# Patient Record
Sex: Male | Born: 1940 | Race: White | Hispanic: No | Marital: Married | Smoking: Former smoker
Health system: Southern US, Community
[De-identification: ages and names within clinical notes are randomized; demographics above are authoritative.]

## PROBLEM LIST (undated history)

## (undated) DIAGNOSIS — Z87891 Personal history of nicotine dependence: Secondary | ICD-10-CM

## (undated) DIAGNOSIS — K219 Gastro-esophageal reflux disease without esophagitis: Secondary | ICD-10-CM

## (undated) DIAGNOSIS — C3490 Malignant neoplasm of unspecified part of unspecified bronchus or lung: Secondary | ICD-10-CM

## (undated) DIAGNOSIS — K5792 Diverticulitis of intestine, part unspecified, without perforation or abscess without bleeding: Secondary | ICD-10-CM

## (undated) DIAGNOSIS — I10 Essential (primary) hypertension: Secondary | ICD-10-CM

## (undated) DIAGNOSIS — Z86010 Personal history of colonic polyps: Secondary | ICD-10-CM

## (undated) DIAGNOSIS — N419 Inflammatory disease of prostate, unspecified: Secondary | ICD-10-CM

## (undated) DIAGNOSIS — E876 Hypokalemia: Secondary | ICD-10-CM

## (undated) DIAGNOSIS — D751 Secondary polycythemia: Secondary | ICD-10-CM

## (undated) HISTORY — DX: Personal history of nicotine dependence: Z87.891

## (undated) HISTORY — DX: Secondary polycythemia: D75.1

## (undated) HISTORY — DX: Diverticulitis of intestine, part unspecified, without perforation or abscess without bleeding: K57.92

## (undated) HISTORY — DX: Hypokalemia: E87.6

## (undated) HISTORY — PX: LOBECTOMY: SHX5089

## (undated) HISTORY — DX: Personal history of colonic polyps: Z86.010

## (undated) HISTORY — PX: COLONOSCOPY: SHX174

## (undated) HISTORY — DX: Gastro-esophageal reflux disease without esophagitis: K21.9

## (undated) HISTORY — DX: Essential (primary) hypertension: I10

## (undated) HISTORY — DX: Inflammatory disease of prostate, unspecified: N41.9

## (undated) NOTE — *Deleted (*Deleted)
Physician Discharge Summary  Hector [name] ZOX:[id number] DOB: 1941-02-23 DOA: 02/16/2020  PCP: Pcp, No  Admit date: 02/16/2020 Discharge date: 02/19/2020  Admitted From: *** Disposition:  ***  Recommendations for Outpatient Follow-up:  1. Follow up with PCP in 1-2 weeks 2. Please obtain BMP/CBC in one week 3. Please follow up with your PCP on the following pending results: Unresulted Labs (From admission, onward)          Start     Ordered   02/18/20 0500  JAK2  [id number] Qual. with reflex to Exon 12  Tomorrow morning,   R        02/17/20 1458   02/18/20 0500  Basic metabolic panel  Daily,   R     Question:  Specimen collection method  Answer:  Lab=Lab collect   02/17/20 1930   02/18/20 0500  CBC with Differential/Platelet  Daily,   R     Question:  Specimen collection method  Answer:  Lab=Lab collect   02/17/20 1930   02/18/20 0500  Magnesium  Daily,   R     Question:  Specimen collection method  Answer:  Lab=Lab collect   02/17/20 1930   02/17/20 1458  Erythropoietin  Add-on,   AD       Question:  Specimen collection method  Answer:  Lab=Lab collect   02/17/20 1458           Home Health:***  Equipment/Devices:***   Discharge Condition:***  CODE STATUS:***  Diet recommendation: ***  Subjective:***  Brief/Interim Summary: ***  Discharge Diagnoses:  Principal Problem:   Right sided weakness Active Problems:   Polycythemia   Essential hypertension   Acute ischemic stroke (HCC)   Malignant neoplasm of left lung (HCC)   Cerebral thrombosis with cerebral infarction   Benign prostatic hyperplasia   Prediabetes   Right hemiparesis (HCC)   Dyslipidemia    Discharge Instructions   Allergies as of 02/19/2020   No Known Allergies   Med Rec must be completed prior to using this SMARTLINK***       No Known Allergies  Consultations: ***   Procedures/Studies: CT Angio Head W or Wo Contrast  Result Date: 02/17/2020 CLINICAL DATA:  Stroke-like  symptoms. Left facial numbness and dizziness. EXAM: CT ANGIOGRAPHY HEAD AND NECK TECHNIQUE: Multidetector CT imaging of the head and neck was performed using the standard protocol during bolus administration of intravenous contrast. Multiplanar CT image reconstructions and MIPs were obtained to evaluate the vascular anatomy. Carotid stenosis measurements (when applicable) are obtained utilizing NASCET criteria, using the distal internal carotid diameter as the denominator. CONTRAST:  OMNIPAQUE IOHEXOL 350 MG/ML SOLN COMPARISON:  None. FINDINGS: CT HEAD FINDINGS Brain: There is no mass, hemorrhage or extra-axial collection. There is generalized atrophy without lobar predilection. There is hypoattenuation of the periventricular white matter, most commonly indicating chronic ischemic microangiopathy. Skull: The visualized skull base, calvarium and extracranial soft tissues are normal. Sinuses/Orbits: No fluid levels or advanced mucosal thickening of the visualized paranasal sinuses. No mastoid or middle ear effusion. The orbits are normal. CTA NECK FINDINGS SKELETON: There is no bony spinal canal stenosis. No lytic or blastic lesion. OTHER NECK: Normal pharynx, larynx and major salivary glands. No cervical lymphadenopathy. Unremarkable thyroid gland. UPPER CHEST: No pneumothorax or pleural effusion. No nodules or masses. AORTIC ARCH: There is calcific atherosclerosis of the aortic arch. There is no aneurysm, dissection or hemodynamically significant stenosis of the visualized portion of the aorta. Conventional 3 vessel aortic branching pattern.  The visualized proximal subclavian arteries are widely patent. RIGHT CAROTID SYSTEM: Normal without aneurysm, dissection or stenosis. LEFT CAROTID SYSTEM: Normal without aneurysm, dissection or stenosis. VERTEBRAL ARTERIES: Left dominant configuration. Both origins are clearly patent. There is no dissection, occlusion or flow-limiting stenosis to the skull base (V1-V3  segments). CTA HEAD FINDINGS POSTERIOR CIRCULATION: --Vertebral arteries: Normal V4 segments. --Inferior cerebellar arteries: Normal. --Basilar artery: Normal. --Superior cerebellar arteries: Normal. --Posterior cerebral arteries (PCA): Normal. ANTERIOR CIRCULATION: --Intracranial internal carotid arteries: Atherosclerotic calcification of the internal carotid arteries at the skull base without hemodynamically significant stenosis. --Anterior cerebral arteries (ACA): Normal. Both A1 segments are present. Patent anterior communicating artery (a-comm). --Middle cerebral arteries (MCA): Normal. VENOUS SINUSES: As permitted by contrast timing, patent. ANATOMIC VARIANTS: Fetal origin of the right posterior cerebral artery. Review of the MIP images confirms the above findings. IMPRESSION: 1. No emergent large vessel occlusion or hemodynamically significant stenosis of the head or neck. 2. Chronic ischemic microangiopathy and generalized atrophy. Aortic Atherosclerosis (ICD10-I70.0). Electronically Signed   By: Deatra Robinson M.D.   On: 02/17/2020 03:48   CT Head Wo Contrast  Result Date: 02/16/2020 CLINICAL DATA:  Left facial burning and gait instability, slurred speech noted at 1200 hours EXAM: CT HEAD WITHOUT CONTRAST TECHNIQUE: Contiguous axial images were obtained from the base of the skull through the vertex without intravenous contrast. COMPARISON:  None. 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 No large CT evident area large vascular territory or cortically based infarction. No evidence of hemorrhage, hydrocephalus, extra-axial collection, visible mass lesion or mass effect. Symmetric prominence of the ventricles, cisterns and sulci compatible with parenchymal volume loss. Patchy areas of white matter hypoattenuation are most compatible with chronic  microvascular angiopathy. Vascular: Atherosclerotic calcification of the carotid siphons and intradural vertebral arteries. No hyperdense vessel. Skull: No calvarial fracture or suspicious osseous lesion. No scalp swelling or hematoma. Sinuses/Orbits: Mild thickening in the paranasal sinuses, left greater than right. No air-fluid levels are pneumatized secretions. Mastoid air cells are predominantly clear with pneumatization of the petrous apices. Included orbital structures are unremarkable. Other: None. IMPRESSION: 1. 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 2. No large CT evident area of large vascular territory or cortically based infarction or other acute intracranial abnormality. If there is persisting concern for acute infarction, MRI is more sensitive and specific for early features of ischemia. 3. Parenchymal volume loss and chronic microvascular angiopathy. 4. Intracranial atherosclerosis. Electronically Signed   By: Kreg Shropshire M.D.   On: 02/16/2020 15:33   CT Angio Neck W and/or Wo Contrast  Result Date: 02/17/2020 CLINICAL DATA:  Stroke-like symptoms. Left facial numbness and dizziness. EXAM: CT ANGIOGRAPHY HEAD AND NECK TECHNIQUE: Multidetector CT imaging of the head and neck was performed using the standard protocol during bolus administration of intravenous contrast. Multiplanar CT image reconstructions and MIPs were obtained to evaluate the vascular anatomy. Carotid stenosis measurements (when applicable) are obtained utilizing NASCET criteria, using the distal internal carotid diameter as the denominator. CONTRAST:  OMNIPAQUE IOHEXOL 350 MG/ML SOLN COMPARISON:  None. FINDINGS: CT HEAD FINDINGS Brain: There is no mass, hemorrhage or extra-axial collection. There is generalized atrophy without lobar predilection. There is hypoattenuation of the  periventricular white matter, most commonly indicating chronic ischemic microangiopathy. Skull: The visualized  skull base, calvarium and extracranial soft tissues are normal. Sinuses/Orbits: No fluid levels or advanced mucosal thickening of the visualized paranasal sinuses. No mastoid or middle ear effusion. The orbits are normal. CTA NECK FINDINGS SKELETON: There is no bony spinal canal stenosis. No lytic or blastic lesion. OTHER NECK: Normal pharynx, larynx and major salivary glands. No cervical lymphadenopathy. Unremarkable thyroid gland. UPPER CHEST: No pneumothorax or pleural effusion. No nodules or masses. AORTIC ARCH: There is calcific atherosclerosis of the aortic arch. There is no aneurysm, dissection or hemodynamically significant stenosis of the visualized portion of the aorta. Conventional 3 vessel aortic branching pattern. The visualized proximal subclavian arteries are widely patent. RIGHT CAROTID SYSTEM: Normal without aneurysm, dissection or stenosis. LEFT CAROTID SYSTEM: Normal without aneurysm, dissection or stenosis. VERTEBRAL ARTERIES: Left dominant configuration. Both origins are clearly patent. There is no dissection, occlusion or flow-limiting stenosis to the skull base (V1-V3 segments). CTA HEAD FINDINGS POSTERIOR CIRCULATION: --Vertebral arteries: Normal V4 segments. --Inferior cerebellar arteries: Normal. --Basilar artery: Normal. --Superior cerebellar arteries: Normal. --Posterior cerebral arteries (PCA): Normal. ANTERIOR CIRCULATION: --Intracranial internal carotid arteries: Atherosclerotic calcification of the internal carotid arteries at the skull base without hemodynamically significant stenosis. --Anterior cerebral arteries (ACA): Normal. Both A1 segments are present. Patent anterior communicating artery (a-comm). --Middle cerebral arteries (MCA): Normal. VENOUS SINUSES: As permitted by contrast timing, patent. ANATOMIC VARIANTS: Fetal origin of the right posterior cerebral artery.  Review of the MIP images confirms the above findings. IMPRESSION: 1. No emergent large vessel occlusion or hemodynamically significant stenosis of the head or neck. 2. Chronic ischemic microangiopathy and generalized atrophy. Aortic Atherosclerosis (ICD10-I70.0). Electronically Signed   By: Deatra Robinson M.D.   On: 02/17/2020 03:48   MR BRAIN WO CONTRAST  Result Date: 02/17/2020 CLINICAL DATA:  Right-sided weakness EXAM: MRI HEAD WITHOUT CONTRAST TECHNIQUE: Multiplanar, multiecho pulse sequences of the brain and surrounding structures were obtained without intravenous contrast. COMPARISON:  None. 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 No acute hemorrhage. Multifocal hyperintense T2-weighted signal within the white matter. There is generalized atrophy without lobar predilection. No chronic microhemorrhage. Normal midline structures. Vascular: Normal flow voids. Skull and upper cervical spine: Normal marrow signal. Sinuses/Orbits: Negative. Other: None. 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 No hemorrhage or mass effect. Electronically Signed   By: Deatra Robinson M.D.   On: 02/17/2020 19:08   DG Chest Portable 1 View  Result Date: 02/16/2020 CLINICAL DATA:  Dizziness EXAM: PORTABLE CHEST 1 VIEW COMPARISON:  None. FINDINGS: The heart size and mediastinal contours are within normal limits. Both lungs are clear. The visualized skeletal structures are unremarkable. IMPRESSION: No active disease. Electronically Signed   By: Alcide Clever M.D.   On: 02/16/2020 15:45   DG Swallowing Func-Speech Pathology  Result Date: 02/18/2020 Objective Swallowing Evaluation: Type of Study: MBS-Modified Barium Swallow Study  Patient Details Name: YANIEL LIMBAUGH MRN: [id number] Date of Birth: July 03, 1940 Today's Date: 02/18/2020 Time: SLP Start Time (ACUTE ONLY): 1106 -SLP Stop Time (ACUTE ONLY): 1113 SLP Time Calculation (min) (ACUTE ONLY): 7 min Past Medical History:  Past Medical History: Diagnosis Date . Diverticulitis 04/2012  admission [facility] . Former smoker   Smoked for 20 yrs;  Quit  25 yrs ago. Marland Kitchen GERD (gastroesophageal reflux disease)  . Hypertension  . Hypokalemia  . Lung cancer (HCC)  . Personal history of colonic adenoma 04/2009 . Polycythemia  . Prostatitis   S/P  TURP Past Surgical History: Past Surgical  History: Procedure Laterality Date . APPENDECTOMY  02/25/11 . COLONOSCOPY   . LOBECTOMY   . TRANSURETHRAL RESECTION OF PROSTATE  2004 HPI: 61 yo male adm [facility] with right sided weakness, also has polycythemia, lung cancer hx s/p VATS and lobectomy, colonic adenoma, insomnia, HTN.  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  Speech eval ordered.  Subjective: Pt awake, alert, pleasant, participative.  Daughter present for evaluation Assessment / Plan / Recommendation CHL IP CLINICAL IMPRESSIONS 02/18/2020 Clinical Impression Pt presents with grossly normal swallow function.  There was no penetration or aspiration with any consistencies trialed.  Oral phase was efficient and well controlled.  Swallow initiation was timely.  Recommend regular texture diet with thin liquids. No further ST needs for dysphagia.  SLP will sign off for swallowing. SLP Visit Diagnosis Dysphagia, unspecified (R13.10) Attention and concentration deficit following -- Frontal lobe and executive function deficit following -- Impact on safety and function No limitations   CHL IP TREATMENT RECOMMENDATION 02/18/2020 Treatment Recommendations No treatment recommended at this time   No flowsheet data found. CHL IP DIET RECOMMENDATION 02/18/2020 SLP Diet Recommendations Regular solids;Thin liquid Liquid Administration via Cup;Straw Medication Administration Whole meds with liquid Compensations Slow rate;Small sips/bites Postural Changes Seated upright at 90 degrees   CHL IP OTHER RECOMMENDATIONS 02/18/2020 Recommended Consults -- Oral  Care Recommendations Oral care BID Other Recommendations --   CHL IP FOLLOW UP RECOMMENDATIONS 02/18/2020 Follow up Recommendations None   CHL IP FREQUENCY AND DURATION 02/18/2020 Speech Therapy Frequency (ACUTE ONLY) (No Data) Treatment Duration --      CHL IP ORAL PHASE 02/18/2020 Oral Phase WFL Oral - Pudding Teaspoon -- Oral - Pudding Cup -- Oral - Honey Teaspoon -- Oral - Honey Cup -- Oral - Nectar Teaspoon -- Oral - Nectar Cup -- Oral - Nectar Straw -- Oral - Thin Teaspoon -- Oral - Thin Cup WFL Oral - Thin Straw WFL Oral - Puree WFL Oral - Mech Soft -- Oral - Regular WFL Oral - Multi-Consistency -- Oral - Pill WFL Oral Phase - Comment --  CHL IP PHARYNGEAL PHASE 02/18/2020 Pharyngeal Phase WFL Pharyngeal- Pudding Teaspoon -- Pharyngeal -- Pharyngeal- Pudding Cup -- Pharyngeal -- Pharyngeal- Honey Teaspoon -- Pharyngeal -- Pharyngeal- Honey Cup -- Pharyngeal -- Pharyngeal- Nectar Teaspoon -- Pharyngeal -- Pharyngeal- Nectar Cup -- Pharyngeal -- Pharyngeal- Nectar Straw -- Pharyngeal -- Pharyngeal- Thin Teaspoon -- Pharyngeal -- Pharyngeal- Thin Cup [facility] Pharyngeal Material does not enter airway Pharyngeal- Thin Straw WFL Pharyngeal Material does not enter airway Pharyngeal- Puree WFL Pharyngeal Material does not enter airway Pharyngeal- Mechanical Soft -- Pharyngeal -- Pharyngeal- Regular WFL Pharyngeal Material does not enter airway Pharyngeal- Multi-consistency -- Pharyngeal -- Pharyngeal- Pill WFL Pharyngeal Material does not enter airway Pharyngeal Comment --  CHL IP CERVICAL ESOPHAGEAL PHASE 02/18/2020 Cervical Esophageal Phase WFL Pudding Teaspoon -- Pudding Cup -- Honey Teaspoon -- Honey Cup -- Nectar Teaspoon -- Nectar Cup -- Nectar Straw -- Thin Teaspoon -- Thin Cup -- Thin Straw -- Puree -- Mechanical Soft -- Regular -- Multi-consistency -- Pill -- Cervical Esophageal Comment -- Kerrie Pleasure, MA, CCC-SLP Acute Rehabilitation Services Office: [phone] 02/18/2020, 12:09 PM               ECHOCARDIOGRAM COMPLETE  Result Date: 02/17/2020    ECHOCARDIOGRAM REPORT   Patient Name:   VENCIL BASNETT Date of Exam: 02/17/2020 Medical Rec #:  [id number]  Height:       71.0 in Accession #:    [id number]     Weight:       200.0 lb Date of Birth:  13-Aug-1940      BSA:          2.109 m Patient Age:    79 years       BP:           137/89 mmHg Patient Gender: M              HR:           68 bpm. Exam Location:  Inpatient Procedure: 2D Echo Indications:    right sided weakness  History:        Patient has no prior history of Echocardiogram examinations.                 Risk Factors:Former Smoker. Lung cancer.  Sonographer:    Celene Skeen RDCS (AE) Referring Phys: [phone] DAVID GIRGUIS  Sonographer Comments: limited mobility due to right sided weakness. no true parasternal window IMPRESSIONS  1. Left ventricular ejection fraction, by estimation, is 60 to 65%. The left ventricle has normal function. The left ventricle has no regional wall motion abnormalities. Left ventricular diastolic parameters were normal.  2. Right ventricular systolic function is normal. The right ventricular size is normal.  3. The mitral valve is normal in structure. No evidence of mitral valve regurgitation. No evidence of mitral stenosis.  4. The aortic valve is normal in structure. Aortic valve regurgitation is not visualized. No aortic stenosis is present.  5. The inferior vena cava is normal in size with greater than 50% respiratory variability, suggesting right atrial pressure of 3 mmHg. FINDINGS  Left Ventricle: Left ventricular ejection fraction, by estimation, is 60 to 65%. The left ventricle has normal function. The left ventricle has no regional wall motion abnormalities. The left ventricular internal cavity size was normal in size. There is  no left ventricular hypertrophy. Left ventricular diastolic parameters were normal. Normal left ventricular filling pressure. Right Ventricle: The right ventricular size is normal. No  increase in right ventricular wall thickness. Right ventricular systolic function is normal. Left Atrium: Left atrial size was normal in size. Right Atrium: Right atrial size was normal in size. Pericardium: There is no evidence of pericardial effusion. Mitral Valve: The mitral valve is normal in structure. No evidence of mitral valve regurgitation. No evidence of mitral valve stenosis. Tricuspid Valve: The tricuspid valve is normal in structure. Tricuspid valve regurgitation is not demonstrated. No evidence of tricuspid stenosis. Aortic Valve: The aortic valve is normal in structure. Aortic valve regurgitation is not visualized. No aortic stenosis is present. Pulmonic Valve: The pulmonic valve was not well visualized. Pulmonic valve regurgitation is not visualized. No evidence of pulmonic stenosis. Aorta: The aortic root is normal in size and structure. Venous: The inferior vena cava is normal in size with greater than 50% respiratory variability, suggesting right atrial pressure of 3 mmHg. IAS/Shunts: No atrial level shunt detected by color flow Doppler.  LEFT VENTRICLE PLAX 2D LVIDd:         3.50 cm  Diastology LVIDs:         2.20 cm  LV e' medial:    6.64 cm/s LV PW:         0.80 cm  LV E/e' medial:  8.6 LV IVS:        0.90 cm  LV e' lateral:   9.46 cm/s LVOT  diam:     2.40 cm  LV E/e' lateral: 6.1 LV SV:         76 LV SV Index:   36 LVOT Area:     4.52 cm  RIGHT VENTRICLE RV S prime:     10.60 cm/s TAPSE (M-mode): 1.6 cm LEFT ATRIUM         Index LA diam:    3.90 cm 1.85 cm/m  AORTIC VALVE LVOT Vmax:   82.90 cm/s LVOT Vmean:  63.100 cm/s LVOT VTI:    0.168 m  AORTA Ao Root diam: 3.10 cm MITRAL VALVE MV Area (PHT): 2.97 cm    SHUNTS MV Decel Time: 255 msec    Systemic VTI:  0.17 m MV E velocity: 57.40 cm/s  Systemic Diam: 2.40 cm MV A velocity: 58.30 cm/s MV E/A ratio:  0.98 Mihai Croitoru MD Electronically signed by Thurmon Fair MD Signature Date/Time: 02/17/2020/2:45:59 PM    Final       Discharge  Exam: Vitals:   02/18/20 2346 02/19/20 0332  BP: (!) 157/87 (!) 171/84  Pulse: (!) 59 68  Resp: 16 18  Temp: 98 F (36.7 C) 98 F (36.7 C)  SpO2: 96% 96%   Vitals:   02/18/20 1615 02/18/20 2000 02/18/20 2346 02/19/20 0332  BP: (!) 155/81 (!) 149/84 (!) 157/87 (!) 171/84  Pulse: 79 69 (!) 59 68  Resp: 18 18 16 18   Temp: 98.7 F (37.1 C) 98.2 F (36.8 C) 98 F (36.7 C) 98 F (36.7 C)  TempSrc: Oral Oral    SpO2: 97% 95% 96% 96%  Weight:      Height:        General: Pt is alert, awake, not in acute distress Cardiovascular: RRR, S1/S2 +, no rubs, no gallops Respiratory: CTA bilaterally, no wheezing, no rhonchi Abdominal: Soft, NT, ND, bowel sounds + Extremities: no edema, no cyanosis    The results of significant diagnostics from this hospitalization (including imaging, microbiology, ancillary and laboratory) are listed below for reference.     Microbiology: Recent Results (from the past 240 hour(s))  Respiratory Panel by RT PCR (Flu A&B, Covid) - Nasopharyngeal Swab     Status: None   Collection Time: 02/16/20  3:29 PM   Specimen: Nasopharyngeal Swab  Result Value Ref Range Status   SARS Coronavirus 2 by RT PCR NEGATIVE NEGATIVE Final    Comment: (NOTE) SARS-CoV-2 target nucleic acids are NOT DETECTED.  The SARS-CoV-2 RNA is generally detectable in upper respiratoy specimens during the acute phase of infection. The lowest concentration of SARS-CoV-2 viral copies this assay can detect is 131 copies/mL. A negative result does not preclude SARS-Cov-2 infection and should not be used as the sole basis for treatment or other patient management decisions. A negative result may occur with  improper specimen collection/handling, submission of specimen other than nasopharyngeal swab, presence of viral mutation(s) within the areas targeted by this assay, and inadequate number of viral copies (<131 copies/mL). A negative result must be combined with clinical observations,  patient history, and epidemiological information. The expected result is Negative.  Fact Sheet for Patients:  https://www.moore.com/  Fact Sheet for Healthcare Providers:  https://www.young.biz/  This test is no t yet approved or cleared by the [place] FDA and  has been authorized for detection and/or diagnosis of SARS-CoV-2 by FDA under an Emergency Use Authorization (EUA). This EUA will remain  in effect (meaning this test can be used) for the duration of the COVID-19 declaration under Section  564(b)(1) of the Act, 21 U.S.C. section 360bbb-3(b)(1), unless the authorization is terminated or revoked sooner.     Influenza A by PCR NEGATIVE NEGATIVE Final   Influenza B by PCR NEGATIVE NEGATIVE Final    Comment: (NOTE) The Xpert Xpress SARS-CoV-2/FLU/RSV assay is intended as an aid in  the diagnosis of influenza from Nasopharyngeal swab specimens and  should not be used as a sole basis for treatment. Nasal washings and  aspirates are unacceptable for Xpert Xpress SARS-CoV-2/FLU/RSV  testing.  Fact Sheet for Patients: https://www.moore.com/  Fact Sheet for Healthcare Providers: https://www.young.biz/  This test is not yet approved or cleared by the [place] FDA and  has been authorized for detection and/or diagnosis of SARS-CoV-2 by  FDA under an Emergency Use Authorization (EUA). This EUA will remain  in effect (meaning this test can be used) for the duration of the  Covid-19 declaration under Section 564(b)(1) of the Act, 21  U.S.C. section 360bbb-3(b)(1), unless the authorization is  terminated or revoked. Performed at [facility], [address] [place], [place] [address]   Urine culture     Status: None   Collection Time: 02/16/20  3:29 PM   Specimen: Urine, Clean Catch  Result Value Ref Range Status   Specimen Description   Final    URINE, CLEAN CATCH Performed at [facility], [address] [place], [place] [address]    Special Requests   Final    Normal Performed at [facility], [address] [place], [place] [address]    Culture   Final    NO GROWTH Performed at [facility], [address] [place], [place] [address]    Report Status 02/18/2020 FINAL  Final     Labs: BNP (last 3 results) No results for input(s): BNP in the last 8760 hours. Basic Metabolic Panel: Recent Labs  Lab 02/16/20 1458 02/17/20 1236 02/18/20 0124 02/19/20 0633  NA 137 138 139 137  K 3.7 4.1 3.9 3.7  CL 101 103 104 107  CO2 27 26 26 23   GLUCOSE 152* 139* 123* 122*  BUN 15 12 11 10   CREATININE 1.20 1.19 1.07 1.02  CALCIUM 9.6 9.5 9.2 8.9  MG  --   --  1.9 2.0   Liver Function Tests: Recent Labs  Lab 02/16/20 1458 02/17/20 1236  AST 23 22  ALT 26 29  ALKPHOS 55 48  BILITOT 0.6 0.9  PROT 7.2 6.6  ALBUMIN 4.3 4.0   No results for input(s): LIPASE, AMYLASE in the last 168 hours. No results for input(s): AMMONIA in the last 168 hours. CBC: Recent Labs  Lab 02/16/20 1458 02/17/20 1236 02/18/20 0124 02/19/20 0633  WBC 7.8 7.0 8.3 6.3  NEUTROABS 4.9  --  5.7 3.9  HGB 17.5* 17.0 16.2 16.2  HCT 51.6 50.0 47.8 47.8  MCV 93.5 91.7 91.9 90.9  PLT 217 181 187 179   Cardiac Enzymes: No results for input(s): CKTOTAL, CKMB, CKMBINDEX, TROPONINI in the last 168 hours. BNP: Invalid input(s): POCBNP CBG: No results for input(s): GLUCAP in the last 168 hours. D-Dimer No results for input(s): DDIMER in the last 72 hours. Hgb A1c Recent Labs    02/17/20 1236  HGBA1C 6.2*   Lipid Profile Recent Labs    02/17/20 1236  CHOL 148  HDL 35*  LDLCALC 77  TRIG 329*  CHOLHDL 4.2   Thyroid function studies Recent Labs    02/17/20  1236  TSH 1.444   Anemia work up Recent Labs    02/17/20 2040  VITAMINB12 514   Urinalysis    Component Value Date/Time   COLORURINE YELLOW 02/16/2020 1529   APPEARANCEUR CLEAR  02/16/2020 1529   APPEARANCEUR Clear 02/01/2016 1216   LABSPEC 1.010 02/16/2020 1529   PHURINE 6.0 02/16/2020 1529   GLUCOSEU NEGATIVE 02/16/2020 1529   HGBUR MODERATE (A) 02/16/2020 1529   BILIRUBINUR NEGATIVE 02/16/2020 1529   BILIRUBINUR Negative 02/01/2016 1216   KETONESUR NEGATIVE 02/16/2020 1529   PROTEINUR NEGATIVE 02/16/2020 1529   NITRITE NEGATIVE 02/16/2020 1529   LEUKOCYTESUR NEGATIVE 02/16/2020 1529   Sepsis Labs Invalid input(s): PROCALCITONIN,  WBC,  LACTICIDVEN Microbiology Recent Results (from the past 240 hour(s))  Respiratory Panel by RT PCR (Flu A&B, Covid) - Nasopharyngeal Swab     Status: None   Collection Time: 02/16/20  3:29 PM   Specimen: Nasopharyngeal Swab  Result Value Ref Range Status   SARS Coronavirus 2 by RT PCR NEGATIVE NEGATIVE Final    Comment: (NOTE) SARS-CoV-2 target nucleic acids are NOT DETECTED.  The SARS-CoV-2 RNA is generally detectable in upper respiratoy specimens during the acute phase of infection. The lowest concentration of SARS-CoV-2 viral copies this assay can detect is 131 copies/mL. A negative result does not preclude SARS-Cov-2 infection and should not be used as the sole basis for treatment or other patient management decisions. A negative result may occur with  improper specimen collection/handling, submission of specimen other than nasopharyngeal swab, presence of viral mutation(s) within the areas targeted by this assay, and inadequate number of viral copies (<131 copies/mL). A negative result must be combined with clinical observations, patient history, and epidemiological information. The expected result is Negative.  Fact Sheet for Patients:  https://www.moore.com/  Fact Sheet for Healthcare Providers:  https://www.young.biz/  This test is no t yet approved or cleared by the [place] FDA and  has been authorized for detection and/or diagnosis of SARS-CoV-2 by FDA under  an Emergency Use Authorization (EUA). This EUA will remain  in effect (meaning this test can be used) for the duration of the COVID-19 declaration under Section 564(b)(1) of the Act, 21 U.S.C. section 360bbb-3(b)(1), unless the authorization is terminated or revoked sooner.     Influenza A by PCR NEGATIVE NEGATIVE Final   Influenza B by PCR NEGATIVE NEGATIVE Final    Comment: (NOTE) The Xpert Xpress SARS-CoV-2/FLU/RSV assay is intended as an aid in  the diagnosis of influenza from Nasopharyngeal swab specimens and  should not be used as a sole basis for treatment. Nasal washings and  aspirates are unacceptable for Xpert Xpress SARS-CoV-2/FLU/RSV  testing.  Fact Sheet for Patients: https://www.moore.com/  Fact Sheet for Healthcare Providers: https://www.young.biz/  This test is not yet approved or cleared by the [place] FDA and  has been authorized for detection and/or diagnosis of SARS-CoV-2 by  FDA under an Emergency Use Authorization (EUA). This EUA will remain  in effect (meaning this test can be used) for the duration of the  Covid-19 declaration under Section 564(b)(1) of the Act, 21  U.S.C. section 360bbb-3(b)(1), unless the authorization is  terminated or revoked. Performed at [facility], [address] [place], [place] [address]   Urine culture     Status: None   Collection Time: 02/16/20  3:29 PM   Specimen: Urine, Clean Catch  Result Value Ref Range Status   Specimen Description   Final    URINE, CLEAN CATCH Performed  at [facility], [address] [place], [place] [address]    Special Requests   Final    Normal Performed at [facility], [address] [place], [place] [address]    Culture   Final    NO GROWTH Performed at [facility], [address], [place], [place] [address]    Report Status 02/18/2020 FINAL  Final     Time coordinating discharge: Over 30 minutes   SIGNED:   Hughie Closs, MD  [facility] 02/19/2020, 9:30 AM  If 7PM-7AM, please contact night-coverage [email or web address]

---

## 1999-03-04 ENCOUNTER — Ambulatory Visit (HOSPITAL_COMMUNITY): Admission: RE | Admit: 1999-03-04 | Discharge: 1999-03-04 | Payer: Self-pay | Admitting: Gastroenterology

## 1999-04-04 ENCOUNTER — Ambulatory Visit (HOSPITAL_COMMUNITY): Admission: RE | Admit: 1999-04-04 | Discharge: 1999-04-04 | Payer: Self-pay | Admitting: Gastroenterology

## 2002-01-08 ENCOUNTER — Encounter: Payer: Self-pay | Admitting: Urology

## 2002-01-10 ENCOUNTER — Inpatient Hospital Stay (HOSPITAL_COMMUNITY): Admission: RE | Admit: 2002-01-10 | Discharge: 2002-01-12 | Payer: Self-pay | Admitting: Urology

## 2002-01-10 ENCOUNTER — Encounter (INDEPENDENT_AMBULATORY_CARE_PROVIDER_SITE_OTHER): Payer: Self-pay | Admitting: Specialist

## 2002-05-08 HISTORY — PX: TRANSURETHRAL RESECTION OF PROSTATE: SHX73

## 2007-05-20 ENCOUNTER — Encounter
Admission: RE | Admit: 2007-05-20 | Discharge: 2007-06-13 | Payer: Self-pay | Admitting: Physical Medicine and Rehabilitation

## 2009-04-07 DIAGNOSIS — Z86010 Personal history of colonic polyps: Secondary | ICD-10-CM

## 2009-04-07 HISTORY — DX: Personal history of colonic polyps: Z86.010

## 2009-04-09 ENCOUNTER — Encounter (INDEPENDENT_AMBULATORY_CARE_PROVIDER_SITE_OTHER): Payer: Self-pay | Admitting: *Deleted

## 2009-04-12 ENCOUNTER — Ambulatory Visit: Payer: Self-pay | Admitting: Internal Medicine

## 2009-04-21 ENCOUNTER — Ambulatory Visit: Payer: Self-pay | Admitting: Internal Medicine

## 2009-04-27 ENCOUNTER — Encounter: Payer: Self-pay | Admitting: Internal Medicine

## 2009-11-19 ENCOUNTER — Ambulatory Visit: Payer: Self-pay | Admitting: Hematology and Oncology

## 2009-11-24 LAB — CBC WITH DIFFERENTIAL/PLATELET
Basophils Absolute: 0 10*3/uL (ref 0.0–0.1)
HCT: 51.6 % — ABNORMAL HIGH (ref 38.4–49.9)
HGB: 17.7 g/dL — ABNORMAL HIGH (ref 13.0–17.1)
LYMPH%: 20 % (ref 14.0–49.0)
MCH: 32.1 pg (ref 27.2–33.4)
MONO#: 0.6 10*3/uL (ref 0.1–0.9)
NEUT%: 67.8 % (ref 39.0–75.0)
Platelets: 208 10*3/uL (ref 140–400)
WBC: 7.1 10*3/uL (ref 4.0–10.3)
lymph#: 1.4 10*3/uL (ref 0.9–3.3)

## 2009-11-24 LAB — MORPHOLOGY: PLT EST: ADEQUATE

## 2009-11-26 LAB — IFE INTERPRETATION

## 2009-11-26 LAB — PROTEIN ELECTROPHORESIS, SERUM, WITH REFLEX
Albumin ELP: 60.8 % (ref 55.8–66.1)
Alpha-1-Globulin: 7 % — ABNORMAL HIGH (ref 2.9–4.9)
Alpha-2-Globulin: 8.2 % (ref 7.1–11.8)
Beta 2: 3.1 % — ABNORMAL LOW (ref 3.2–6.5)
Beta Globulin: 5.9 % (ref 4.7–7.2)
Gamma Globulin: 15 % (ref 11.1–18.8)
Total Protein, Serum Electrophoresis: 6.8 g/dL (ref 6.0–8.3)

## 2009-11-26 LAB — IMMUNOFIXATION ELECTROPHORESIS
IgA: 158 mg/dL (ref 68–378)
IgG (Immunoglobin G), Serum: 1200 mg/dL (ref 694–1618)
IgM, Serum: 94 mg/dL (ref 60–263)
Total Protein, Serum Electrophoresis: 6.8 g/dL (ref 6.0–8.3)

## 2009-11-26 LAB — COMPREHENSIVE METABOLIC PANEL
BUN: 17 mg/dL (ref 6–23)
CO2: 27 mEq/L (ref 19–32)
Calcium: 9.7 mg/dL (ref 8.4–10.5)
Chloride: 102 mEq/L (ref 96–112)
Creatinine, Ser: 1.12 mg/dL (ref 0.40–1.50)

## 2009-11-26 LAB — LACTATE DEHYDROGENASE: LDH: 167 U/L (ref 94–250)

## 2009-11-26 LAB — IGG, IGA, IGM
IgA: 161 mg/dL (ref 68–378)
IgM, Serum: 96 mg/dL (ref 60–263)

## 2009-11-26 LAB — FERRITIN: Ferritin: 207 ng/mL (ref 22–322)

## 2009-11-26 LAB — IRON AND TIBC
Iron: 124 ug/dL (ref 42–165)
UIBC: 223 ug/dL

## 2009-11-29 ENCOUNTER — Ambulatory Visit (HOSPITAL_COMMUNITY)
Admission: RE | Admit: 2009-11-29 | Discharge: 2009-11-29 | Payer: Self-pay | Source: Home / Self Care | Admitting: Hematology and Oncology

## 2009-11-29 LAB — JAK-2 V617F

## 2010-05-27 ENCOUNTER — Ambulatory Visit: Payer: Self-pay | Admitting: Hematology and Oncology

## 2010-05-29 ENCOUNTER — Encounter: Payer: Self-pay | Admitting: Hematology and Oncology

## 2010-05-31 LAB — CBC WITH DIFFERENTIAL/PLATELET
Basophils Absolute: 0 10*3/uL (ref 0.0–0.1)
Eosinophils Absolute: 0.1 10*3/uL (ref 0.0–0.5)
HGB: 18.2 g/dL — ABNORMAL HIGH (ref 13.0–17.1)
MCV: 91.9 fL (ref 79.3–98.0)
MONO%: 8.1 % (ref 0.0–14.0)
NEUT#: 5.4 10*3/uL (ref 1.5–6.5)
RBC: 5.83 10*6/uL — ABNORMAL HIGH (ref 4.20–5.82)
RDW: 13.1 % (ref 11.0–14.6)
WBC: 7.6 10*3/uL (ref 4.0–10.3)
lymph#: 1.4 10*3/uL (ref 0.9–3.3)

## 2010-06-01 LAB — BASIC METABOLIC PANEL
BUN: 17 mg/dL (ref 6–23)
Chloride: 98 mEq/L (ref 96–112)
Glucose, Bld: 169 mg/dL — ABNORMAL HIGH (ref 70–99)
Potassium: 3.5 mEq/L (ref 3.5–5.3)
Sodium: 140 mEq/L (ref 135–145)

## 2010-06-01 LAB — ERYTHROPOIETIN: Erythropoietin: 14.8 m[IU]/mL (ref 2.6–34.0)

## 2010-08-02 ENCOUNTER — Other Ambulatory Visit: Payer: Self-pay | Admitting: Hematology and Oncology

## 2010-08-02 ENCOUNTER — Encounter (HOSPITAL_BASED_OUTPATIENT_CLINIC_OR_DEPARTMENT_OTHER): Payer: Medicare Other | Admitting: Hematology and Oncology

## 2010-08-02 DIAGNOSIS — D45 Polycythemia vera: Secondary | ICD-10-CM

## 2010-08-02 DIAGNOSIS — I10 Essential (primary) hypertension: Secondary | ICD-10-CM

## 2010-08-02 LAB — CBC WITH DIFFERENTIAL/PLATELET
BASO%: 0.8 % (ref 0.0–2.0)
EOS%: 2.8 % (ref 0.0–7.0)
HCT: 49.2 % (ref 38.4–49.9)
MCH: 31.2 pg (ref 27.2–33.4)
MCHC: 34.6 g/dL (ref 32.0–36.0)
NEUT%: 61.9 % (ref 39.0–75.0)
lymph#: 1.9 10*3/uL (ref 0.9–3.3)

## 2010-09-23 NOTE — Op Note (Signed)
Hector [name], Hector [name]                          ACCOUNT NO.:  [id number]   MEDICAL RECORD NO.:  [id number]                   PATIENT TYPE:  INP   LOCATION:  0342                                 FACILITY:  [facility]   PHYSICIAN:  Jamison Neighbor, M.D.               DATE OF BIRTH:  [birth year]   DATE OF PROCEDURE:  01/10/2002  DATE OF DISCHARGE:  01/12/2002                                 OPERATIVE REPORT   SERVICE:  Urology.   PREOPERATIVE DIAGNOSES:  1. Benign prostatic hypertrophy with bladder outlet obstruction.  2. Bladder pain with possible interstitial cystitis.   POSTOPERATIVE DIAGNOSES:  Benign prostatic hypertrophy, bladder outlet  obstruction, and interstitial cystitis.   PROCEDURE:  Cystoscopy, TURP and hydrodistention of the bladder.   SURGEON:  Jamison Neighbor, M.D.   ANESTHESIA:  Spinal.   COMPLICATIONS:  None.   DRAINS:  A 24 French three way Foley catheter.   BRIEF HISTORY:  This 70 year old male has signs and symptoms of interstitial  cystitis, specifically he has urgency, frequency, and burning but  additionally he does bladder outlet symptoms. The patient is to undergo  evaluation for possible interstitial cystitis. If there is evidence of  bladder outlet obstruction, he would like to have that taken care of at the  same time. The patient understands the risks and benefits of the procedure  and gave full and informed consent.   DESCRIPTION OF PROCEDURE:  After successful induction of general anesthesia,  the patient was placed in the dorsal lithotomy position, prepped with  Betadine, draped in the usual sterile fashion. Cystoscopy was performed, and  was visualized in its entirety. Beyond the verumontanum, the patient had  trilobar hypertrophy with a large component of median lobe. He had marked  trabeculation. The bladder was distended at a pressure of 100 cm of water  for five minutes. There was no real glomerulation detected and it was felt  that his  symptoms are most consistent with bladder outlet obstruction and  most consistent with interstitial cystitis. The patient underwent resection  of the prostate, the median lobe was taken down beginning at the bladder  neck and extending out to the verumontanum. This was followed by resection  of the right lateral lobes starting at the 11 o'clock position and extending  down to the floor of the prostate. The left lateral lobe was resected in  identical fashion. All tissue irrigated from the bladder, adequate  hemostasis was obtained. At the end of the procedure, the bladder neck was  flat but had  not been undermined. The verumontanum and sphincter mechanism were intact,  the ureters had not been injured. The bladder was then drained with a three  way Foley catheter. The patient tolerated the procedure well and was taken  to the recovery room in good condition.  Jamison Neighbor, M.D.    RJE/MEDQ  D:  01/22/2002  T:  01/22/2002  Job:  [id number]

## 2010-11-08 ENCOUNTER — Other Ambulatory Visit: Payer: Self-pay | Admitting: Hematology and Oncology

## 2010-11-08 ENCOUNTER — Encounter (HOSPITAL_BASED_OUTPATIENT_CLINIC_OR_DEPARTMENT_OTHER): Payer: Medicare Other | Admitting: Hematology and Oncology

## 2010-11-08 DIAGNOSIS — I10 Essential (primary) hypertension: Secondary | ICD-10-CM

## 2010-11-08 DIAGNOSIS — D45 Polycythemia vera: Secondary | ICD-10-CM

## 2010-11-08 LAB — BASIC METABOLIC PANEL
BUN: 20 mg/dL (ref 6–23)
Creatinine, Ser: 1.26 mg/dL (ref 0.50–1.35)

## 2010-11-08 LAB — CBC WITH DIFFERENTIAL/PLATELET
Basophils Absolute: 0 10*3/uL (ref 0.0–0.1)
EOS%: 3.2 % (ref 0.0–7.0)
HCT: 50.4 % — ABNORMAL HIGH (ref 38.4–49.9)
HGB: 17.3 g/dL — ABNORMAL HIGH (ref 13.0–17.1)
MCH: 31.6 pg (ref 27.2–33.4)
MCV: 91.8 fL (ref 79.3–98.0)
MONO%: 6.6 % (ref 0.0–14.0)
NEUT%: 69.6 % (ref 39.0–75.0)

## 2011-02-06 ENCOUNTER — Other Ambulatory Visit (INDEPENDENT_AMBULATORY_CARE_PROVIDER_SITE_OTHER): Payer: Self-pay | Admitting: General Surgery

## 2011-02-06 ENCOUNTER — Inpatient Hospital Stay (HOSPITAL_COMMUNITY)
Admission: EM | Admit: 2011-02-06 | Discharge: 2011-02-09 | DRG: 340 | Disposition: A | Discharge status: Home / Self Care | Payer: Medicare Other | Attending: General Surgery | Admitting: General Surgery

## 2011-02-06 ENCOUNTER — Emergency Department (HOSPITAL_COMMUNITY): Payer: Medicare Other

## 2011-02-06 DIAGNOSIS — K429 Umbilical hernia without obstruction or gangrene: Secondary | ICD-10-CM | POA: Diagnosis present

## 2011-02-06 DIAGNOSIS — K352 Acute appendicitis with generalized peritonitis, without abscess: Secondary | ICD-10-CM

## 2011-02-06 DIAGNOSIS — E876 Hypokalemia: Secondary | ICD-10-CM | POA: Diagnosis not present

## 2011-02-06 DIAGNOSIS — Z79899 Other long term (current) drug therapy: Secondary | ICD-10-CM

## 2011-02-06 DIAGNOSIS — I4891 Unspecified atrial fibrillation: Secondary | ICD-10-CM | POA: Diagnosis present

## 2011-02-06 DIAGNOSIS — K219 Gastro-esophageal reflux disease without esophagitis: Secondary | ICD-10-CM | POA: Diagnosis present

## 2011-02-06 DIAGNOSIS — N400 Enlarged prostate without lower urinary tract symptoms: Secondary | ICD-10-CM | POA: Diagnosis present

## 2011-02-06 DIAGNOSIS — D45 Polycythemia vera: Secondary | ICD-10-CM | POA: Diagnosis present

## 2011-02-06 DIAGNOSIS — I499 Cardiac arrhythmia, unspecified: Secondary | ICD-10-CM | POA: Diagnosis present

## 2011-02-06 DIAGNOSIS — I10 Essential (primary) hypertension: Secondary | ICD-10-CM | POA: Diagnosis present

## 2011-02-06 DIAGNOSIS — K35209 Acute appendicitis with generalized peritonitis, without abscess, unspecified as to perforation: Principal | ICD-10-CM | POA: Diagnosis present

## 2011-02-06 LAB — POCT I-STAT, CHEM 8
Glucose, Bld: 123 mg/dL — ABNORMAL HIGH (ref 70–99)
HCT: 51 % (ref 39.0–52.0)
Hemoglobin: 17.3 g/dL — ABNORMAL HIGH (ref 13.0–17.0)
Potassium: 3.4 mEq/L — ABNORMAL LOW (ref 3.5–5.1)
Sodium: 135 mEq/L (ref 135–145)

## 2011-02-06 MED ORDER — IOHEXOL 300 MG/ML  SOLN
100.0000 mL | Freq: Once | INTRAMUSCULAR | Status: AC | PRN
  Administered 2011-02-06: 100 mL via INTRAVENOUS

## 2011-02-08 LAB — BASIC METABOLIC PANEL
BUN: 8 mg/dL (ref 6–23)
Calcium: 8.9 mg/dL (ref 8.4–10.5)
Creatinine, Ser: 1.13 mg/dL (ref 0.50–1.35)
GFR calc non Af Amer: 64 mL/min — ABNORMAL LOW (ref >90)
Glucose, Bld: 132 mg/dL — ABNORMAL HIGH (ref 70–99)
Sodium: 137 mEq/L (ref 135–145)

## 2011-02-08 LAB — CBC
HCT: 40.4 % (ref 39.0–52.0)
MCH: 30.3 pg (ref 26.0–34.0)
MCHC: 34.2 g/dL (ref 30.0–36.0)
MCV: 88.8 fL (ref 78.0–100.0)
Platelets: 219 10*3/uL (ref 150–400)
RDW: 12.9 % (ref 11.5–15.5)
WBC: 14 10*3/uL — ABNORMAL HIGH (ref 4.0–10.5)

## 2011-02-09 LAB — BASIC METABOLIC PANEL
Calcium: 9.2 mg/dL (ref 8.4–10.5)
GFR calc Af Amer: 73 mL/min — ABNORMAL LOW (ref >90)
GFR calc non Af Amer: 63 mL/min — ABNORMAL LOW (ref >90)
Potassium: 3.6 mEq/L (ref 3.5–5.1)
Sodium: 138 mEq/L (ref 135–145)

## 2011-02-09 LAB — CBC
MCH: 29.7 pg (ref 26.0–34.0)
MCHC: 33.1 g/dL (ref 30.0–36.0)
RDW: 12.8 % (ref 11.5–15.5)

## 2011-02-17 NOTE — Op Note (Signed)
NAMEAEMON, KOELLER                ACCOUNT NO.:  [id number]  MEDICAL RECORD NO.:  [id number]  LOCATION:  1523                         FACILITY:  [facility]  PHYSICIAN:  Lodema Pilot, MD       DATE OF BIRTH:  [birth year]  DATE OF PROCEDURE:  02/07/2011 DATE OF DISCHARGE:                              OPERATIVE REPORT   PROCEDURE:  Diagnostic laparoscopy with laparoscopic appendectomy and umbilical hernia repair.  PREOPERATIVE DIAGNOSIS:  Acute appendicitis.  POSTOPERATIVE DIAGNOSIS:  Ruptured appendicitis.  ANESTHESIA:  General endotracheal anesthesia with 25 cc of 1% lidocaine with epinephrine 0.25% Marcaine in a 50:50 mixture.  FLUID:  1700 cc crystalloid.  ESTIMATED BLOOD LOSS:  50 cc.  DRAINS:  None.  SPECIMENS:  Appendix sent to pathology for permanent sectioning.  COMPLICATIONS:  None apparent.  INDICATIONS FOR PROCEDURE:  Mr. Hartlage is a 70 year old male with 1-day history of generalized abdominal pain which is localized to the right lower quadrant.  He has a white count of 17,000 and a CT scan concerning for early acute appendicitis.  DETAILS:  Mr. Lenoir was seen and evaluated in the preoperative area, and risks and benefits of the procedure were again discussed in lay terms and informed consent was obtained.  Prophylactic antibiotics were given and the risks of procedures were discussed with the patient and informed consent was obtained.  He was taken to the operating room, placed on table in a supine position and general endotracheal anesthesia was obtained and his left arm was tucked at the side.  Foley catheter was placed and his abdomen was prepped and draped in a standard surgical fashion.  Then, a semicircular infraumbilical incision was made in the skin and dissection carried down to the subcutaneous tissue using blunt dissection.  Abdominal wall fascia was identified.  He had a small umbilical hernia.  The hernia sac was opened and the peritoneum  was entered using blunt dissection and 0 Vicryl sutures placed through the abdominal wall fascia and 12 mm Hasson trocar was placed through the hernia defect into the abdomen.  Pneumoperitoneum was obtained and there was no evidence of injury upon entry.  Then, a 5 mm left lower quadrant trocar was placed under direct visualization and the suprapubic trocar was placed under direct visualization.  The patient was positioned and the small bowel was retracted away from the right lower quadrant in the area of appendix.  There was some fibrinous exudate off to the cecum concerning for appendicitis.  The base of appendix was visualized and the base of the appendix was normal.  There were some inflammatory changes in the appendix, but I could not visualize the entire appendix at this time.  The terminal ileum was adhered to the appendix and the mesoappendix.  The appendix was grasped and retracted anteriorly and immediately upon grabbing appendix, there was some purulent material which was expressed from the necrotic portion in the midportion of the appendix.  A window was created at the mesoappendix and Endo-GIA stapler was passed through the window at the base of the mesoappendix, and the appendix was transected at the base.  The staple line appeared to be well formed and the staple  line was hemostatic.  Then, continued using a blunt dissection to continue to mobilize the appendix and a small amount of harmonic scalpel was used to divide the mesoappendix as well.  I was able to then completely to detach the appendix from the retroperitoneum and from its retrocecal position and the specimen was removed from the umbilical trocar in an EndoCatch bag.  The specimen was inspected on the table to ensure that we had removed the entire specimen and the appendix appeared to be removed in its entirety.  There was a necrotic portion at the midportion of the appendix which appeared perforated and from  which the purulent material was extruded during the dissection.  Then, the right lower quadrant was irrigated and inspected for hemostasis which was noted to be adequate and the purulent material was irrigated until the irrigation returned clear.  The remainder of the abdomen was inspected and suctioned from any free fluid.  There was no fecal contamination and it appeared to be just some purulent material from the midportion of the appendix.  Then, the left lower quadrant trocar was removed under direct visualization and abdominal wall was noted to be hemostatic and the umbilical trocar was removed and the umbilicus was elevated from the underlying fascia and the fascial edges were identified and were then approximated with interrupted 0 Vicryl sutures. The fascia was well approximated and the defect appeared well closed. Sutures were secured and then the abdomen was re-insufflated through the suprapubic trocar.  The umbilical fascial closure was inspected and appeared to be air tight and there was no evidence of bowel injury.  The abdominal wall was noted to be hemostatic and the right lower quadrant was again inspected and staple lines appeared be adequate.  Again, the right lower quadrant appeared to be hemostatic.  The suprapubic trocar was removed and the wounds were injected with a total of 25 cc of 1% lidocaine with epinephrine and 0.25%of Marcaine in a 50:50 mixture and the base of the umbilicus was tacked to the underlying fascia using two 3-0 Vicryl sutures and the skin edge were approximated with 4-0 Monocryl, subcuticular suture to all skin incisions.  The skin was washed and dried, and benzoin and Steri-Strips were applied.  Vacuum pressure dressing was of applied at the umbilicus and sterile dressings were applied at the other trocar sites.  Foley catheter was removed at the end of the case.  All sponge, needle, and instrument counts were correct at the end of the case.   The patient tolerated the procedure well.  The patient was stable and ready for transfer to the recovery room in stable condition.          ______________________________ Lodema Pilot, MD     BL/MEDQ  D:  02/07/2011  T:  02/07/2011  Job:  [id number]  Electronically Signed by Lodema Pilot DO on 02/17/2011 12:14:03 AM

## 2011-02-17 NOTE — H&P (Signed)
NAMEONTARIO, PETTENGILL                ACCOUNT NO.:  [id number]  MEDICAL RECORD NO.:  [id number]  LOCATION:  WLED                         FACILITY:  [facility]  PHYSICIAN:  Lodema Pilot, MD       DATE OF BIRTH:  [birth year]  DATE OF ADMISSION:  02/06/2011 DATE OF DISCHARGE:                             HISTORY & PHYSICAL   REQUESTING PROVIDER:  Payton Spark. Effie Shy, M.D., at the [facility] ER.  CHIEF COMPLAINT:  Abdominal pain.  HISTORY OF PRESENT ILLNESS:  Mr. Hofferber is a 70 year old male, who has been feeling well in the usual state of health until early this morning. He woke feeling normal and then around 8:30 this morning began having generalized lower abdominal discomfort, which he states progressed throughout the day and localized to the right lower quadrant.  He had some mild nausea and overall not feeling well, but no vomiting and no fevers or chills.  He states that his bowels are normal and denies any urinary symptoms.  He last ate this afternoon about 1 o'clock, because he thought eating with improve his symptoms, although he had no relief with eating.  He called his primary physician and was seen in their clinic this afternoon.  Laboratory studies were obtained at his primary physician's office, which demonstrated white blood cell count of 16.6 and he was sent for evaluation with CT scan and CT scan was obtained at [facility] and is concerning for acute appendicitis.  He states that his last colonoscopy was approximately 2 years ago, which demonstrated a polyp and he states that he is pretty active and had a normal stress test as well a few years ago.  Denies any chest pain.  ALLERGIES:  None.  MEDICATIONS:  Atenolol, indapamide, diazepam, Prevacid, and Cialis.  PAST MEDICAL HISTORY: 1. Hypertension and irregular heart beat. 2. BPH. 3. GERD.  PAST SURGICAL HISTORY:  TURP and oral surgery, but denied any abdominal surgery.  FAMILY HISTORY:  Not contributory.  SOCIAL  HISTORY:  He denies any tobacco use and drinks approximately the equivalent 2 to 3 drinks per night.  REVIEW OF SYSTEMS:  Otherwise, noncontributory except for the HPI.  PHYSICAL EXAMINATION:  GENERAL:  He is in no acute distress and nontoxic appearing, resting comfortably on the bed. VITAL SIGNS:  His temperature is 98.5, heart rate 78, blood pressure is 148/83, respiratory rate is 26.  He sats at 97% on room air. HEENT:  His head is normocephalic and atraumatic.  Sclerae are white. Mucous membranes are pink and moist.  His trachea is midline and no stridor. LUNGS:  Clear to auscultation bilaterally without wheezes or rales. HEART:  Rate is normal with a regular rhythm. ABDOMEN:  Soft and some mild right lower quadrant tenderness without any peritonitis.  He is nondistended and no masses appreciated.  He does have a small reducible umbilical hernia. EXTREMITIES:  Show normal strength and range of motion and normal pulses. SKIN:  Warm and dry with no obvious lesions.  LABORATORY STUDIES:  Performed at outside labs demonstrate, white blood cell count of 16.6, hemoglobin of 16.5, hematocrit of 47.8, platelets of 256.  CT scan of the abdomen demonstrates a thickened  enhancing appendix with some mild lymphadenopathy in the area concerning for acute appendicitis.  ASSESSMENT:  Abdominal pain, which is likely due to early acute appendicitis.  PLAN:  I discussed with the patient the CT findings, which are consistent with his abdominal exam and concerning for early acute appendicitis.  I recommended diagnostic laparoscopy with appendectomy and the risks of the procedure including infection, bleeding, pain, scarring, need for open surgery bowel injury, staple line leak, and potential need for ostomy or negative laparoscopy with the need for other procedures if other causes are found.  He expressed understanding and desires to proceed with diagnostic laparoscopy and appendectomy.   We will give him a gram of Invanz as well and taken to the operating room as soon as available for diagnostic laparoscopy and appendectomy.          ______________________________ Lodema Pilot, MD     BL/MEDQ  D:  02/06/2011  T:  02/07/2011  Job:  [id number]  Electronically Signed by Lodema Pilot DO on 02/17/2011 12:13:59 AM

## 2011-02-22 ENCOUNTER — Encounter (INDEPENDENT_AMBULATORY_CARE_PROVIDER_SITE_OTHER): Payer: Self-pay | Admitting: General Surgery

## 2011-02-22 ENCOUNTER — Ambulatory Visit (INDEPENDENT_AMBULATORY_CARE_PROVIDER_SITE_OTHER): Payer: Medicare Other | Admitting: General Surgery

## 2011-02-22 VITALS — BP 116/86 | HR 60 | Temp 97.0°F | Resp 16 | Ht 71.0 in | Wt 197.5 lb

## 2011-02-22 DIAGNOSIS — Z5189 Encounter for other specified aftercare: Secondary | ICD-10-CM

## 2011-02-22 DIAGNOSIS — Z4889 Encounter for other specified surgical aftercare: Secondary | ICD-10-CM

## 2011-02-22 NOTE — Progress Notes (Signed)
Subjective:     Patient ID: ZERIC BARANOWSKI, male   DOB: 1940-07-16, 70 y.o.   MRN: [id number]  HPI This patient is 2 weeks status post laparoscopic appendectomy and open umbilical hernia repair for perforated appendicitis. He has done very well. He was discharged on antibiotics for a week but is now off. He denies any pain or fevers. He has no significant tenderness at his incisions over his abdomen. His pathology was benign.  Review of Systems     Objective:   Physical Exam No acute distress and nontoxic-appearing  His abdomen is soft and nontender on exam his incisions are well-healed without signs of infection. There is no evidence of recurrent hernia at his umbilicus.    Assessment:     Status post arthroscopic appendectomy and open umbilical hernia repair. He is doing very well and has no complaints. Pathology is benign.    Plan:     He can follow up p.r.n. basis. He can increase his activities as tolerated.

## 2011-02-24 NOTE — Discharge Summary (Signed)
Hector [name], Hector [name]                ACCOUNT NO.:  [id number]  MEDICAL RECORD NO.:  [id number]  LOCATION:  1523                         FACILITY:  [facility]  PHYSICIAN:  Hector [name], M.D. DATE OF BIRTH:  07-10-1940  DATE OF ADMISSION:  02/06/2011 DATE OF DISCHARGE:  02/09/2011                              DISCHARGE SUMMARY   ADMISSION DIAGNOSES: 1. Acute appendicitis. 2. Hypertension with an irregular heartbeat. 3. Benign prostatic hypertrophy. 4. Gastroesophageal reflux disease.  DISCHARGE DIAGNOSES: 1. Ruptured appendix. 2. Umbilical hernia. 3. Hypertension. 4. Hypokalemia. 5. Gastroesophageal reflux disease. 6. Benign prostatic hypertrophy.  PROCEDURES:  Laparoscopic appendectomy and umbilical hernia repair on February 07, 2011, Dr. Biagio Quint.  BRIEF HISTORY:  The patient is a 70 year old gentleman who was feeling in his usual state of health, in the morning around 8:30, began having generalized abdominal discomfort that progressed through the day.  He had some mild nausea, but no vomiting, no fever and chills.  He was seen by his primary care that afternoon.  Laboratory studies showed a white count of 16.6.  A CT scan was subsequently obtained, which was suggestive of acute appendicitis.  He was seen in the emergency room at [facility] by Dr. Biagio Quint and admitted for appendicitis.  For further history and physical, please see the dictated note.  HOSPITAL COURSE:  The patient was admitted and taken to the OR in the early a.m. of February 07, 2011, at which time he underwent a laparoscopic appendectomy and umbilical hernia repair.  He tolerated the procedure well and returned to the floor in the early a.m.  That morning after surgery, his temperature was up to 101.4.  He was tender and distended. He was slowly mobilized, we continued antibiotics and ordered labs for the following a.m.  On February 08, 2011, his white count was 14,000, hemoglobin/hematocrit were stable,  his potassium was down to 2.7.  His abdomen was still distended.  He had a few bowel sounds and some flatus. He was not really tender.  We mobilized and continued antibiotics.  We replaced his potassium.  By the second postoperative morning, February 09, 2011, he felt much better.  He was eating a regular diet.  His white count was down to 10,600, hemoglobin 13.7, hematocrit 41, platelets 211,000, potassium was up to 3.6, BUN was 9, creatinine was 1.14, glucose was 131.  His incisions looked good.  The abdomen was nontender. He was passing flatus, but no bowel movement of any substance so far. At that point, it was Dr. Billey Chang opinion that he could be discharged home.  We want to keep him on a total of 10 days of antibiotics.  His antibiotics were not started till the late evening, so we are going to give him 8 more days of Augmentin 875/125 one b.i.d.  He can have Tylenol 650 q.4 p.r.n. or Percocet one to two q.4 p.r.n. for pain.  He will resume his preadmission atenolol 50 mg daily, Cialis 20 mg half tablet p.r.n., diazepam 1 tablet t.i.d. p.r.n., ibuprofen 2 tablets q.8 p.r.n., indapamide 2.5 mg 2 capsules daily, Pepto-Bismol p.r.n., Prevacid 30 mg daily.  He is to call our office for followup  appointment in 2 weeks with Dr. Biagio Quint.  He was instructed to call if he has any problems including fever, difficulty voiding, bleeding, redness or pain from his incision or abdominal pain. We also instructed him to contact Dr. Caryn Bee, his primary care at [facility] and let him check his potassium in couple weeks to make sure he is doing well when he goes back on his indapamide.     Eber Hong, P.A.   ______________________________ Hector [name], M.D.    WDJ/MEDQ  D:  02/09/2011  T:  02/09/2011  Job:  [id number]  cc:   Hector Kelp, Hector [name] Fax: [phone]  Electronically Signed by Sherrie George P.A. on 02/22/2011 01:08:00 PM Electronically Signed by Chevis Pretty III M.D. on  02/24/2011 09:07:52 AM

## 2011-02-25 HISTORY — PX: APPENDECTOMY: SHX54

## 2011-04-07 ENCOUNTER — Telehealth: Payer: Self-pay | Admitting: Hematology and Oncology

## 2011-04-07 NOTE — Telephone Encounter (Signed)
Pt lmonvm to cx 12/5 appt and r/s for late dec/early jan. Pt was on RJ's schedule and due to be moved to [place] per LO. Returned pt's call and lmonvm for new appt for 1/18 @ 11:30 am. Jan schedule mailed today.

## 2011-04-12 ENCOUNTER — Ambulatory Visit: Payer: Medicare Other | Admitting: Physician Assistant

## 2011-04-12 ENCOUNTER — Other Ambulatory Visit: Payer: Medicare Other | Admitting: Lab

## 2011-05-22 ENCOUNTER — Encounter: Payer: Self-pay | Admitting: *Deleted

## 2011-05-26 ENCOUNTER — Other Ambulatory Visit: Payer: Medicare Other | Admitting: Lab

## 2011-05-26 ENCOUNTER — Telehealth: Payer: Self-pay | Admitting: Hematology and Oncology

## 2011-05-26 ENCOUNTER — Ambulatory Visit (HOSPITAL_BASED_OUTPATIENT_CLINIC_OR_DEPARTMENT_OTHER): Payer: Medicare Other | Admitting: Hematology and Oncology

## 2011-05-26 VITALS — BP 125/79 | HR 71 | Temp 98.4°F | Ht 71.0 in | Wt 199.7 lb

## 2011-05-26 DIAGNOSIS — D751 Secondary polycythemia: Secondary | ICD-10-CM

## 2011-05-26 DIAGNOSIS — D45 Polycythemia vera: Secondary | ICD-10-CM

## 2011-05-26 DIAGNOSIS — I10 Essential (primary) hypertension: Secondary | ICD-10-CM

## 2011-05-26 LAB — CBC WITH DIFFERENTIAL/PLATELET
Basophils Absolute: 0 10*3/uL (ref 0.0–0.1)
EOS%: 2.5 % (ref 0.0–7.0)
Eosinophils Absolute: 0.2 10*3/uL (ref 0.0–0.5)
HGB: 15.5 g/dL (ref 13.0–17.1)
MCH: 28.1 pg (ref 27.2–33.4)
MCV: 84.3 fL (ref 79.3–98.0)
MONO%: 9.4 % (ref 0.0–14.0)
NEUT#: 4.4 10*3/uL (ref 1.5–6.5)
RBC: 5.51 10*6/uL (ref 4.20–5.82)
RDW: 14.4 % (ref 11.0–14.6)
lymph#: 1.7 10*3/uL (ref 0.9–3.3)

## 2011-05-26 LAB — BASIC METABOLIC PANEL
BUN: 19 mg/dL (ref 6–23)
Chloride: 98 mEq/L (ref 96–112)
Potassium: 3.4 mEq/L — ABNORMAL LOW (ref 3.5–5.3)
Sodium: 137 mEq/L (ref 135–145)

## 2011-05-26 NOTE — Telephone Encounter (Signed)
Gv pt appt for july2013 °

## 2011-05-26 NOTE — Progress Notes (Signed)
CC:   Francis P. Modesto Charon, M.D.  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  INTERIM HISTORY:  The patient was last seen 6 months ago.  He receives phlebotomies every 2 months through the [facility].  He was last phlebotomized last month.  He has no current concerns.  CBC on 05/16/2011 had a white count of 7, hemoglobin 15.5, hematocrit 46.5, platelets 235.  MEDICATIONS:  Medications reviewed and updated.  PHYSICAL EXAMINATION:  General:  The patient is a well-appearing, well- nourished man in no distress.  Vitals:  Pulse 71, blood pressure 125/79, temperature 98.4, respirations 20, weight 199 pounds.  HEENT:  Head is atraumatic.  Sclerae anicteric.  Mouth moist.  Chest/CVS:  Unremarkable. Abdomen:  Soft, nontender.  Bowel sounds present.  Extremities:  No calf tenderness.  LABORATORY DATA:  CBC as above.  CMET pending.  IMPRESSION AND PLAN:  Mr. 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  He also has renal cysts.  He gets phlebotomized through the [facility] every 2 months.  His current CBCs are within normal values.  I would like for him to continue with the current schedule.  He follows up in 6 months' time with labs.    ______________________________ Laurice Record, M.D. LIO/MEDQ  D:  05/26/2011  T:  05/26/2011  Job:  [id number]

## 2011-05-26 NOTE — Progress Notes (Signed)
This office note has been dictated.

## 2011-11-23 ENCOUNTER — Other Ambulatory Visit (HOSPITAL_BASED_OUTPATIENT_CLINIC_OR_DEPARTMENT_OTHER): Payer: Medicare Other | Admitting: Lab

## 2011-11-23 ENCOUNTER — Encounter: Payer: Self-pay | Admitting: Hematology and Oncology

## 2011-11-23 ENCOUNTER — Ambulatory Visit (HOSPITAL_BASED_OUTPATIENT_CLINIC_OR_DEPARTMENT_OTHER): Payer: Medicare Other | Admitting: Hematology and Oncology

## 2011-11-23 ENCOUNTER — Telehealth: Payer: Self-pay | Admitting: Hematology and Oncology

## 2011-11-23 VITALS — BP 132/80 | HR 72 | Temp 97.4°F | Ht 71.0 in | Wt 197.6 lb

## 2011-11-23 DIAGNOSIS — D751 Secondary polycythemia: Secondary | ICD-10-CM

## 2011-11-23 DIAGNOSIS — I10 Essential (primary) hypertension: Secondary | ICD-10-CM

## 2011-11-23 DIAGNOSIS — D45 Polycythemia vera: Secondary | ICD-10-CM

## 2011-11-23 DIAGNOSIS — Q619 Cystic kidney disease, unspecified: Secondary | ICD-10-CM

## 2011-11-23 LAB — BASIC METABOLIC PANEL
BUN: 20 mg/dL (ref 6–23)
CO2: 33 mEq/L — ABNORMAL HIGH (ref 19–32)
Calcium: 9.2 mg/dL (ref 8.4–10.5)
Creatinine, Ser: 1.28 mg/dL (ref 0.50–1.35)
Glucose, Bld: 232 mg/dL — ABNORMAL HIGH (ref 70–99)

## 2011-11-23 LAB — CBC WITH DIFFERENTIAL/PLATELET
Basophils Absolute: 0.1 10*3/uL (ref 0.0–0.1)
Eosinophils Absolute: 0.3 10*3/uL (ref 0.0–0.5)
HCT: 43.6 % (ref 38.4–49.9)
LYMPH%: 22.1 % (ref 14.0–49.0)
MCV: 81.7 fL (ref 79.3–98.0)
MONO#: 0.5 10*3/uL (ref 0.1–0.9)
MONO%: 7.9 % (ref 0.0–14.0)
NEUT#: 3.9 10*3/uL (ref 1.5–6.5)
NEUT%: 64.4 % (ref 39.0–75.0)
Platelets: 217 10*3/uL (ref 140–400)
WBC: 6 10*3/uL (ref 4.0–10.3)

## 2011-11-23 NOTE — Progress Notes (Signed)
CC:   Francis P. Modesto Charon, M.D.  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  INTERVAL HISTORY:  The patient was seen 6 months ago.  He has had no issues or concerns since his last followup visit.  He feels well.  He has been receiving phlebotomies every 2 months.  CBC on 11/23/2011 notes a white cell count of 6, hemoglobin 14.1, hematocrit 43.1, platelets 217.  MEDICATIONS:  Reviewed and updated.  PHYSICAL EXAM:  Patient is alert and oriented x3.  Vitals:  Pulse 72, blood pressure 132/80, temperature 97.4, respirations 20, weight 197 pounds.  Sclerae anicteric.  Chest:  Clear.  CVS:  Unremarkable. Abdomen:  Soft, nontender.  Bowel sounds present.  Extremities:  No edema.  LAB DATA:  CBC as above.  BMET pending.  IMPRESSION/PLAN:  Mr. Godbolt is a 71 year old man with mild polycythemia with renal cysts.  He will continue to get phlebotomized through the [facility], but I have increased the frequency to every 3 months.  I have him following up in 9 months' time.    ______________________________ Laurice Record, M.D. LIO/MEDQ  D:  11/23/2011  T:  11/23/2011  Job:  [id number]

## 2011-11-23 NOTE — Patient Instructions (Signed)
Hector [name]  [id number]  [facility] Discharge Instructions  RECOMMENDATIONS MADE BY THE CONSULTANT AND ANY TEST RESULTS WILL BE SENT TO YOUR REFERRING DOCTOR.   EXAM FINDINGS BY MD TODAY AND SIGNS AND SYMPTOMS TO REPORT TO CLINIC OR PRIMARY MD:   Your current list of medications are: Current Outpatient Prescriptions  Medication Sig Dispense Refill  . aspirin 81 MG tablet Take 81 mg by mouth daily.        Marland Kitchen atenolol (TENORMIN) 50 MG tablet Take 50 mg by mouth daily.       Marland Kitchen CIALIS 20 MG tablet as needed.       . diazepam (VALIUM) 5 MG tablet Take 5 mg by mouth daily as needed.      . indapamide (LOZOL) 2.5 MG tablet Take 5 mg by mouth daily.       . lansoprazole (PREVACID) 30 MG capsule Take 30 mg by mouth as needed.        . vitamin B-12 (CYANOCOBALAMIN) 1000 MCG tablet Take 1,000 mcg by mouth daily.           INSTRUCTIONS GIVEN AND DISCUSSED:   SPECIAL INSTRUCTIONS/FOLLOW-UP:  See above.  I acknowledge that I have been informed and understand all the instructions given to me and received a copy. I do not have any more questions at this time, but understand that I may call the [facility] at [phone] during business hours should I have any further questions or need assistance in obtaining follow-up care.

## 2011-11-23 NOTE — Progress Notes (Signed)
This office note has been dictated.

## 2011-11-23 NOTE — Telephone Encounter (Signed)
Gave pt appt date date for March 2014 lab and MD

## 2011-11-24 ENCOUNTER — Telehealth: Payer: Self-pay | Admitting: *Deleted

## 2011-11-24 NOTE — Telephone Encounter (Signed)
Faxed BMET results to pt's primary  Dr.  Leodis Sias to advise pt with low K+ level.

## 2011-11-24 NOTE — Telephone Encounter (Signed)
Dr.  Leodis Sias     Phone     [phone]     ;     Fax      [phone].

## 2011-11-24 NOTE — Telephone Encounter (Signed)
No entry 

## 2012-03-22 ENCOUNTER — Encounter: Payer: Self-pay | Admitting: Internal Medicine

## 2012-04-07 DIAGNOSIS — K5792 Diverticulitis of intestine, part unspecified, without perforation or abscess without bleeding: Secondary | ICD-10-CM

## 2012-04-07 HISTORY — DX: Diverticulitis of intestine, part unspecified, without perforation or abscess without bleeding: K57.92

## 2012-06-08 ENCOUNTER — Telehealth: Payer: Self-pay | Admitting: Hematology and Oncology

## 2012-06-26 ENCOUNTER — Encounter: Payer: Self-pay | Admitting: Internal Medicine

## 2012-07-23 ENCOUNTER — Ambulatory Visit: Payer: Medicare Other | Admitting: Hematology and Oncology

## 2012-07-23 ENCOUNTER — Other Ambulatory Visit: Payer: Medicare Other | Admitting: Lab

## 2012-07-25 ENCOUNTER — Ambulatory Visit (AMBULATORY_SURGERY_CENTER): Payer: Medicare Other | Admitting: *Deleted

## 2012-07-25 VITALS — Ht 71.0 in | Wt 198.4 lb

## 2012-07-25 DIAGNOSIS — Z1211 Encounter for screening for malignant neoplasm of colon: Secondary | ICD-10-CM

## 2012-07-25 MED ORDER — NA SULFATE-K SULFATE-MG SULF 17.5-3.13-1.6 GM/177ML PO SOLN: ORAL | Status: DC

## 2012-08-08 ENCOUNTER — Encounter: Payer: Self-pay | Admitting: Internal Medicine

## 2012-08-08 ENCOUNTER — Ambulatory Visit (AMBULATORY_SURGERY_CENTER): Payer: Medicare Other | Admitting: Internal Medicine

## 2012-08-08 VITALS — BP 127/82 | HR 61 | Temp 96.5°F | Resp 16 | Ht 71.0 in | Wt 198.0 lb

## 2012-08-08 DIAGNOSIS — D126 Benign neoplasm of colon, unspecified: Secondary | ICD-10-CM

## 2012-08-08 DIAGNOSIS — K5730 Diverticulosis of large intestine without perforation or abscess without bleeding: Secondary | ICD-10-CM

## 2012-08-08 DIAGNOSIS — Z1211 Encounter for screening for malignant neoplasm of colon: Secondary | ICD-10-CM

## 2012-08-08 DIAGNOSIS — Z860100 Personal history of colon polyps, unspecified: Secondary | ICD-10-CM

## 2012-08-08 MED ORDER — SODIUM CHLORIDE 0.9 % IV SOLN: 500.0000 mL | INTRAVENOUS | Status: DC

## 2012-08-08 NOTE — Patient Instructions (Addendum)
One tiny polyp was removed and you have diverticulosis. Next colonoscopy most likely in 5 years.  I will let you know pathology results and when to have another routine colonoscopy by mail.  Thank you for choosing me and [facility].  Iva Boop, MD, [facility]  Resume current medications.   Handouts given on polyps,diverticulosis. Call us with any questions or concerns. Thank you!!  YOU HAD AN ENDOSCOPIC PROCEDURE TODAY AT THE [facility]: Refer to the procedure report that was given to you for any specific questions about what was found during the examination.  If the procedure report does not answer your questions, please call your gastroenterologist to clarify.  If you requested that your care partner not be given the details of your procedure findings, then the procedure report has been included in a sealed envelope for you to review at your convenience later.  YOU SHOULD EXPECT: Some feelings of bloating in the abdomen. Passage of more gas than usual.  Walking can help get rid of the air that was put into your GI tract during the procedure and reduce the bloating. If you had a lower endoscopy (such as a colonoscopy or flexible sigmoidoscopy) you may notice spotting of blood in your stool or on the toilet paper. If you underwent a bowel prep for your procedure, then you may not have a normal bowel movement for a few days.  DIET: Your first meal following the procedure should be a light meal and then it is ok to progress to your normal diet.  A half-sandwich or bowl of soup is an example of a good first meal.  Heavy or fried foods are harder to digest and may make you feel nauseous or bloated.  Likewise meals heavy in dairy and vegetables can cause extra gas to form and this can also increase the bloating.  Drink plenty of fluids but you should avoid alcoholic beverages for 24 hours.  ACTIVITY: Your care partner should take you home directly after the procedure.  You  should plan to take it easy, moving slowly for the rest of the day.  You can resume normal activity the day after the procedure however you should NOT DRIVE or use heavy machinery for 24 hours (because of the sedation medicines used during the test).    SYMPTOMS TO REPORT IMMEDIATELY: A gastroenterologist can be reached at any hour.  During normal business hours, 8:30 AM to 5:00 PM Monday through Friday, call [phone].  After hours and on weekends, please call the GI answering service at [phone] who will take a message and have the physician on call contact you.   Following lower endoscopy (colonoscopy or flexible sigmoidoscopy):  Excessive amounts of blood in the stool  Significant tenderness or worsening of abdominal pains  Swelling of the abdomen that is new, acute  Fever of 100F or higher  Following upper endoscopy (EGD)  Vomiting of blood or coffee ground material  New chest pain or pain under the shoulder blades  Painful or persistently difficult swallowing  New shortness of breath  Fever of 100F or higher  Black, tarry-looking stools  FOLLOW UP: If any biopsies were taken you will be contacted by phone or by letter within the next 1-3 weeks.  Call your gastroenterologist if you have not heard about the biopsies in 3 weeks.  Our staff will call the home number listed on your records the next business day following your procedure to check on you and address any  questions or concerns that you may have at that time regarding the information given to you following your procedure. This is a courtesy call and so if there is no answer at the home number and we have not heard from you through the emergency physician on call, we will assume that you have returned to your regular daily activities without incident.  SIGNATURES/CONFIDENTIALITY: You and/or your care partner have signed paperwork which will be entered into your electronic medical record.  These signatures attest to  the fact that that the information above on your After Visit Summary has been reviewed and is understood.  Full responsibility of the confidentiality of this discharge information lies with you and/or your care-partner.

## 2012-08-08 NOTE — Progress Notes (Signed)
Patient did not experience any of the following events: a burn prior to discharge; a fall within the facility; wrong site/side/patient/procedure/implant event; or a hospital transfer or hospital admission upon discharge from the facility. (G8907) Patient did not have preoperative order for IV antibiotic SSI prophylaxis. (G8918)  

## 2012-08-08 NOTE — Progress Notes (Signed)
Called to room to assist during endoscopic procedure.  Patient ID and intended procedure confirmed with present staff. Received instructions for my participation in the procedure from the performing physician.  

## 2012-08-08 NOTE — Op Note (Signed)
[facility] [address] [place], [address]   COLONOSCOPY PROCEDURE REPORT  PATIENT: Hector [name], Hector [name]  MR#: [id number] BIRTHDATE: 1941-04-12 , 71  yrs. old GENDER: Male ENDOSCOPIST: Iva Boop, MD, [facility] PROCEDURE DATE:  08/08/2012 PROCEDURE:   Colonoscopy with biopsy ASA CLASS:   Class II INDICATIONS:Screening and surveillance,personal history of colonic polyps. MEDICATIONS: propofol (Diprivan) 200mg  IV, MAC sedation, administered by CRNA, and These medications were titrated to patient response per physician's verbal order  DESCRIPTION OF PROCEDURE:   After the risks benefits and alternatives of the procedure were thoroughly explained, informed consent was obtained.  A digital rectal exam revealed no abnormalities of the rectum, A digital rectal exam revealed no prostatic nodules, and A digital rectal exam revealed the prostate was not enlarged.   The LB [id number]-[id number]  endoscope was introduced through the anus and advanced to the cecum, which was identified by both the appendix and ileocecal valve. No adverse events experienced.   The quality of the prep was Suprep excellent The instrument was then slowly withdrawn as the colon was fully examined.      COLON FINDINGS: A polypoid shaped sessile polyp measuring 2 mm in size was found in the ascending colon.  A polypectomy was performed with cold forceps.  The resection was complete and the polyp tissue was completely retrieved.   Moderate diverticulosis was noted in the sigmoid colon.   The colon mucosa was otherwise normal.   A right colon retroflexion was performed.  Retroflexed views revealed no abnormalities. The time to cecum=2 minutes 07 seconds. Withdrawal time=13 minutes 16 seconds.  The scope was withdrawn and the procedure completed. COMPLICATIONS: There were no complications.  ENDOSCOPIC IMPRESSION: 1.   Sessile polyp measuring 2 mm in size was found in the ascending colon;  polypectomy was performed with cold forceps 2.   Moderate diverticulosis was noted in the sigmoid colon 3.   The colon mucosa was otherwise normal - excellent prep  RECOMMENDATIONS: Timing of repeat colonoscopy will be determined by pathology findings in patient with 15 mm TV adenoma removal 2010   eSigned:  Iva Boop, MD, [facility] 08/08/2012 9:41 AM  cc: Helene Kelp, PA and The Patient

## 2012-08-08 NOTE — Progress Notes (Signed)
A/o x 3 pleased with MAC report to [employer]

## 2012-08-09 ENCOUNTER — Telehealth: Payer: Self-pay | Admitting: *Deleted

## 2012-08-09 NOTE — Telephone Encounter (Signed)
  Follow up Call-  Call back number 08/08/2012  Post procedure Call Back phone  # [phone]  Permission to leave phone message Yes     Patient questions:  Do you have a fever, pain , or abdominal swelling? no Pain Score  0 *  Have you tolerated food without any problems? yes  Have you been able to return to your normal activities? yes  Do you have any questions about your discharge instructions: Diet   no Medications  no Follow up visit  no  Do you have questions or concerns about your Care? no  Actions: * If pain score is 4 or above: No action needed, pain <4.

## 2012-08-13 ENCOUNTER — Encounter: Payer: Self-pay | Admitting: Internal Medicine

## 2012-08-13 NOTE — Progress Notes (Signed)
Quick Note:  2mm adenoma Repeat colonoscopy about 08/2017 ______

## 2012-08-15 ENCOUNTER — Other Ambulatory Visit: Payer: Self-pay | Admitting: *Deleted

## 2012-08-15 MED ORDER — ATENOLOL 50 MG PO TABS: 50.0000 mg | ORAL_TABLET | Freq: Every day | ORAL | Status: DC

## 2012-08-15 MED ORDER — POTASSIUM CHLORIDE ER 10 MEQ PO TBCR: 10.0000 meq | EXTENDED_RELEASE_TABLET | Freq: Every day | ORAL | Status: DC

## 2012-08-15 NOTE — Telephone Encounter (Signed)
LAST K 4.2  ON 3/14

## 2012-11-05 ENCOUNTER — Other Ambulatory Visit: Payer: Self-pay | Admitting: Nurse Practitioner

## 2012-11-05 MED ORDER — POTASSIUM CHLORIDE ER 10 MEQ PO TBCR: 10.0000 meq | EXTENDED_RELEASE_TABLET | Freq: Every day | ORAL | Status: DC

## 2012-11-05 MED ORDER — ATENOLOL 50 MG PO TABS: 50.0000 mg | ORAL_TABLET | Freq: Every day | ORAL | Status: DC

## 2012-11-11 ENCOUNTER — Telehealth: Payer: Self-pay | Admitting: Physician Assistant

## 2012-11-11 NOTE — Telephone Encounter (Signed)
Appt given for tomorrow

## 2012-11-12 ENCOUNTER — Encounter: Payer: Self-pay | Admitting: General Practice

## 2012-11-12 ENCOUNTER — Other Ambulatory Visit: Payer: Self-pay | Admitting: Nurse Practitioner

## 2012-11-12 ENCOUNTER — Ambulatory Visit (INDEPENDENT_AMBULATORY_CARE_PROVIDER_SITE_OTHER): Payer: Medicare Other | Admitting: General Practice

## 2012-11-12 VITALS — BP 135/78 | HR 67 | Temp 99.4°F | Ht 71.0 in | Wt 200.0 lb

## 2012-11-12 DIAGNOSIS — H6690 Otitis media, unspecified, unspecified ear: Secondary | ICD-10-CM

## 2012-11-12 DIAGNOSIS — H6692 Otitis media, unspecified, left ear: Secondary | ICD-10-CM

## 2012-11-12 MED ORDER — CIPROFLOXACIN-DEXAMETHASONE 0.3-0.1 % OT SUSP: 4.0000 [drp] | Freq: Two times a day (BID) | OTIC | Status: DC

## 2012-11-12 MED ORDER — AMOXICILLIN 500 MG PO CAPS: 500.0000 mg | ORAL_CAPSULE | Freq: Three times a day (TID) | ORAL | Status: DC

## 2012-11-12 NOTE — Patient Instructions (Signed)

## 2012-11-12 NOTE — Progress Notes (Signed)
  Subjective:    Patient ID: Hector [name], male    DOB: 1940-10-26, 72 y.o.   MRN: [id number]  Otalgia  There is pain in the left ear. This is a recurrent problem. The current episode started 1 to 4 weeks ago. The problem occurs constantly. The problem has been gradually worsening. There has been no fever. The pain is at a severity of 0/10. The patient is experiencing no pain. Pertinent negatives include no coughing, drainage, ear discharge, headaches, hearing loss, rhinorrhea or sore throat. He has tried nothing for the symptoms.      Review of Systems  Constitutional: Negative for fever and chills.  HENT: Positive for ear pain. Negative for hearing loss, congestion, sore throat, rhinorrhea, sneezing, sinus pressure and ear discharge.   Respiratory: Negative for cough and chest tightness.   Neurological: Negative for dizziness, weakness and headaches.       Objective:   Physical Exam  Constitutional: He is oriented to person, place, and time. He appears well-developed and well-nourished.  HENT:  Head: Normocephalic and atraumatic.  Right Ear: External ear normal.  Left Ear: Tympanic membrane is erythematous.  Mouth/Throat: Oropharynx is clear and moist.  Cardiovascular: Normal rate, regular rhythm and normal heart sounds.   Pulmonary/Chest: Effort normal and breath sounds normal. No respiratory distress. He exhibits no tenderness.  Neurological: He is alert and oriented to person, place, and time.  Skin: Skin is warm and dry.  Psychiatric: He has a normal mood and affect.          Assessment & Plan:  1. Otitis media, left - amoxicillin (AMOXIL) 500 MG capsule; Take 1 capsule (500 mg total) by mouth 3 (three) times daily.  Dispense: 30 capsule; Refill: 0 - ciprofloxacin-dexamethasone (CIPRODEX) otic suspension; Place 4 drops into the left ear 2 (two) times daily.  Dispense: 7.5 mL; Refill: 0 -refrain from placing objects in ear -RTO if symptoms worsen or unresolved -Patient  verbalized understanding -Coralie Keens, FNP-C

## 2013-01-26 ENCOUNTER — Other Ambulatory Visit: Payer: Self-pay | Admitting: Nurse Practitioner

## 2013-02-13 ENCOUNTER — Encounter: Payer: Self-pay | Admitting: Family Medicine

## 2013-02-13 ENCOUNTER — Encounter (INDEPENDENT_AMBULATORY_CARE_PROVIDER_SITE_OTHER): Payer: Self-pay

## 2013-02-13 ENCOUNTER — Ambulatory Visit (INDEPENDENT_AMBULATORY_CARE_PROVIDER_SITE_OTHER): Payer: Medicare Other | Admitting: Family Medicine

## 2013-02-13 VITALS — BP 146/87 | HR 59 | Temp 97.9°F | Ht 71.0 in | Wt 198.0 lb

## 2013-02-13 DIAGNOSIS — E785 Hyperlipidemia, unspecified: Secondary | ICD-10-CM

## 2013-02-13 DIAGNOSIS — Z23 Encounter for immunization: Secondary | ICD-10-CM

## 2013-02-13 DIAGNOSIS — I10 Essential (primary) hypertension: Secondary | ICD-10-CM

## 2013-02-13 DIAGNOSIS — N400 Enlarged prostate without lower urinary tract symptoms: Secondary | ICD-10-CM

## 2013-02-13 DIAGNOSIS — F411 Generalized anxiety disorder: Secondary | ICD-10-CM

## 2013-02-13 DIAGNOSIS — K219 Gastro-esophageal reflux disease without esophagitis: Secondary | ICD-10-CM

## 2013-02-13 LAB — POCT CBC
Granulocyte percent: 77.7 %G (ref 37–80)
HCT, POC: 55.7 % — AB (ref 43.5–53.7)
Hemoglobin: 18.9 g/dL — AB (ref 14.1–18.1)
Lymph, poc: 1.6 (ref 0.6–3.4)
MCH, POC: 30.8 pg (ref 27–31.2)
MCHC: 33.9 g/dL (ref 31.8–35.4)
MCV: 90.8 fL (ref 80–97)
MPV: 8.6 fL (ref 0–99.8)
POC Granulocyte: 5.7 (ref 2–6.9)
POC LYMPH PERCENT: 21.2 %L (ref 10–50)
Platelet Count, POC: 193 10*3/uL (ref 142–424)
RBC: 6.1 M/uL (ref 4.69–6.13)
RDW, POC: 13 %
WBC: 7.4 10*3/uL (ref 4.6–10.2)

## 2013-02-13 MED ORDER — ATENOLOL 50 MG PO TABS: 50.0000 mg | ORAL_TABLET | Freq: Every day | ORAL | Status: DC

## 2013-02-13 MED ORDER — LANSOPRAZOLE 30 MG PO CPDR: 30.0000 mg | DELAYED_RELEASE_CAPSULE | Freq: Every day | ORAL | Status: DC

## 2013-02-13 MED ORDER — DIAZEPAM 5 MG PO TABS: 5.0000 mg | ORAL_TABLET | Freq: Every day | ORAL | Status: DC | PRN

## 2013-02-13 NOTE — Progress Notes (Signed)
  Subjective:    Patient ID: Hector [name], male    DOB: 1940-10-09, 72 y.o.   MRN: [id number]  HPI This 72 y.o. male presents for evaluation of hypertension, hyperlipidemia, diabetes,and bph. He sees a urologist.  He sees a [facility] on occasion.  He needs refills.  He is due for labs.   Review of Systems No chest pain, SOB, HA, dizziness, vision change, N/V, diarrhea, constipation, dysuria, urinary urgency or frequency, myalgias, arthralgias or rash.     Objective:   Physical Exam Vital signs noted  Well developed well nourished male.  HEENT - Head atraumatic Normocephalic                Eyes - PERRLA, Conjuctiva - clear Sclera- Clear EOMI                Ears - EAC's Wnl TM's Wnl Gross Hearing WNL                Nose - Nares patent                 Throat - oropharanx wnl Respiratory - Lungs CTA bilateral Cardiac - RRR S1 and S2 without murmur GI - Abdomen soft Nontender and bowel sounds active x 4 Extremities - No edema. Neuro - Grossly intact.       Assessment & Plan:  Other and unspecified hyperlipidemia  Anxiety state, unspecified - Plan: diazepam (VALIUM) 5 MG tablet  Essential hypertension, benign - Plan: atenolol (TENORMIN) 50 MG tablet, POCT CBC, CMP14+EGFR, Thyroid Panel With TSH  BPH (benign prostatic hyperplasia) - Plan: PSA, total and free  GERD (gastroesophageal reflux disease) - Plan: lansoprazole (PREVACID) 30 MG capsule  Need for prophylactic vaccination and inoculation against influenza  Deatra Canter FNP

## 2013-02-13 NOTE — Patient Instructions (Signed)

## 2013-02-14 ENCOUNTER — Telehealth: Payer: Self-pay | Admitting: Family Medicine

## 2013-02-14 LAB — CMP14+EGFR
ALT: 18 IU/L (ref 0–44)
AST: 18 IU/L (ref 0–40)
Albumin/Globulin Ratio: 1.9 (ref 1.1–2.5)
Albumin: 4.4 g/dL (ref 3.5–4.8)
Alkaline Phosphatase: 74 IU/L (ref 39–117)
BUN/Creatinine Ratio: 17 (ref 10–22)
BUN: 21 mg/dL (ref 8–27)
CO2: 24 mmol/L (ref 18–29)
Calcium: 9.9 mg/dL (ref 8.6–10.2)
Chloride: 97 mmol/L (ref 97–108)
Creatinine, Ser: 1.21 mg/dL (ref 0.76–1.27)
GFR calc Af Amer: 69 mL/min/{1.73_m2} (ref >59)
GFR calc non Af Amer: 59 mL/min/{1.73_m2} — ABNORMAL LOW (ref >59)
Globulin, Total: 2.3 g/dL (ref 1.5–4.5)
Glucose: 123 mg/dL — ABNORMAL HIGH (ref 65–99)
Potassium: 4 mmol/L (ref 3.5–5.2)
Sodium: 140 mmol/L (ref 134–144)
Total Bilirubin: 0.8 mg/dL (ref 0.0–1.2)
Total Protein: 6.7 g/dL (ref 6.0–8.5)

## 2013-02-14 LAB — PSA, TOTAL AND FREE
PSA, Free Pct: 28.5 %
PSA, Free: 0.37 ng/mL
PSA: 1.3 ng/mL (ref 0.0–4.0)

## 2013-02-14 LAB — THYROID PANEL WITH TSH
Free Thyroxine Index: 1.6 (ref 1.2–4.9)
T3 Uptake Ratio: 26 % (ref 24–39)
T4, Total: 6 ug/dL (ref 4.5–12.0)
TSH: 1.28 u[IU]/mL (ref 0.450–4.500)

## 2013-02-17 NOTE — Telephone Encounter (Signed)
Discussed results with patient

## 2013-02-25 ENCOUNTER — Other Ambulatory Visit: Payer: Self-pay | Admitting: Family Medicine

## 2013-05-30 ENCOUNTER — Ambulatory Visit: Payer: Medicare Other | Admitting: Urology

## 2013-06-20 ENCOUNTER — Encounter: Payer: Self-pay | Admitting: Family Medicine

## 2013-06-20 ENCOUNTER — Ambulatory Visit (INDEPENDENT_AMBULATORY_CARE_PROVIDER_SITE_OTHER): Payer: Medicare Other | Admitting: Family Medicine

## 2013-06-20 VITALS — BP 144/88 | HR 77 | Temp 99.5°F | Ht 71.0 in | Wt 201.6 lb

## 2013-06-20 DIAGNOSIS — J329 Chronic sinusitis, unspecified: Secondary | ICD-10-CM

## 2013-06-20 MED ORDER — AMOXICILLIN 875 MG PO TABS: 875.0000 mg | ORAL_TABLET | Freq: Two times a day (BID) | ORAL | Status: DC

## 2013-06-20 NOTE — Progress Notes (Signed)
   Subjective:    Patient ID: Hector [name], male    DOB: Apr 05, 1941, 73 y.o.   MRN: [id number]  HPI  This 73 y.o. male presents for evaluation of facial pain and halotosis and mucopurulent sinus drainage.  Review of Systems    No chest pain, SOB, HA, dizziness, vision change, N/V, diarrhea, constipation, dysuria, urinary urgency or frequency, myalgias, arthralgias or rash.  Objective:   Physical Exam Vital signs noted  Well developed well nourished male.  HEENT - Head atraumatic Normocephalic                Eyes - PERRLA, Conjuctiva - clear Sclera- Clear EOMI                Ears - EAC's Wnl TM's Wnl Gross Hearing WNL                Nose - Nares patent                 Throat - oropharanx wnl Respiratory - Lungs CTA bilateral Cardiac - RRR S1 and S2 without murmur GI - Abdomen soft Nontender and bowel sounds active x 4 Extremities - No edema. Neuro - Grossly intact.      Assessment & Plan:  Sinusitis - Plan: amoxicillin (AMOXIL) 875 MG tablet po bid 2 weeks Push po fluids, rest, tylenol and motrin otc prn as directed for fever, arthralgias, and myalgias.  Follow up prn if sx's continue or persist.  Lysbeth Penner FNP

## 2013-06-27 ENCOUNTER — Ambulatory Visit (INDEPENDENT_AMBULATORY_CARE_PROVIDER_SITE_OTHER): Payer: Medicare Other

## 2013-06-27 ENCOUNTER — Encounter: Payer: Self-pay | Admitting: General Practice

## 2013-06-27 ENCOUNTER — Ambulatory Visit (INDEPENDENT_AMBULATORY_CARE_PROVIDER_SITE_OTHER): Payer: Medicare Other | Admitting: General Practice

## 2013-06-27 VITALS — BP 130/80 | HR 71 | Temp 97.6°F | Ht 71.0 in | Wt 192.5 lb

## 2013-06-27 DIAGNOSIS — R5383 Other fatigue: Secondary | ICD-10-CM

## 2013-06-27 DIAGNOSIS — R5381 Other malaise: Secondary | ICD-10-CM

## 2013-06-27 DIAGNOSIS — K5900 Constipation, unspecified: Secondary | ICD-10-CM

## 2013-06-27 DIAGNOSIS — R1031 Right lower quadrant pain: Secondary | ICD-10-CM

## 2013-06-27 DIAGNOSIS — R634 Abnormal weight loss: Secondary | ICD-10-CM

## 2013-06-27 LAB — POCT CBC
Granulocyte percent: 72.2 %G (ref 37–80)
HEMATOCRIT: 53.6 % (ref 43.5–53.7)
HEMOGLOBIN: 17.6 g/dL (ref 14.1–18.1)
Lymph, poc: 1.4 (ref 0.6–3.4)
MCH: 29.5 pg (ref 27–31.2)
MCHC: 32.9 g/dL (ref 31.8–35.4)
MCV: 89.7 fL (ref 80–97)
MPV: 8.8 fL (ref 0–99.8)
POC Granulocyte: 4.3 (ref 2–6.9)
POC LYMPH PERCENT: 24 %L (ref 10–50)
Platelet Count, POC: 197 10*3/uL (ref 142–424)
RBC: 6 M/uL (ref 4.69–6.13)
RDW, POC: 13.2 %
WBC: 6 10*3/uL (ref 4.6–10.2)

## 2013-06-27 NOTE — Progress Notes (Signed)
   Subjective:    Patient ID: Hector [name], male    DOB: March 29, 1941, 73 y.o.   MRN: [id number]  HPI Patient presents today with complaints of fatigue, malaise, weight loss, loss of appetite, and fever. Reports onset of symptoms was 4 days ago. Taking amoxicillin for sinusitis since Friday. Fever of 101 on Saturday, but non since then. Diarrhea on Tuesday, 3 doses of kaeopectate.     Review of Systems  Constitutional: Negative for fever and chills.  Respiratory: Negative for chest tightness and shortness of breath.   Cardiovascular: Negative for chest pain and palpitations.  Gastrointestinal: Negative for nausea, vomiting, abdominal pain, diarrhea, constipation and blood in stool.  Genitourinary: Negative for dysuria, hematuria and difficulty urinating.  Neurological: Negative for dizziness, weakness and headaches.       Objective:   Physical Exam  Constitutional: He is oriented to person, place, and time. He appears well-developed and well-nourished.  Cardiovascular: Normal rate, regular rhythm and normal heart sounds.   Pulmonary/Chest: Effort normal and breath sounds normal. No respiratory distress. He exhibits no tenderness.  Abdominal: Soft. Bowel sounds are normal. He exhibits no distension. There is no tenderness.  Neurological: He is alert and oriented to person, place, and time.  Skin: Skin is warm and dry.  Psychiatric: He has a normal mood and affect.      [facility] reading (PRIMARY) by Erby Pian, FNP-C, moderate stool noted in colon.    Assessment & Plan:  1. Right lower quadrant pain  - DG Abd 1 View; Future  2. Fatigue   3. Malaise and fatigue  - POCT CBC - CMP14+EGFR - Vitamin B12 - Thyroid Panel With TSH  4. Loss of weight  - POCT CBC - CMP14+EGFR - Vitamin B12 - Thyroid Panel With TSH  5. Unspecified constipation -Miralax 17grams daily, for 1-4 days, until bowel movement  Increase fluid intake (water) Increase fiber in diet (fruits,  vegetables, whole grains) Take stool softner daily RTO if symptoms worsen or unresolved Patient verbalized understanding Erby Pian, FNP-C

## 2013-06-27 NOTE — Patient Instructions (Signed)
Constipation, Adult Constipation is when a person has fewer than 3 bowel movements a week; has difficulty having a bowel movement; or has stools that are dry, hard, or larger than normal. As people grow older, constipation is more common. If you try to fix constipation with medicines that make you have a bowel movement (laxatives), the problem may get worse. Long-term laxative use may cause the muscles of the colon to become weak. A low-fiber diet, not taking in enough fluids, and taking certain medicines may make constipation worse. CAUSES   Certain medicines, such as antidepressants, pain medicine, iron supplements, antacids, and water pills.   Certain diseases, such as diabetes, irritable bowel syndrome (IBS), thyroid disease, or depression.   Not drinking enough water.   Not eating enough fiber-rich foods.   Stress or travel.  Lack of physical activity or exercise.  Not going to the restroom when there is the urge to have a bowel movement.  Ignoring the urge to have a bowel movement.  Using laxatives too much. SYMPTOMS   Having fewer than 3 bowel movements a week.   Straining to have a bowel movement.   Having hard, dry, or larger than normal stools.   Feeling full or bloated.   Pain in the lower abdomen.  Not feeling relief after having a bowel movement. DIAGNOSIS  Your caregiver will take a medical history and perform a physical exam. Further testing may be done for severe constipation. Some tests may include:   A barium enema X-ray to examine your rectum, colon, and sometimes, your small intestine.  A sigmoidoscopy to examine your lower colon.  A colonoscopy to examine your entire colon. TREATMENT  Treatment will depend on the severity of your constipation and what is causing it. Some dietary treatments include drinking more fluids and eating more fiber-rich foods. Lifestyle treatments may include regular exercise. If these diet and lifestyle recommendations  do not help, your caregiver may recommend taking over-the-counter laxative medicines to help you have bowel movements. Prescription medicines may be prescribed if over-the-counter medicines do not work.  HOME CARE INSTRUCTIONS   Increase dietary fiber in your diet, such as fruits, vegetables, whole grains, and beans. Limit high-fat and processed sugars in your diet, such as [place] fries, hamburgers, cookies, candies, and soda.   A fiber supplement may be added to your diet if you cannot get enough fiber from foods.   Drink enough fluids to keep your urine clear or pale yellow.   Exercise regularly or as directed by your caregiver.   Go to the restroom when you have the urge to go. Do not hold it.  Only take medicines as directed by your caregiver. Do not take other medicines for constipation without talking to your caregiver first. [place] IF:   You have bright red blood in your stool.   Your constipation lasts for more than 4 days or gets worse.   You have abdominal or rectal pain.   You have thin, pencil-like stools.  You have unexplained weight loss. MAKE SURE YOU:   Understand these instructions.  Will watch your condition.  Will get help right away if you are not doing well or get worse. Document Released: 01/21/2004 Document Revised: 07/17/2011 Document Reviewed: 02/03/2013 [facility] Patient Information 2014 [place], [place].  Miralax 17grams daily, for 1-4 days, until bowel movement  Increase fluid intake (water) Increase fiber in diet (fruits, vegetables, whole grains) Take stool softner daily

## 2013-06-28 LAB — CMP14+EGFR
ALT: 23 IU/L (ref 0–44)
AST: 31 IU/L (ref 0–40)
Albumin/Globulin Ratio: 1.6 (ref 1.1–2.5)
Albumin: 4.1 g/dL (ref 3.5–4.8)
Alkaline Phosphatase: 55 IU/L (ref 39–117)
BILIRUBIN TOTAL: 0.7 mg/dL (ref 0.0–1.2)
BUN/Creatinine Ratio: 17 (ref 10–22)
BUN: 19 mg/dL (ref 8–27)
CO2: 30 mmol/L — ABNORMAL HIGH (ref 18–29)
CREATININE: 1.12 mg/dL (ref 0.76–1.27)
Calcium: 8.9 mg/dL (ref 8.6–10.2)
Chloride: 86 mmol/L — ABNORMAL LOW (ref 97–108)
GFR calc non Af Amer: 65 mL/min/{1.73_m2} (ref >59)
GFR, EST AFRICAN AMERICAN: 75 mL/min/{1.73_m2} (ref >59)
GLOBULIN, TOTAL: 2.6 g/dL (ref 1.5–4.5)
Glucose: 89 mg/dL (ref 65–99)
POTASSIUM: 3.4 mmol/L — AB (ref 3.5–5.2)
Sodium: 132 mmol/L — ABNORMAL LOW (ref 134–144)
Total Protein: 6.7 g/dL (ref 6.0–8.5)

## 2013-06-28 LAB — THYROID PANEL WITH TSH
FREE THYROXINE INDEX: 2.6 (ref 1.2–4.9)
T3 UPTAKE RATIO: 26 % (ref 24–39)
T4, Total: 10.1 ug/dL (ref 4.5–12.0)
TSH: 1.33 u[IU]/mL (ref 0.450–4.500)

## 2013-06-28 LAB — VITAMIN B12: Vitamin B-12: 1685 pg/mL — ABNORMAL HIGH (ref 211–946)

## 2013-07-01 ENCOUNTER — Other Ambulatory Visit: Payer: Self-pay | Admitting: General Practice

## 2013-07-01 DIAGNOSIS — E876 Hypokalemia: Secondary | ICD-10-CM

## 2013-07-08 ENCOUNTER — Other Ambulatory Visit (INDEPENDENT_AMBULATORY_CARE_PROVIDER_SITE_OTHER): Payer: Medicare Other

## 2013-07-08 DIAGNOSIS — E876 Hypokalemia: Secondary | ICD-10-CM

## 2013-07-09 LAB — CMP14+EGFR
ALT: 25 IU/L (ref 0–44)
AST: 21 IU/L (ref 0–40)
Albumin/Globulin Ratio: 2 (ref 1.1–2.5)
Albumin: 4.1 g/dL (ref 3.5–4.8)
Alkaline Phosphatase: 70 IU/L (ref 39–117)
BUN/Creatinine Ratio: 14 (ref 10–22)
BUN: 15 mg/dL (ref 8–27)
CALCIUM: 9.8 mg/dL (ref 8.6–10.2)
CHLORIDE: 99 mmol/L (ref 97–108)
CO2: 27 mmol/L (ref 18–29)
Creatinine, Ser: 1.04 mg/dL (ref 0.76–1.27)
GFR calc Af Amer: 83 mL/min/{1.73_m2} (ref >59)
GFR calc non Af Amer: 71 mL/min/{1.73_m2} (ref >59)
Globulin, Total: 2.1 g/dL (ref 1.5–4.5)
Glucose: 153 mg/dL — ABNORMAL HIGH (ref 65–99)
POTASSIUM: 4.1 mmol/L (ref 3.5–5.2)
Sodium: 140 mmol/L (ref 134–144)
Total Bilirubin: 0.5 mg/dL (ref 0.0–1.2)
Total Protein: 6.2 g/dL (ref 6.0–8.5)

## 2013-07-23 ENCOUNTER — Other Ambulatory Visit: Payer: Self-pay | Admitting: Family Medicine

## 2013-08-26 ENCOUNTER — Other Ambulatory Visit: Payer: Self-pay | Admitting: Family Medicine

## 2013-08-28 ENCOUNTER — Other Ambulatory Visit: Payer: Self-pay | Admitting: General Practice

## 2013-08-28 NOTE — Telephone Encounter (Signed)
Please phone in

## 2013-08-28 NOTE — Telephone Encounter (Signed)
Last seen 06/27/13 Mae  If approved route to nurse to call into [facility]

## 2013-08-29 ENCOUNTER — Telehealth: Payer: Self-pay | Admitting: *Deleted

## 2013-08-29 NOTE — Telephone Encounter (Signed)
Aware,rx ready.

## 2013-10-10 ENCOUNTER — Other Ambulatory Visit: Payer: Self-pay | Admitting: Nurse Practitioner

## 2013-12-03 ENCOUNTER — Other Ambulatory Visit: Payer: Self-pay | Admitting: *Deleted

## 2013-12-03 MED ORDER — INDAPAMIDE 2.5 MG PO TABS: ORAL_TABLET | ORAL | Status: DC

## 2013-12-03 MED ORDER — POTASSIUM CHLORIDE ER 10 MEQ PO TBCR: EXTENDED_RELEASE_TABLET | ORAL | Status: DC

## 2013-12-03 NOTE — Telephone Encounter (Signed)
Last ov 2/15. Last K+ level on 07/08/13 was 4.1.

## 2014-01-22 ENCOUNTER — Telehealth: Payer: Self-pay | Admitting: Family Medicine

## 2014-01-23 NOTE — Telephone Encounter (Signed)
Will refill at appt on 02/03/14, pt aware

## 2014-01-28 ENCOUNTER — Other Ambulatory Visit: Payer: Self-pay | Admitting: Family Medicine

## 2014-02-03 ENCOUNTER — Ambulatory Visit: Payer: Medicare Other

## 2014-02-03 ENCOUNTER — Ambulatory Visit: Payer: Medicare Other | Admitting: Family Medicine

## 2014-02-04 ENCOUNTER — Ambulatory Visit: Payer: Medicare Other | Admitting: Interventional Cardiology

## 2014-02-05 ENCOUNTER — Ambulatory Visit (INDEPENDENT_AMBULATORY_CARE_PROVIDER_SITE_OTHER): Payer: Medicare Other | Admitting: Family Medicine

## 2014-02-05 VITALS — BP 156/98 | HR 73 | Temp 97.8°F | Ht 71.0 in | Wt 205.0 lb

## 2014-02-05 DIAGNOSIS — F411 Generalized anxiety disorder: Secondary | ICD-10-CM

## 2014-02-05 DIAGNOSIS — Z23 Encounter for immunization: Secondary | ICD-10-CM

## 2014-02-05 MED ORDER — DIAZEPAM 5 MG PO TABS: 5.0000 mg | ORAL_TABLET | Freq: Four times a day (QID) | ORAL | Status: DC | PRN

## 2014-02-06 DIAGNOSIS — F411 Generalized anxiety disorder: Secondary | ICD-10-CM | POA: Insufficient documentation

## 2014-02-06 NOTE — Progress Notes (Signed)
   Subjective:    Patient ID: Hector [name], male    DOB: [birth year], 73 y.o.   MRN: [id number]  HPI  C/o needing refill on valium rx.  He has been on valium for years and needs refill.   Review of Systems    No chest pain, SOB, HA, dizziness, vision change, N/V, diarrhea, constipation, dysuria, urinary urgency or frequency, myalgias, arthralgias or rash.  Objective:   Physical Exam  Vital signs noted  Well developed well nourished male.  HEENT - Head atraumatic Normocephalic                Eyes - PERRLA, Conjuctiva - clear Sclera- Clear EOMI                Ears - EAC's Wnl TM's Wnl Gross Hearing WNL                Nose - Nares patent                 Throat - oropharanx wnl Respiratory - Lungs CTA bilateral Cardiac - RRR S1 and S2 without murmur GI - Abdomen soft Nontender and bowel sounds active x 4 Extremities - No edema. Neuro - Grossly intact.      Assessment & Plan:  Anxiety - Valium 5mg  one po bid prn #60w/3rf  Lysbeth Penner FNP

## 2014-03-01 ENCOUNTER — Other Ambulatory Visit: Payer: Self-pay | Admitting: Family Medicine

## 2014-04-29 ENCOUNTER — Other Ambulatory Visit: Payer: Self-pay | Admitting: Family Medicine

## 2014-06-02 ENCOUNTER — Other Ambulatory Visit: Payer: Self-pay | Admitting: Family Medicine

## 2014-07-25 ENCOUNTER — Other Ambulatory Visit: Payer: Self-pay | Admitting: Family Medicine

## 2014-08-18 ENCOUNTER — Encounter: Payer: Self-pay | Admitting: Family Medicine

## 2014-08-18 ENCOUNTER — Ambulatory Visit (INDEPENDENT_AMBULATORY_CARE_PROVIDER_SITE_OTHER): Payer: Medicare Other | Admitting: Family Medicine

## 2014-08-18 VITALS — BP 143/83 | HR 60 | Temp 97.5°F | Ht 71.0 in | Wt 203.0 lb

## 2014-08-18 DIAGNOSIS — I10 Essential (primary) hypertension: Secondary | ICD-10-CM | POA: Diagnosis not present

## 2014-08-18 DIAGNOSIS — D751 Secondary polycythemia: Secondary | ICD-10-CM

## 2014-08-18 LAB — POCT CBC
GRANULOCYTE PERCENT: 63.2 % (ref 37–80)
HCT, POC: 49.5 % (ref 43.5–53.7)
Hemoglobin: 16.8 g/dL (ref 14.1–18.1)
Lymph, poc: 2.4 (ref 0.6–3.4)
MCH: 30.5 pg (ref 27–31.2)
MCHC: 33.9 g/dL (ref 31.8–35.4)
MCV: 89.9 fL (ref 80–97)
MPV: 8.2 fL (ref 0–99.8)
POC Granulocyte: 5.2 (ref 2–6.9)
POC LYMPH PERCENT: 28.8 %L (ref 10–50)
Platelet Count, POC: 222 10*3/uL (ref 142–424)
RBC: 5.51 M/uL (ref 4.69–6.13)
RDW, POC: 14.8 %
WBC: 8.2 10*3/uL (ref 4.6–10.2)

## 2014-08-18 MED ORDER — DIAZEPAM 5 MG PO TABS: 5.0000 mg | ORAL_TABLET | Freq: Four times a day (QID) | ORAL | Status: DC | PRN

## 2014-08-18 MED ORDER — PANTOPRAZOLE SODIUM 40 MG PO TBEC: 40.0000 mg | DELAYED_RELEASE_TABLET | Freq: Every day | ORAL | Status: DC

## 2014-08-18 NOTE — Progress Notes (Signed)
Subjective:  Patient ID: Hector [name], male    DOB: 1940/12/08  Age: 74 y.o. MRN: [id number]  CC: Hypertension; Peripheral Neuropathy; and polycythemia   HPI SARP VERNIER presents for tingling in fingers and toes. Uses valium for sleep around twice a week.   follow-up of hypertension. Patient has no history of headache chest pain or shortness of breath or recent cough. Patient also denies symptoms of TIA such as numbness weakness lateralizing. Patient checks  blood pressure at home and has not had any elevated readings recently. Patient denies side effects from his medication. States taking it regularly. The polycythemia has led to hemoglobin in the 19 range. He donates blood as a method of phlebotomy based on recommendation by his hematologist. 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   He has past surgical history that includes Appendectomy (02/25/11); Transurethral resection of prostate (2004); and Colonoscopy.   His family history includes Cancer in his paternal uncle; Heart disease (age of onset: 43) in his father; Stroke (age of onset: 28) in his mother. There is no history of Colon cancer.He reports that he quit smoking about 26 years ago. He has never used smokeless tobacco. He reports that he drinks about 3.5 oz of alcohol per week. He reports that he does not use illicit drugs.  Current Outpatient Prescriptions on File Prior to Visit  Medication Sig Dispense Refill  . aspirin 81 MG tablet Take 81 mg by mouth daily.      Marland Kitchen atenolol (TENORMIN) 50 MG tablet TAKE 1 TABLET (50 MG TOTAL) BY MOUTH DAILY. 90 tablet 0  . KLOR-CON 10 10 MEQ tablet TAKE 1 TABLET EVERY DAY 90 tablet 1  . vitamin B-12 (CYANOCOBALAMIN) 1000 MCG tablet Take 1,000 mcg by mouth  daily.       No current facility-administered medications on file prior to visit.    ROS Review of Systems  Constitutional: Negative for fever, chills, diaphoresis and unexpected weight change.  HENT: Negative for congestion, hearing loss, rhinorrhea, sore throat and trouble swallowing.   Respiratory: Negative for cough, chest tightness, shortness of breath and wheezing.   Gastrointestinal: Negative for nausea, vomiting, abdominal pain, diarrhea, constipation and abdominal distention.  Endocrine: Negative for cold intolerance and heat intolerance.  Genitourinary: Negative for dysuria, hematuria and flank pain.  Musculoskeletal: Negative for joint swelling and arthralgias.  Skin: Negative for rash.  Neurological: Negative for dizziness and headaches.  Psychiatric/Behavioral: Negative for dysphoric mood, decreased concentration and agitation. The patient is not nervous/anxious.     Objective:  BP 143/83 mmHg  Pulse 60  Temp(Src) 97.5 F (36.4 C) (Oral)  Ht 5\' 11"  (1.803 m)  Wt 203 lb (92.08 kg)  BMI 28.33 kg/m2  BP Readings from Last 3 Encounters:  08/18/14 143/83  02/05/14 156/98  06/27/13 130/80    Wt Readings from Last 3 Encounters:  08/18/14 203 lb (92.08 kg)  02/05/14 205 lb (92.987 kg)  06/27/13 192 lb 8 oz (87.317 kg)     Physical Exam  Constitutional: He is oriented to person, place, and time. He appears well-developed and well-nourished. No distress.  HENT:  Head: Normocephalic and atraumatic.  Right Ear: External ear normal.  Left Ear: External ear normal.  Nose: Nose normal.  Mouth/Throat: Oropharynx is clear and moist.  Eyes: Conjunctivae and EOM are  normal. Pupils are equal, round, and reactive to light.  Neck: Normal range of motion. Neck supple. No thyromegaly present.  Cardiovascular: Normal rate, regular rhythm and normal heart sounds.   No murmur heard. Pulmonary/Chest: Effort normal and breath sounds normal. No respiratory distress. He has no  wheezes. He has no rales.  Abdominal: Soft. Bowel sounds are normal. He exhibits no distension. There is no tenderness.  Lymphadenopathy:    He has no cervical adenopathy.  Neurological: He is alert and oriented to person, place, and time. He has normal reflexes.  Skin: Skin is warm and dry.  Psychiatric: He has a normal mood and affect. His behavior is normal. Judgment and thought content normal.    No results found for: HGBA1C  Lab Results  Component Value Date   WBC 8.2 08/18/2014   HGB 16.8 08/18/2014   HCT 49.5 08/18/2014   PLT 217 11/23/2011   GLUCOSE 153* 07/08/2013   ALT 25 07/08/2013   AST 21 07/08/2013   NA 140 07/08/2013   K 4.1 07/08/2013   CL 99 07/08/2013   CREATININE 1.04 07/08/2013   BUN 15 07/08/2013   CO2 27 07/08/2013   TSH 1.330 06/27/2013   PSA 1.3 02/13/2013    Ct Abdomen Pelvis W Contrast  02/06/2011   *RADIOLOGY REPORT*  Clinical Data: Abdominal pain, elevated white blood cell count  CT ABDOMEN AND PELVIS WITH CONTRAST  Technique:  Multidetector CT imaging of the abdomen and pelvis was performed following the standard protocol during bolus administration of intravenous contrast.  Contrast: 138mL OMNIPAQUE IOHEXOL 300 MG/ML IV SOLN  Comparison: None.  Findings:  Normal hepatic contour.  Punctate calcification within the right lobe of the liver, likely the sequela of prior granulomatous infection.  Additional sub centimeter hypoattenuating lesion within the liver (image 17) is too small to accurately characterize though favored to represent a hepatic cyst.  Normal gallbladder. Hepatic vasculature is patent.  No ascites.  There is symmetric enhancement and excretion of the bilateral kidneys.  Incidental note is made of multiple left-sided parapelvic cysts.  No urinary obstruction.  No perinephric stranding. The prostate is enlarged with internal calcifications.  Incidental note is made of a tiny right-sided bladder diverticuli (image 72, series 2).  No free fluid  within the pelvis.  The bilateral adrenal glands are normal.  Normal pancreas and spleen.  The appendix is enlarged and is associated with adjacent mesenteric stranding and shoddy right lower quadrant mesenteric lymph nodes. No perforation or drainable fluid collection.  Colonic diverticulosis without evidence of diverticulitis. Ingested contrast extends to the distal small bowel.  No evidence of enteric obstruction. No pneumoperitoneum, pneumatosis or portal venous gas.  Scattered atherosclerotic calcifications of a normal caliber abdominal aorta.  The major branch vessels of the abdominal aorta, including the IMA, are patent.  No retroperitoneal, mesenteric, pelvic or inguinal lymphadenopathy.  Limited visualization of the lower thorax demonstrates linear heterogeneous opacities within the right lower lobe and inferior segment of the lingula favored to represent atelectasis.  No focal airspace opacities.  No pleural effusion.  Normal heart size.  No pericardial effusion.  No acute aggressive osseous abnormalities.  Degenerative change within the lower thoracic spine. L eft-sided mesenteric fat containing direct inguinal hernia.  IMPRESSION: 1.  Findings compatible with uncomplicated acute appendicitis.  No perforation or drainable fluid collection. 2.  Colonic diverticulosis without evidence of diverticulitis.  No enteric obstruction. 3.  Multiple left-sided parapelvic cysts.  No urinary obstruction.  Original Report Authenticated By: Jenny Reichmann  A. Ivy Lynn, M.D.   Assessment & Plan:   Rider was seen today for hypertension, peripheral neuropathy and polycythemia.  Diagnoses and all orders for this visit:  Essential hypertension Orders: -     CMP14+EGFR; Standing -     Lipid panel; Standing -     NMR, lipoprofile; Standing -     CMP14+EGFR -     Lipid panel  Polycythemia Orders: -     POCT CBC; Standing -     Thyroid Panel With TSH -     Vit D  25 hydroxy (rtn osteoporosis monitoring); Standing -      POCT CBC -     Vit D  25 hydroxy (rtn osteoporosis monitoring)  Other orders -     Discontinue: pantoprazole (PROTONIX) 40 MG tablet; Take 1 tablet (40 mg total) by mouth daily. For stomach -     diazepam (VALIUM) 5 MG tablet; Take 1 tablet (5 mg total) by mouth every 6 (six) hours as needed for anxiety. -     pantoprazole (PROTONIX) 40 MG tablet; Take 1 tablet (40 mg total) by mouth daily. For stomach   I have discontinued Mr. Cedrone indapamide and lansoprazole. I am also having him maintain his vitamin B-12, aspirin, KLOR-CON 10, atenolol, diazepam, and pantoprazole.  Meds ordered this encounter  Medications  . DISCONTD: pantoprazole (PROTONIX) 40 MG tablet    Sig: Take 1 tablet (40 mg total) by mouth daily. For stomach    Dispense:  30 tablet    Refill:  3  . diazepam (VALIUM) 5 MG tablet    Sig: Take 1 tablet (5 mg total) by mouth every 6 (six) hours as needed for anxiety.    Dispense:  60 tablet    Refill:  3  . pantoprazole (PROTONIX) 40 MG tablet    Sig: Take 1 tablet (40 mg total) by mouth daily. For stomach    Dispense:  90 tablet    Refill:  3     Follow-up: Return in about 6 months (around 02/17/2015).  Claretta Fraise, M.D.

## 2014-08-19 LAB — CMP14+EGFR
A/G RATIO: 1.8 (ref 1.1–2.5)
ALBUMIN: 4.1 g/dL (ref 3.5–4.8)
ALT: 21 IU/L (ref 0–44)
AST: 20 IU/L (ref 0–40)
Alkaline Phosphatase: 72 IU/L (ref 39–117)
BILIRUBIN TOTAL: 0.6 mg/dL (ref 0.0–1.2)
BUN/Creatinine Ratio: 11 (ref 10–22)
BUN: 13 mg/dL (ref 8–27)
CHLORIDE: 102 mmol/L (ref 97–108)
CO2: 22 mmol/L (ref 18–29)
Calcium: 9.5 mg/dL (ref 8.6–10.2)
Creatinine, Ser: 1.2 mg/dL (ref 0.76–1.27)
GFR, EST AFRICAN AMERICAN: 69 mL/min/{1.73_m2} (ref >59)
GFR, EST NON AFRICAN AMERICAN: 60 mL/min/{1.73_m2} (ref >59)
Globulin, Total: 2.3 g/dL (ref 1.5–4.5)
Glucose: 85 mg/dL (ref 65–99)
Potassium: 4.2 mmol/L (ref 3.5–5.2)
Sodium: 138 mmol/L (ref 134–144)
TOTAL PROTEIN: 6.4 g/dL (ref 6.0–8.5)

## 2014-08-19 LAB — LIPID PANEL
CHOL/HDL RATIO: 3.5 ratio (ref 0.0–5.0)
Cholesterol, Total: 138 mg/dL (ref 100–199)
HDL: 40 mg/dL (ref >39)
LDL Calculated: 68 mg/dL (ref 0–99)
Triglycerides: 151 mg/dL — ABNORMAL HIGH (ref 0–149)
VLDL CHOLESTEROL CAL: 30 mg/dL (ref 5–40)

## 2014-08-19 LAB — THYROID PANEL WITH TSH
FREE THYROXINE INDEX: 1.8 (ref 1.2–4.9)
T3 UPTAKE RATIO: 27 % (ref 24–39)
T4 TOTAL: 6.6 ug/dL (ref 4.5–12.0)
TSH: 2.21 u[IU]/mL (ref 0.450–4.500)

## 2014-08-19 LAB — VITAMIN D 25 HYDROXY (VIT D DEFICIENCY, FRACTURES): Vit D, 25-Hydroxy: 31.3 ng/mL (ref 30.0–100.0)

## 2014-08-24 NOTE — Progress Notes (Signed)
Lmtcb/ww 4/18

## 2014-08-29 ENCOUNTER — Other Ambulatory Visit: Payer: Self-pay | Admitting: Family Medicine

## 2014-10-21 ENCOUNTER — Other Ambulatory Visit: Payer: Self-pay | Admitting: Family Medicine

## 2014-11-26 ENCOUNTER — Encounter: Payer: Self-pay | Admitting: *Deleted

## 2015-01-12 ENCOUNTER — Telehealth: Payer: Self-pay | Admitting: Family Medicine

## 2015-01-12 DIAGNOSIS — Z0000 Encounter for general adult medical examination without abnormal findings: Secondary | ICD-10-CM

## 2015-01-12 DIAGNOSIS — Z125 Encounter for screening for malignant neoplasm of prostate: Secondary | ICD-10-CM

## 2015-01-12 DIAGNOSIS — E785 Hyperlipidemia, unspecified: Secondary | ICD-10-CM

## 2015-01-12 DIAGNOSIS — E559 Vitamin D deficiency, unspecified: Secondary | ICD-10-CM

## 2015-01-12 DIAGNOSIS — D751 Secondary polycythemia: Secondary | ICD-10-CM

## 2015-01-12 NOTE — Telephone Encounter (Signed)
Tests ordered 

## 2015-01-12 NOTE — Telephone Encounter (Signed)
Patient has appointment for physical on 01/26/15, wants to come in for labwork before hand.  Will you please order future labs for him and let us know when this is complete so we can let him know it is okay to come in early for labwork.

## 2015-01-13 NOTE — Telephone Encounter (Signed)
Patient aware.

## 2015-01-21 ENCOUNTER — Other Ambulatory Visit: Payer: Self-pay | Admitting: Family Medicine

## 2015-01-21 ENCOUNTER — Other Ambulatory Visit (INDEPENDENT_AMBULATORY_CARE_PROVIDER_SITE_OTHER): Payer: Medicare Other

## 2015-01-21 DIAGNOSIS — D751 Secondary polycythemia: Secondary | ICD-10-CM | POA: Diagnosis not present

## 2015-01-21 DIAGNOSIS — I10 Essential (primary) hypertension: Secondary | ICD-10-CM

## 2015-01-21 NOTE — Progress Notes (Signed)
Lab only 

## 2015-01-22 ENCOUNTER — Other Ambulatory Visit: Payer: Self-pay | Admitting: Family Medicine

## 2015-01-22 LAB — CBC WITH DIFFERENTIAL/PLATELET
Basophils Absolute: 0 10*3/uL (ref 0.0–0.2)
Basos: 1 %
EOS (ABSOLUTE): 0.3 10*3/uL (ref 0.0–0.4)
Eos: 4 %
Hematocrit: 52.6 % — ABNORMAL HIGH (ref 37.5–51.0)
Hemoglobin: 17.8 g/dL — ABNORMAL HIGH (ref 12.6–17.7)
Immature Grans (Abs): 0 10*3/uL (ref 0.0–0.1)
Immature Granulocytes: 0 %
Lymphocytes Absolute: 1.6 10*3/uL (ref 0.7–3.1)
Lymphs: 23 %
MCH: 31.2 pg (ref 26.6–33.0)
MCHC: 33.8 g/dL (ref 31.5–35.7)
MCV: 92 fL (ref 79–97)
Monocytes Absolute: 0.6 10*3/uL (ref 0.1–0.9)
Monocytes: 8 %
Neutrophils Absolute: 4.5 10*3/uL (ref 1.4–7.0)
Neutrophils: 64 %
Platelets: 176 10*3/uL (ref 150–379)
RBC: 5.7 x10E6/uL (ref 4.14–5.80)
RDW: 14.1 % (ref 12.3–15.4)
WBC: 7 10*3/uL (ref 3.4–10.8)

## 2015-01-22 LAB — CMP14+EGFR
A/G RATIO: 2 (ref 1.1–2.5)
ALBUMIN: 4 g/dL (ref 3.5–4.8)
ALT: 16 IU/L (ref 0–44)
AST: 20 IU/L (ref 0–40)
Alkaline Phosphatase: 67 IU/L (ref 39–117)
BUN/Creatinine Ratio: 13 (ref 10–22)
BUN: 15 mg/dL (ref 8–27)
Bilirubin Total: 0.6 mg/dL (ref 0.0–1.2)
CALCIUM: 9.4 mg/dL (ref 8.6–10.2)
CO2: 25 mmol/L (ref 18–29)
CREATININE: 1.12 mg/dL (ref 0.76–1.27)
Chloride: 101 mmol/L (ref 97–108)
GFR, EST AFRICAN AMERICAN: 74 mL/min/{1.73_m2} (ref >59)
GFR, EST NON AFRICAN AMERICAN: 64 mL/min/{1.73_m2} (ref >59)
GLOBULIN, TOTAL: 2 g/dL (ref 1.5–4.5)
Glucose: 112 mg/dL — ABNORMAL HIGH (ref 65–99)
Potassium: 4.5 mmol/L (ref 3.5–5.2)
SODIUM: 141 mmol/L (ref 134–144)
TOTAL PROTEIN: 6 g/dL (ref 6.0–8.5)

## 2015-01-22 LAB — LIPID PANEL
CHOL/HDL RATIO: 3.3 ratio (ref 0.0–5.0)
Cholesterol, Total: 130 mg/dL (ref 100–199)
HDL: 40 mg/dL (ref >39)
LDL Calculated: 64 mg/dL (ref 0–99)
TRIGLYCERIDES: 129 mg/dL (ref 0–149)
VLDL Cholesterol Cal: 26 mg/dL (ref 5–40)

## 2015-01-22 LAB — VITAMIN D 25 HYDROXY (VIT D DEFICIENCY, FRACTURES): VIT D 25 HYDROXY: 33.9 ng/mL (ref 30.0–100.0)

## 2015-01-26 ENCOUNTER — Encounter: Payer: Self-pay | Admitting: Family Medicine

## 2015-01-26 ENCOUNTER — Ambulatory Visit (INDEPENDENT_AMBULATORY_CARE_PROVIDER_SITE_OTHER): Payer: Medicare Other | Admitting: Family Medicine

## 2015-01-26 VITALS — BP 131/79 | HR 67 | Temp 97.4°F | Ht 70.0 in | Wt 196.8 lb

## 2015-01-26 DIAGNOSIS — G4700 Insomnia, unspecified: Secondary | ICD-10-CM

## 2015-01-26 DIAGNOSIS — H60392 Other infective otitis externa, left ear: Secondary | ICD-10-CM | POA: Diagnosis not present

## 2015-01-26 DIAGNOSIS — Z23 Encounter for immunization: Secondary | ICD-10-CM | POA: Diagnosis not present

## 2015-01-26 DIAGNOSIS — Z1212 Encounter for screening for malignant neoplasm of rectum: Secondary | ICD-10-CM

## 2015-01-26 DIAGNOSIS — R202 Paresthesia of skin: Secondary | ICD-10-CM | POA: Diagnosis not present

## 2015-01-26 DIAGNOSIS — Z0000 Encounter for general adult medical examination without abnormal findings: Secondary | ICD-10-CM

## 2015-01-26 DIAGNOSIS — I10 Essential (primary) hypertension: Secondary | ICD-10-CM

## 2015-01-26 MED ORDER — CIPROFLOXACIN-HYDROCORTISONE 0.2-1 % OT SUSP: 3.0000 [drp] | Freq: Two times a day (BID) | OTIC | Status: DC

## 2015-01-26 MED ORDER — PANTOPRAZOLE SODIUM 40 MG PO TBEC: 40.0000 mg | DELAYED_RELEASE_TABLET | Freq: Every day | ORAL | Status: DC

## 2015-01-26 MED ORDER — POTASSIUM CHLORIDE ER 10 MEQ PO TBCR: 10.0000 meq | EXTENDED_RELEASE_TABLET | Freq: Every day | ORAL | Status: DC

## 2015-01-26 MED ORDER — TRAZODONE HCL 150 MG PO TABS: 150.0000 mg | ORAL_TABLET | Freq: Every day | ORAL | Status: DC

## 2015-01-26 MED ORDER — ATENOLOL 50 MG PO TABS: 50.0000 mg | ORAL_TABLET | Freq: Every day | ORAL | Status: DC

## 2015-01-26 NOTE — Progress Notes (Signed)
Subjective:  Patient ID: Hector [name], male    DOB: 07/09/1940  Age: 74 y.o. MRN: [id number]  CC: Annual Exam   HPI Hector [name] presents for neuropathy still present in feet for 6 yrs. Hands for 1 yr. states they tingle. Denies weakness. No known triggers such as heat or cold. It is intermittent it is mild to moderate.  Left ear has recurrent drainage that plugs the canal about once a year.  Using diazepam twice weekly or less for sleep. Denies anxiety.    follow-up of hypertension. Patient has no history of headache chest pain or shortness of breath or recent cough. Patient also denies symptoms of TIA such as numbness weakness lateralizing. Patient checks  blood pressure at home and has not had any elevated readings recently. Patient denies side effects from his medication. States taking it regularly.  Patient in for follow-up of GERD. Currently asymptomatic taking  PPI daily. There is no chest pain or heartburn. No hematemesis and no melena. No dysphagia or choking. Onset is remote. Progression is stable. Complicating factors, none.   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   He has past surgical history that includes Appendectomy (02/25/11); Transurethral resection of prostate (2004); and Colonoscopy.   His family history includes Cancer in his paternal uncle; Heart disease (age of onset: 3) in his father; Stroke (age of onset: 72) in his mother. There is no history of Colon cancer.He reports that he quit smoking about 26 years ago. He has never used smokeless tobacco. He reports that he drinks about 3.5 oz of alcohol per week. He reports that he does not use illicit drugs.  Outpatient Prescriptions Prior to Visit  Medication Sig Dispense Refill  . aspirin 81 MG tablet Take 81 mg by mouth  daily.      . vitamin B-12 (CYANOCOBALAMIN) 1000 MCG tablet Take 1,000 mcg by mouth daily.      Marland Kitchen atenolol (TENORMIN) 50 MG tablet TAKE 1 TABLET (50 MG TOTAL) BY MOUTH DAILY. 90 tablet 0  . atenolol (TENORMIN) 50 MG tablet TAKE 1 TABLET (50 MG TOTAL) BY MOUTH DAILY. 90 tablet 0  . diazepam (VALIUM) 5 MG tablet Take 1 tablet (5 mg total) by mouth every 6 (six) hours as needed for anxiety. 60 tablet 3  . KLOR-CON 10 10 MEQ tablet TAKE 1 TABLET EVERY DAY 90 tablet 1  . pantoprazole (PROTONIX) 40 MG tablet Take 1 tablet (40 mg total) by mouth daily. For stomach 90 tablet 3   No facility-administered medications prior to visit.    ROS Review of Systems  Constitutional: Negative for fever, chills, diaphoresis, activity change, appetite change, fatigue and unexpected weight change.  HENT: Positive for ear discharge (chronic, recurrent. Responded to ear drops in the past. No pain or hearing loss. ). Negative for congestion, ear pain, hearing loss, postnasal drip, rhinorrhea, sore throat, tinnitus and trouble swallowing.   Eyes: Negative for photophobia, pain, discharge and redness.  Respiratory: Negative for apnea, cough, choking, chest tightness, shortness of breath, wheezing and stridor.   Cardiovascular: Negative for chest pain, palpitations and leg swelling.  Gastrointestinal: Negative for nausea, vomiting, abdominal pain, diarrhea, constipation, blood in stool and abdominal distention.  Endocrine: Negative for cold intolerance, heat intolerance, polydipsia, polyphagia and polyuria.  Genitourinary: Negative for dysuria, urgency, frequency, hematuria, flank pain, enuresis, difficulty urinating and genital sores.  Musculoskeletal:  Negative for joint swelling and arthralgias.  Skin: Negative for color change, rash and wound.  Allergic/Immunologic: Negative for immunocompromised state.  Neurological: Negative for dizziness, tremors, seizures, syncope, facial asymmetry, speech difficulty, weakness,  light-headedness, numbness and headaches.  Hematological: Does not bruise/bleed easily.  Psychiatric/Behavioral: Negative for suicidal ideas, hallucinations, behavioral problems, confusion, sleep disturbance, dysphoric mood, decreased concentration and agitation. The patient is not nervous/anxious and is not hyperactive.     Objective:  BP 131/79 mmHg  Pulse 67  Temp(Src) 97.4 F (36.3 C) (Oral)  Ht $R'5\' 10"'pS$  (1.778 m)  Wt 196 lb 12.8 oz (89.268 kg)  BMI 28.24 kg/m2  BP Readings from Last 3 Encounters:  01/26/15 131/79  08/18/14 143/83  02/05/14 156/98    Wt Readings from Last 3 Encounters:  01/26/15 196 lb 12.8 oz (89.268 kg)  08/18/14 203 lb (92.08 kg)  02/05/14 205 lb (92.987 kg)     Physical Exam  Constitutional: He is oriented to person, place, and time. He appears well-developed and well-nourished.  HENT:  Head: Normocephalic and atraumatic.  Mouth/Throat: Oropharynx is clear and moist.  Eyes: EOM are normal. Pupils are equal, round, and reactive to light.  Neck: Normal range of motion. No tracheal deviation present. No thyromegaly present.  Cardiovascular: Normal rate, regular rhythm and normal heart sounds.  Exam reveals no gallop and no friction rub.   No murmur heard. Pulmonary/Chest: Breath sounds normal. He has no wheezes. He has no rales.  Abdominal: Soft. He exhibits no mass. There is no tenderness.  Genitourinary: Rectum normal, prostate normal and penis normal. No penile tenderness.  INternal hemorrhoids, prostate nml. IFOB obtained  Musculoskeletal: Normal range of motion. He exhibits no edema.  Neurological: He is alert and oriented to person, place, and time. He has normal reflexes. No cranial nerve deficit. He exhibits normal muscle tone.  Skin: Skin is warm and dry.  Psychiatric: He has a normal mood and affect.    No results found for: HGBA1C  Lab Results  Component Value Date   WBC 7.0 01/21/2015   HGB 16.8 08/18/2014   HCT 52.6* 01/21/2015    PLT 217 11/23/2011   GLUCOSE 112* 01/21/2015   CHOL 130 01/21/2015   TRIG 129 01/21/2015   HDL 40 01/21/2015   LDLCALC 64 01/21/2015   ALT 16 01/21/2015   AST 20 01/21/2015   NA 141 01/21/2015   K 4.5 01/21/2015   CL 101 01/21/2015   CREATININE 1.12 01/21/2015   BUN 15 01/21/2015   CO2 25 01/21/2015   TSH 2.210 08/18/2014   PSA 1.3 02/13/2013    Ct Abdomen Pelvis W Contrast  02/06/2011   *RADIOLOGY REPORT*  Clinical Data: Abdominal pain, elevated white blood cell count  CT ABDOMEN AND PELVIS WITH CONTRAST  Technique:  Multidetector CT imaging of the abdomen and pelvis was performed following the standard protocol during bolus administration of intravenous contrast.  Contrast: 194mL OMNIPAQUE IOHEXOL 300 MG/ML IV SOLN  Comparison: None.  Findings:  Normal hepatic contour.  Punctate calcification within the right lobe of the liver, likely the sequela of prior granulomatous infection.  Additional sub centimeter hypoattenuating lesion within the liver (image 17) is too small to accurately characterize though favored to represent a hepatic cyst.  Normal gallbladder. Hepatic vasculature is patent.  No ascites.  There is symmetric enhancement and excretion of the bilateral kidneys.  Incidental note is made of multiple left-sided parapelvic cysts.  No urinary obstruction.  No perinephric stranding. The prostate is enlarged with  internal calcifications.  Incidental note is made of a tiny right-sided bladder diverticuli (image 72, series 2).  No free fluid within the pelvis.  The bilateral adrenal glands are normal.  Normal pancreas and spleen.  The appendix is enlarged and is associated with adjacent mesenteric stranding and shoddy right lower quadrant mesenteric lymph nodes. No perforation or drainable fluid collection.  Colonic diverticulosis without evidence of diverticulitis. Ingested contrast extends to the distal small bowel.  No evidence of enteric obstruction. No pneumoperitoneum, pneumatosis or  portal venous gas.  Scattered atherosclerotic calcifications of a normal caliber abdominal aorta.  The major branch vessels of the abdominal aorta, including the IMA, are patent.  No retroperitoneal, mesenteric, pelvic or inguinal lymphadenopathy.  Limited visualization of the lower thorax demonstrates linear heterogeneous opacities within the right lower lobe and inferior segment of the lingula favored to represent atelectasis.  No focal airspace opacities.  No pleural effusion.  Normal heart size.  No pericardial effusion.  No acute aggressive osseous abnormalities.  Degenerative change within the lower thoracic spine. L eft-sided mesenteric fat containing direct inguinal hernia.  IMPRESSION: 1.  Findings compatible with uncomplicated acute appendicitis.  No perforation or drainable fluid collection. 2.  Colonic diverticulosis without evidence of diverticulitis.  No enteric obstruction. 3.  Multiple left-sided parapelvic cysts.  No urinary obstruction.  Original Report Authenticated By: Rachel Moulds, M.D.   Assessment & Plan:   Carlus was seen today for annual exam.  Diagnoses and all orders for this visit:  Wellness examination -     Pneumococcal conjugate vaccine 13-valent -     CBC with Differential/Platelet -     CMP14+EGFR -     Lipid panel -     PSA, total and free -     POCT urinalysis dipstick -     Vitamin B12 -     Vit D  25 hydroxy (rtn osteoporosis monitoring)  Paresthesia of both lower extremities -     CBC with Differential/Platelet -     CMP14+EGFR -     Lipid panel -     POCT urinalysis dipstick -     Vitamin B12 -     Vit D  25 hydroxy (rtn osteoporosis monitoring)  Paresthesia of both hands -     CBC with Differential/Platelet -     CMP14+EGFR -     Lipid panel -     POCT urinalysis dipstick -     Vitamin B12 -     Vit D  25 hydroxy (rtn osteoporosis monitoring)  Otitis, externa, infective, left -     ciprofloxacin-hydrocortisone (CIPRO HC) otic suspension;  Place 3 drops into the left ear 2 (two) times daily. -     CBC with Differential/Platelet  Essential hypertension -     atenolol (TENORMIN) 50 MG tablet; Take 1 tablet (50 mg total) by mouth daily.  Insomnia -     traZODone (DESYREL) 150 MG tablet; Take 1 tablet (150 mg total) by mouth at bedtime. Can vary dose from 1/2 to 2 at bedtime as needed for sleep.  Other orders -     pantoprazole (PROTONIX) 40 MG tablet; Take 1 tablet (40 mg total) by mouth daily. For stomach -     potassium chloride (KLOR-CON 10) 10 MEQ tablet; Take 1 tablet (10 mEq total) by mouth daily.   I have discontinued Mr. Madeira diazepam. I have also changed his KLOR-CON 10 to potassium chloride. Additionally, I am having him start on  traZODone and ciprofloxacin-hydrocortisone. Lastly, I am having him maintain his vitamin B-12, aspirin, atenolol, and pantoprazole.  Meds ordered this encounter  Medications  . atenolol (TENORMIN) 50 MG tablet    Sig: Take 1 tablet (50 mg total) by mouth daily.    Dispense:  90 tablet    Refill:  1  . traZODone (DESYREL) 150 MG tablet    Sig: Take 1 tablet (150 mg total) by mouth at bedtime. Can vary dose from 1/2 to 2 at bedtime as needed for sleep.    Dispense:  180 tablet    Refill:  3  . pantoprazole (PROTONIX) 40 MG tablet    Sig: Take 1 tablet (40 mg total) by mouth daily. For stomach    Dispense:  90 tablet    Refill:  3  . potassium chloride (KLOR-CON 10) 10 MEQ tablet    Sig: Take 1 tablet (10 mEq total) by mouth daily.    Dispense:  90 tablet    Refill:  3  . ciprofloxacin-hydrocortisone (CIPRO HC) otic suspension    Sig: Place 3 drops into the left ear 2 (two) times daily.    Dispense:  10 mL    Refill:  10     Follow-up: Return in about 6 months (around 07/26/2015) for hypertension.  Claretta Fraise, M.D.

## 2015-01-26 NOTE — Addendum Note (Signed)
Addended by: Selmer Dominion on: 01/26/2015 04:01 PM   Modules accepted: Orders

## 2015-01-29 LAB — FECAL OCCULT BLOOD, IMMUNOCHEMICAL: Fecal Occult Bld: NEGATIVE

## 2015-02-12 ENCOUNTER — Ambulatory Visit (INDEPENDENT_AMBULATORY_CARE_PROVIDER_SITE_OTHER): Payer: Medicare Other

## 2015-02-12 DIAGNOSIS — Z23 Encounter for immunization: Secondary | ICD-10-CM

## 2015-02-17 ENCOUNTER — Ambulatory Visit: Payer: Medicare Other | Admitting: Family Medicine

## 2015-02-18 ENCOUNTER — Ambulatory Visit: Payer: Medicare Other | Admitting: Family Medicine

## 2015-03-27 ENCOUNTER — Other Ambulatory Visit: Payer: Self-pay | Admitting: Family Medicine

## 2015-04-09 ENCOUNTER — Encounter: Payer: Self-pay | Admitting: Internal Medicine

## 2015-07-27 ENCOUNTER — Encounter: Payer: Self-pay | Admitting: Family Medicine

## 2015-07-27 ENCOUNTER — Ambulatory Visit (INDEPENDENT_AMBULATORY_CARE_PROVIDER_SITE_OTHER): Payer: Medicare Other | Admitting: Family Medicine

## 2015-07-27 VITALS — BP 138/83 | HR 63 | Temp 97.8°F | Ht 70.0 in | Wt 200.4 lb

## 2015-07-27 DIAGNOSIS — E538 Deficiency of other specified B group vitamins: Secondary | ICD-10-CM

## 2015-07-27 DIAGNOSIS — R202 Paresthesia of skin: Secondary | ICD-10-CM

## 2015-07-27 DIAGNOSIS — K219 Gastro-esophageal reflux disease without esophagitis: Secondary | ICD-10-CM | POA: Insufficient documentation

## 2015-07-27 DIAGNOSIS — E559 Vitamin D deficiency, unspecified: Secondary | ICD-10-CM | POA: Diagnosis not present

## 2015-07-27 DIAGNOSIS — G4700 Insomnia, unspecified: Secondary | ICD-10-CM | POA: Insufficient documentation

## 2015-07-27 DIAGNOSIS — E785 Hyperlipidemia, unspecified: Secondary | ICD-10-CM

## 2015-07-27 DIAGNOSIS — R739 Hyperglycemia, unspecified: Secondary | ICD-10-CM | POA: Diagnosis not present

## 2015-07-27 DIAGNOSIS — I10 Essential (primary) hypertension: Secondary | ICD-10-CM | POA: Diagnosis not present

## 2015-07-27 LAB — BAYER DCA HB A1C WAIVED: HB A1C (BAYER DCA - WAIVED): 5.6 % (ref <7.0)

## 2015-07-27 NOTE — Progress Notes (Signed)
Subjective:  Patient ID: Hector [name], male    DOB: 09/29/1940  Age: 75 y.o. MRN: [id number]  CC: Hypertension   HPI Hector [name] presents for  follow-up of hypertension. Patient has no history of headache chest pain or shortness of breath or recent cough. Patient also denies symptoms of TIA such as numbness weakness lateralizing. Patient checks  blood pressure at home and has not had any elevated readings recently. Patient denies side effects from his medication. States taking it regularly.  Patient also  in for follow-up of elevated cholesterol. Doing well without complaints on current medication. Denies side effects of statin including myalgia and arthralgia and nausea. Also in today for liver function testing. Currently no chest pain, shortness of breath or other cardiovascular related symptoms noted.  Patient in for follow-up of GERD. Currently asymptomatic taking  PPI daily. There is no chest pain or heartburn. No hematemesis and no melena. No dysphagia or choking. Onset is remote. Progression is stable. Complicating factors, none.   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   He has past surgical history that includes Appendectomy (02/25/11); Transurethral resection of prostate (2004); and Colonoscopy.   His family history includes Cancer in his paternal uncle; Heart disease (age of onset: 38) in his father; Stroke (age of onset: 41) in his mother. There is no history of Colon cancer.He reports that he quit smoking about 27 years ago. He has never used smokeless tobacco. He reports that he drinks about 3.5 oz of alcohol per week. He reports that he does not use illicit drugs.  Current Outpatient Prescriptions on File Prior to Visit  Medication Sig Dispense Refill  . aspirin 81 MG tablet  Take 81 mg by mouth daily.      Marland Kitchen atenolol (TENORMIN) 50 MG tablet Take 1 tablet (50 mg total) by mouth daily. 90 tablet 1  . pantoprazole (PROTONIX) 40 MG tablet Take 1 tablet (40 mg total) by mouth daily. For stomach 90 tablet 3  . potassium chloride (KLOR-CON 10) 10 MEQ tablet Take 1 tablet (10 mEq total) by mouth daily. 90 tablet 3  . vitamin B-12 (CYANOCOBALAMIN) 1000 MCG tablet Take 1,000 mcg by mouth daily.       No current facility-administered medications on file prior to visit.    ROS Review of Systems  Constitutional: Negative for fever, chills, diaphoresis and unexpected weight change.  HENT: Negative for congestion, hearing loss, rhinorrhea and sore throat.   Eyes: Negative for visual disturbance.  Respiratory: Negative for cough and shortness of breath.   Cardiovascular: Negative for chest pain.  Gastrointestinal: Negative for abdominal pain, diarrhea and constipation.  Genitourinary: Negative for dysuria and flank pain.  Musculoskeletal: Negative for joint swelling and arthralgias.  Skin: Negative for rash.  Neurological: Negative for dizziness and headaches.  Psychiatric/Behavioral: Negative for sleep disturbance and dysphoric mood.    Objective:  BP 138/83 mmHg  Pulse 63  Temp(Src) 97.8 F (36.6 C) (Oral)  Ht '5\' 10"'$  (1.778 m)  Wt 200 lb 6.4 oz (90.901 kg)  BMI 28.75 kg/m2  SpO2 97%  BP Readings from Last 3 Encounters:  07/27/15 138/83  01/26/15 131/79  08/18/14 143/83    Wt Readings from Last 3 Encounters:  07/27/15 200 lb 6.4 oz (90.901 kg)  01/26/15 196 lb 12.8 oz (89.268 kg)  08/18/14 203 lb (92.08 kg)  Physical Exam  Constitutional: He is oriented to person, place, and time. He appears well-developed and well-nourished. No distress.  HENT:  Head: Normocephalic and atraumatic.  Right Ear: External ear normal.  Left Ear: External ear normal.  Nose: Nose normal.  Mouth/Throat: Oropharynx is clear and moist.  Eyes: Conjunctivae and EOM are  normal. Pupils are equal, round, and reactive to light.  Neck: Normal range of motion. Neck supple. No thyromegaly present.  Cardiovascular: Normal rate, regular rhythm and normal heart sounds.   No murmur heard. Pulmonary/Chest: Effort normal and breath sounds normal. No respiratory distress. He has no wheezes. He has no rales.  Abdominal: Soft. Bowel sounds are normal. He exhibits no distension. There is no tenderness.  Lymphadenopathy:    He has no cervical adenopathy.  Neurological: He is alert and oriented to person, place, and time. He has normal reflexes.  Skin: Skin is warm and dry.  Psychiatric: He has a normal mood and affect. His behavior is normal. Judgment and thought content normal.    No results found for: HGBA1C  Lab Results  Component Value Date   WBC 7.0 01/21/2015   HGB 16.8 08/18/2014   HCT 52.6* 01/21/2015   PLT 176 01/21/2015   GLUCOSE 112* 01/21/2015   CHOL 130 01/21/2015   TRIG 129 01/21/2015   HDL 40 01/21/2015   LDLCALC 64 01/21/2015   ALT 16 01/21/2015   AST 20 01/21/2015   NA 141 01/21/2015   K 4.5 01/21/2015   CL 101 01/21/2015   CREATININE 1.12 01/21/2015   BUN 15 01/21/2015   CO2 25 01/21/2015   TSH 2.210 08/18/2014   PSA 1.3 02/13/2013    Ct Abdomen Pelvis W Contrast  02/06/2011  *RADIOLOGY REPORT* Clinical Data: Abdominal pain, elevated white blood cell count CT ABDOMEN AND PELVIS WITH CONTRAST Technique:  Multidetector CT imaging of the abdomen and pelvis was performed following the standard protocol during bolus administration of intravenous contrast. Contrast: 145m OMNIPAQUE IOHEXOL 300 MG/ML IV SOLN Comparison: None. Findings: Normal hepatic contour.  Punctate calcification within the right lobe of the liver, likely the sequela of prior granulomatous infection.  Additional sub centimeter hypoattenuating lesion within the liver (image 17) is too small to accurately characterize though favored to represent a hepatic cyst.  Normal gallbladder.  Hepatic vasculature is patent.  No ascites. There is symmetric enhancement and excretion of the bilateral kidneys.  Incidental note is made of multiple left-sided parapelvic cysts.  No urinary obstruction.  No perinephric stranding. The prostate is enlarged with internal calcifications.  Incidental note is made of a tiny right-sided bladder diverticuli (image 72, series 2).  No free fluid within the pelvis.  The bilateral adrenal glands are normal.  Normal pancreas and spleen. The appendix is enlarged and is associated with adjacent mesenteric stranding and shoddy right lower quadrant mesenteric lymph nodes. No perforation or drainable fluid collection. Colonic diverticulosis without evidence of diverticulitis. Ingested contrast extends to the distal small bowel.  No evidence of enteric obstruction. No pneumoperitoneum, pneumatosis or portal venous gas.  Scattered atherosclerotic calcifications of a normal caliber abdominal aorta.  The major branch vessels of the abdominal aorta, including the IMA, are patent.  No retroperitoneal, mesenteric, pelvic or inguinal lymphadenopathy. Limited visualization of the lower thorax demonstrates linear heterogeneous opacities within the right lower lobe and inferior segment of the lingula favored to represent atelectasis.  No focal airspace opacities.  No pleural effusion.  Normal heart size.  No pericardial effusion. No acute aggressive osseous  abnormalities.  Degenerative change within the lower thoracic spine. L eft-sided mesenteric fat containing direct inguinal hernia. IMPRESSION: 1.  Findings compatible with uncomplicated acute appendicitis.  No perforation or drainable fluid collection. 2.  Colonic diverticulosis without evidence of diverticulitis.  No enteric obstruction. 3.  Multiple left-sided parapelvic cysts.  No urinary obstruction. Original Report Authenticated By: Rachel Moulds, M.D.   Assessment & Plan:   Lynn was seen today for hypertension.  Diagnoses  and all orders for this visit:  Essential hypertension -     CBC with Differential/Platelet -     CMP14+EGFR  Hyperlipemia -     CBC with Differential/Platelet -     CMP14+EGFR -     Lipid panel  Insomnia -     CBC with Differential/Platelet -     CMP14+EGFR  Gastroesophageal reflux disease without esophagitis -     CBC with Differential/Platelet -     CMP14+EGFR  Vitamin D deficiency -     CBC with Differential/Platelet -     CMP14+EGFR -     Vitamin D 1,25 dihydroxy  Vitamin B 12 deficiency -     CBC with Differential/Platelet -     CMP14+EGFR  Hyperglycemia -     Bayer DCA Hb A1c Waived  Paresthesia of both lower extremities  Paresthesia of both hands   I have discontinued Mr. Hoe traZODone and ciprofloxacin-hydrocortisone. I am also having him maintain his vitamin B-12, aspirin, atenolol, pantoprazole, and potassium chloride.  No orders of the defined types were placed in this encounter.     Follow-up: Return in about 6 months (around 01/27/2016) for CPE.  Claretta Fraise, M.D.

## 2015-07-31 LAB — CMP14+EGFR
ALBUMIN: 4.4 g/dL (ref 3.5–4.8)
ALK PHOS: 71 IU/L (ref 39–117)
ALT: 21 IU/L (ref 0–44)
AST: 24 IU/L (ref 0–40)
Albumin/Globulin Ratio: 1.9 (ref 1.2–2.2)
BILIRUBIN TOTAL: 0.8 mg/dL (ref 0.0–1.2)
BUN / CREAT RATIO: 15 (ref 10–22)
BUN: 16 mg/dL (ref 8–27)
CHLORIDE: 96 mmol/L (ref 96–106)
CO2: 24 mmol/L (ref 18–29)
CREATININE: 1.07 mg/dL (ref 0.76–1.27)
Calcium: 10.3 mg/dL — ABNORMAL HIGH (ref 8.6–10.2)
GFR calc Af Amer: 79 mL/min/{1.73_m2} (ref >59)
GFR calc non Af Amer: 68 mL/min/{1.73_m2} (ref >59)
GLOBULIN, TOTAL: 2.3 g/dL (ref 1.5–4.5)
GLUCOSE: 104 mg/dL — AB (ref 65–99)
Potassium: 5.2 mmol/L (ref 3.5–5.2)
SODIUM: 137 mmol/L (ref 134–144)
Total Protein: 6.7 g/dL (ref 6.0–8.5)

## 2015-07-31 LAB — CBC WITH DIFFERENTIAL/PLATELET
BASOS ABS: 0 10*3/uL (ref 0.0–0.2)
BASOS: 1 %
EOS (ABSOLUTE): 0.3 10*3/uL (ref 0.0–0.4)
Eos: 5 %
HEMOGLOBIN: 18.9 g/dL — AB (ref 12.6–17.7)
Hematocrit: 53.5 % — ABNORMAL HIGH (ref 37.5–51.0)
IMMATURE GRANS (ABS): 0 10*3/uL (ref 0.0–0.1)
IMMATURE GRANULOCYTES: 1 %
LYMPHS: 23 %
Lymphocytes Absolute: 1.5 10*3/uL (ref 0.7–3.1)
MCH: 32.4 pg (ref 26.6–33.0)
MCHC: 35.3 g/dL (ref 31.5–35.7)
MCV: 92 fL (ref 79–97)
MONOCYTES: 11 %
Monocytes Absolute: 0.7 10*3/uL (ref 0.1–0.9)
NEUTROS ABS: 4 10*3/uL (ref 1.4–7.0)
Neutrophils: 59 %
Platelets: 186 10*3/uL (ref 150–379)
RBC: 5.84 x10E6/uL — ABNORMAL HIGH (ref 4.14–5.80)
RDW: 14 % (ref 12.3–15.4)
WBC: 6.6 10*3/uL (ref 3.4–10.8)

## 2015-07-31 LAB — LIPID PANEL
CHOLESTEROL TOTAL: 156 mg/dL (ref 100–199)
Chol/HDL Ratio: 3.7 ratio units (ref 0.0–5.0)
HDL: 42 mg/dL (ref >39)
LDL CALC: 84 mg/dL (ref 0–99)
TRIGLYCERIDES: 149 mg/dL (ref 0–149)
VLDL Cholesterol Cal: 30 mg/dL (ref 5–40)

## 2015-07-31 LAB — VITAMIN D 1,25 DIHYDROXY
Vitamin D 1, 25 (OH)2 Total: 69 pg/mL
Vitamin D3 1, 25 (OH)2: 69 pg/mL

## 2015-08-12 ENCOUNTER — Other Ambulatory Visit: Payer: Self-pay | Admitting: Family Medicine

## 2016-01-11 ENCOUNTER — Other Ambulatory Visit: Payer: Self-pay | Admitting: Family Medicine

## 2016-02-01 ENCOUNTER — Encounter: Payer: Self-pay | Admitting: Family Medicine

## 2016-02-01 ENCOUNTER — Ambulatory Visit (INDEPENDENT_AMBULATORY_CARE_PROVIDER_SITE_OTHER): Payer: Medicare Other | Admitting: Family Medicine

## 2016-02-01 VITALS — BP 149/75 | HR 64 | Temp 97.5°F | Ht 70.0 in | Wt 201.5 lb

## 2016-02-01 DIAGNOSIS — I10 Essential (primary) hypertension: Secondary | ICD-10-CM

## 2016-02-01 DIAGNOSIS — R202 Paresthesia of skin: Secondary | ICD-10-CM

## 2016-02-01 DIAGNOSIS — K219 Gastro-esophageal reflux disease without esophagitis: Secondary | ICD-10-CM

## 2016-02-01 DIAGNOSIS — IMO0001 Reserved for inherently not codable concepts without codable children: Secondary | ICD-10-CM | POA: Insufficient documentation

## 2016-02-01 DIAGNOSIS — Z23 Encounter for immunization: Secondary | ICD-10-CM

## 2016-02-01 DIAGNOSIS — Z0000 Encounter for general adult medical examination without abnormal findings: Secondary | ICD-10-CM

## 2016-02-01 DIAGNOSIS — E538 Deficiency of other specified B group vitamins: Secondary | ICD-10-CM

## 2016-02-01 DIAGNOSIS — E785 Hyperlipidemia, unspecified: Secondary | ICD-10-CM

## 2016-02-01 LAB — URINALYSIS
BILIRUBIN UA: NEGATIVE
Ketones, UA: NEGATIVE
Leukocytes, UA: NEGATIVE
Nitrite, UA: NEGATIVE
PH UA: 5.5 (ref 5.0–7.5)
PROTEIN UA: NEGATIVE
Specific Gravity, UA: 1.02 (ref 1.005–1.030)
UUROB: 0.2 mg/dL (ref 0.2–1.0)

## 2016-02-01 MED ORDER — TETANUS-DIPHTH-ACELL PERTUSSIS 5-2.5-18.5 LF-MCG/0.5 IM SUSP: 0.5000 mL | Freq: Once | INTRAMUSCULAR | 0 refills | Status: DC

## 2016-02-01 NOTE — Progress Notes (Signed)
Subjective:  Patient ID: Hector [name], male    DOB: May 05, 1941  Age: 75 y.o. MRN: [id number]  CC: Annual Exam (pt here today for CPE, no concerns voiced)   HPI Hector [name] presents for Annual exam. He is planning a move to [facility].. He wants to have all his loose ends tied up before he goes. He recently saw his urologist and had a complete workup for hematuria. This was found to be related to his BPH and history of TURP. No abnormalities were noted on extensive workup including CT scan and cystoscopy. Additionally he has a history of colon polyps. He is due for a repeat test in the spring of 2019. He had Prevnar 1 year ago. He had Pneumovax 10 years ago when he turns 69. He is due for flu shot and tetanus today.  Patient continues to have paresthesias in the extremities. Stocking glove distribution of numbness without pain. He denies tingling or burning. It is intermittent but consistent that when he walks he feels like his feet aren't touching the ground. He continues to take oral vitamin B12. He is not sure if it's helping or not.   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   He has a past surgical history that includes Appendectomy (02/25/11); Transurethral resection of prostate (2004); and Colonoscopy.   His family history includes Cancer in his paternal uncle; Heart disease (age of onset: 35) in his father; Stroke (age of onset: 11) in his mother.He reports that he quit smoking about 27 years ago. He has never used smokeless tobacco. He reports that he drinks about 3.5 oz of alcohol per week . He reports that he does not use drugs.    ROS Review of Systems  Constitutional: Negative for activity change, appetite change, chills, diaphoresis, fatigue, fever and unexpected weight  change.  HENT: Negative for congestion, ear pain, hearing loss, postnasal drip, rhinorrhea, sore throat, tinnitus and trouble swallowing.   Eyes: Negative for photophobia, pain, discharge and redness.  Respiratory: Negative for apnea, cough, choking, chest tightness, shortness of breath, wheezing and stridor.   Cardiovascular: Negative for chest pain, palpitations and leg swelling.  Gastrointestinal: Negative for abdominal distention, abdominal pain, blood in stool, constipation, diarrhea, nausea and vomiting.  Endocrine: Negative for cold intolerance, heat intolerance, polydipsia, polyphagia and polyuria.  Genitourinary: Positive for hematuria. Negative for difficulty urinating, dysuria, enuresis, flank pain, frequency, genital sores and urgency.  Musculoskeletal: Negative for arthralgias and joint swelling.  Skin: Negative for color change, rash and wound.  Allergic/Immunologic: Negative for immunocompromised state.  Neurological: Positive for numbness. Negative for dizziness, tremors, seizures, syncope, facial asymmetry, speech difficulty, weakness, light-headedness and headaches.  Hematological: Does not bruise/bleed easily.  Psychiatric/Behavioral: Negative for agitation, behavioral problems, confusion, decreased concentration, dysphoric mood, hallucinations, sleep disturbance and suicidal ideas. The patient is not nervous/anxious and is not hyperactive.     Objective:  BP (!) 149/75   Pulse 64   Temp 97.5 F (36.4 C) (Oral)   Ht '5\' 10"'$  (1.778 m)   Wt 201 lb 8 oz (91.4 kg)   BMI 28.91 kg/m   BP Readings from Last 3 Encounters:  02/01/16 (!) 149/75  07/27/15 138/83  01/26/15 131/79    Wt Readings from Last 3 Encounters:  02/01/16 201 lb 8 oz (91.4 kg)  07/27/15 200 lb 6.4 oz (90.9 kg)  01/26/15 196  lb 12.8 oz (89.3 kg)     Physical Exam  Constitutional: He is oriented to person, place, and time. He appears well-developed and well-nourished.  HENT:  Head: Normocephalic  and atraumatic.  Mouth/Throat: Oropharynx is clear and moist.  Eyes: EOM are normal. Pupils are equal, round, and reactive to light.  Neck: Normal range of motion. No tracheal deviation present. No thyromegaly present.  Cardiovascular: Normal rate, regular rhythm and normal heart sounds.  Exam reveals no gallop and no friction rub.   No murmur heard. Pulmonary/Chest: Breath sounds normal. He has no wheezes. He has no rales.  Abdominal: Soft. He exhibits no mass. There is no tenderness.  Musculoskeletal: Normal range of motion. He exhibits no edema.  Neurological: He is alert and oriented to person, place, and time.  Skin: Skin is warm and dry.  Psychiatric: He has a normal mood and affect.     Lab Results  Component Value Date   WBC 6.6 07/27/2015   HGB 16.8 08/18/2014   HCT 53.5 (H) 07/27/2015   PLT 186 07/27/2015   GLUCOSE 104 (H) 07/27/2015   CHOL 156 07/27/2015   TRIG 149 07/27/2015   HDL 42 07/27/2015   LDLCALC 84 07/27/2015   ALT 21 07/27/2015   AST 24 07/27/2015   NA 137 07/27/2015   K 5.2 07/27/2015   CL 96 07/27/2015   CREATININE 1.07 07/27/2015   BUN 16 07/27/2015   CO2 24 07/27/2015   TSH 2.210 08/18/2014   PSA 1.3 02/13/2013       Assessment & Plan:   Hector [name] was seen today for annual exam.  Diagnoses and all orders for this visit:  Paresthesia of both hands -     CBC with Differential/Platelet -     CMP14+EGFR -     Vitamin B12  Well adult -     CBC with Differential/Platelet -     CMP14+EGFR -     Urinalysis -     Vitamin B12  Paresthesia of both lower extremities -     CBC with Differential/Platelet -     CMP14+EGFR -     Vitamin B12  Hyperlipemia -     CBC with Differential/Platelet -     CMP14+EGFR -     Lipid panel  Gastroesophageal reflux disease without esophagitis -     CBC with Differential/Platelet -     CMP14+EGFR  Essential hypertension -     CBC with Differential/Platelet -     CMP14+EGFR -     Urinalysis  Vitamin B  12 deficiency -     CBC with Differential/Platelet -     CMP14+EGFR -     Vitamin B12      I am having Hector [name] maintain his vitamin B-12, aspirin, pantoprazole, potassium chloride, and atenolol.  No orders of the defined types were placed in this encounter.    Follow-up: Return in about 6 months (around 07/31/2016).  Claretta Fraise, M.D.

## 2016-02-01 NOTE — Addendum Note (Signed)
Addended by: Marylin Crosby on: 02/01/2016 04:47 PM   Modules accepted: Orders

## 2016-02-02 LAB — CBC WITH DIFFERENTIAL/PLATELET
BASOS ABS: 0.1 10*3/uL (ref 0.0–0.2)
Basos: 1 %
EOS (ABSOLUTE): 0.4 10*3/uL (ref 0.0–0.4)
EOS: 5 %
HEMATOCRIT: 50.6 % (ref 37.5–51.0)
Hemoglobin: 17.7 g/dL (ref 12.6–17.7)
IMMATURE GRANS (ABS): 0 10*3/uL (ref 0.0–0.1)
IMMATURE GRANULOCYTES: 0 %
LYMPHS: 21 %
Lymphocytes Absolute: 1.5 10*3/uL (ref 0.7–3.1)
MCH: 31.9 pg (ref 26.6–33.0)
MCHC: 35 g/dL (ref 31.5–35.7)
MCV: 91 fL (ref 79–97)
MONOCYTES: 9 %
Monocytes Absolute: 0.7 10*3/uL (ref 0.1–0.9)
NEUTROS PCT: 64 %
Neutrophils Absolute: 4.6 10*3/uL (ref 1.4–7.0)
PLATELETS: 188 10*3/uL (ref 150–379)
RBC: 5.55 x10E6/uL (ref 4.14–5.80)
RDW: 14.2 % (ref 12.3–15.4)
WBC: 7.2 10*3/uL (ref 3.4–10.8)

## 2016-02-02 LAB — CMP14+EGFR
ALK PHOS: 60 IU/L (ref 39–117)
ALT: 22 IU/L (ref 0–44)
AST: 16 IU/L (ref 0–40)
Albumin/Globulin Ratio: 1.8 (ref 1.2–2.2)
Albumin: 4.2 g/dL (ref 3.5–4.8)
BUN/Creatinine Ratio: 17 (ref 10–24)
BUN: 18 mg/dL (ref 8–27)
Bilirubin Total: 0.6 mg/dL (ref 0.0–1.2)
CO2: 25 mmol/L (ref 18–29)
CREATININE: 1.06 mg/dL (ref 0.76–1.27)
Calcium: 9.6 mg/dL (ref 8.6–10.2)
Chloride: 100 mmol/L (ref 96–106)
GFR calc Af Amer: 79 mL/min/{1.73_m2} (ref >59)
GFR calc non Af Amer: 68 mL/min/{1.73_m2} (ref >59)
GLOBULIN, TOTAL: 2.3 g/dL (ref 1.5–4.5)
GLUCOSE: 118 mg/dL — AB (ref 65–99)
Potassium: 4.5 mmol/L (ref 3.5–5.2)
SODIUM: 138 mmol/L (ref 134–144)
Total Protein: 6.5 g/dL (ref 6.0–8.5)

## 2016-02-02 LAB — LIPID PANEL
CHOLESTEROL TOTAL: 133 mg/dL (ref 100–199)
Chol/HDL Ratio: 3.3 ratio units (ref 0.0–5.0)
HDL: 40 mg/dL (ref >39)
LDL CALC: 70 mg/dL (ref 0–99)
TRIGLYCERIDES: 117 mg/dL (ref 0–149)
VLDL Cholesterol Cal: 23 mg/dL (ref 5–40)

## 2016-02-02 LAB — VITAMIN B12: VITAMIN B 12: 525 pg/mL (ref 211–946)

## 2016-02-03 ENCOUNTER — Other Ambulatory Visit: Payer: Self-pay | Admitting: Family Medicine

## 2016-02-04 LAB — SPECIMEN STATUS REPORT

## 2016-02-04 LAB — HGB A1C W/O EAG: HEMOGLOBIN A1C: 6.1 % — AB (ref 4.8–5.6)

## 2017-08-06 ENCOUNTER — Encounter: Payer: Self-pay | Admitting: Internal Medicine

## 2017-08-16 ENCOUNTER — Encounter: Payer: Self-pay | Admitting: Internal Medicine

## 2020-02-16 ENCOUNTER — Encounter (HOSPITAL_BASED_OUTPATIENT_CLINIC_OR_DEPARTMENT_OTHER): Payer: Self-pay | Admitting: *Deleted

## 2020-02-16 ENCOUNTER — Inpatient Hospital Stay (HOSPITAL_BASED_OUTPATIENT_CLINIC_OR_DEPARTMENT_OTHER)
Admission: EM | Admit: 2020-02-16 | Discharge: 2020-02-20 | DRG: 065 | Disposition: A | Discharge status: Rehabilitation Facility | Payer: Medicare PPO | Attending: Family Medicine | Admitting: Family Medicine

## 2020-02-16 ENCOUNTER — Other Ambulatory Visit: Payer: Self-pay

## 2020-02-16 ENCOUNTER — Emergency Department (HOSPITAL_BASED_OUTPATIENT_CLINIC_OR_DEPARTMENT_OTHER): Payer: Medicare PPO

## 2020-02-16 DIAGNOSIS — C3492 Malignant neoplasm of unspecified part of left bronchus or lung: Secondary | ICD-10-CM | POA: Diagnosis present

## 2020-02-16 DIAGNOSIS — Z20822 Contact with and (suspected) exposure to covid-19: Secondary | ICD-10-CM | POA: Diagnosis present

## 2020-02-16 DIAGNOSIS — R7303 Prediabetes: Secondary | ICD-10-CM | POA: Diagnosis present

## 2020-02-16 DIAGNOSIS — I6330 Cerebral infarction due to thrombosis of unspecified cerebral artery: Secondary | ICD-10-CM | POA: Diagnosis present

## 2020-02-16 DIAGNOSIS — G459 Transient cerebral ischemic attack, unspecified: Principal | ICD-10-CM

## 2020-02-16 DIAGNOSIS — Z85118 Personal history of other malignant neoplasm of bronchus and lung: Secondary | ICD-10-CM

## 2020-02-16 DIAGNOSIS — R471 Dysarthria and anarthria: Secondary | ICD-10-CM | POA: Diagnosis present

## 2020-02-16 DIAGNOSIS — R29707 NIHSS score 7: Secondary | ICD-10-CM | POA: Diagnosis present

## 2020-02-16 DIAGNOSIS — R479 Unspecified speech disturbances: Secondary | ICD-10-CM | POA: Diagnosis present

## 2020-02-16 DIAGNOSIS — I639 Cerebral infarction, unspecified: Secondary | ICD-10-CM | POA: Insufficient documentation

## 2020-02-16 DIAGNOSIS — I6381 Other cerebral infarction due to occlusion or stenosis of small artery: Principal | ICD-10-CM | POA: Diagnosis present

## 2020-02-16 DIAGNOSIS — Z8249 Family history of ischemic heart disease and other diseases of the circulatory system: Secondary | ICD-10-CM

## 2020-02-16 DIAGNOSIS — Z823 Family history of stroke: Secondary | ICD-10-CM

## 2020-02-16 DIAGNOSIS — G8191 Hemiplegia, unspecified affecting right dominant side: Secondary | ICD-10-CM | POA: Diagnosis present

## 2020-02-16 DIAGNOSIS — Z6827 Body mass index (BMI) 27.0-27.9, adult: Secondary | ICD-10-CM

## 2020-02-16 DIAGNOSIS — D751 Secondary polycythemia: Secondary | ICD-10-CM | POA: Diagnosis present

## 2020-02-16 DIAGNOSIS — N400 Enlarged prostate without lower urinary tract symptoms: Secondary | ICD-10-CM | POA: Diagnosis present

## 2020-02-16 DIAGNOSIS — Z7982 Long term (current) use of aspirin: Secondary | ICD-10-CM

## 2020-02-16 DIAGNOSIS — R29810 Facial weakness: Secondary | ICD-10-CM | POA: Diagnosis present

## 2020-02-16 DIAGNOSIS — E785 Hyperlipidemia, unspecified: Secondary | ICD-10-CM | POA: Diagnosis present

## 2020-02-16 DIAGNOSIS — Z87891 Personal history of nicotine dependence: Secondary | ICD-10-CM

## 2020-02-16 DIAGNOSIS — Z809 Family history of malignant neoplasm, unspecified: Secondary | ICD-10-CM

## 2020-02-16 DIAGNOSIS — I10 Essential (primary) hypertension: Secondary | ICD-10-CM | POA: Diagnosis present

## 2020-02-16 DIAGNOSIS — E663 Overweight: Secondary | ICD-10-CM | POA: Diagnosis present

## 2020-02-16 DIAGNOSIS — K219 Gastro-esophageal reflux disease without esophagitis: Secondary | ICD-10-CM | POA: Diagnosis present

## 2020-02-16 DIAGNOSIS — R531 Weakness: Secondary | ICD-10-CM

## 2020-02-16 DIAGNOSIS — Z79899 Other long term (current) drug therapy: Secondary | ICD-10-CM

## 2020-02-16 HISTORY — DX: Malignant neoplasm of unspecified part of unspecified bronchus or lung: C34.90

## 2020-02-16 LAB — HEPATIC FUNCTION PANEL
ALT: 26 U/L (ref 0–44)
AST: 23 U/L (ref 15–41)
Albumin: 4.3 g/dL (ref 3.5–5.0)
Alkaline Phosphatase: 55 U/L (ref 38–126)
Bilirubin, Direct: 0.1 mg/dL (ref 0.0–0.2)
Indirect Bilirubin: 0.5 mg/dL (ref 0.3–0.9)
Total Bilirubin: 0.6 mg/dL (ref 0.3–1.2)
Total Protein: 7.2 g/dL (ref 6.5–8.1)

## 2020-02-16 LAB — URINALYSIS, MICROSCOPIC (REFLEX)

## 2020-02-16 LAB — CBC WITH DIFFERENTIAL/PLATELET
Abs Immature Granulocytes: 0.06 10*3/uL (ref 0.00–0.07)
Basophils Absolute: 0.1 10*3/uL (ref 0.0–0.1)
Basophils Relative: 1 %
Eosinophils Absolute: 0.3 10*3/uL (ref 0.0–0.5)
Eosinophils Relative: 3 %
HCT: 51.6 % (ref 39.0–52.0)
Hemoglobin: 17.5 g/dL — ABNORMAL HIGH (ref 13.0–17.0)
Immature Granulocytes: 1 %
Lymphocytes Relative: 24 %
Lymphs Abs: 1.9 10*3/uL (ref 0.7–4.0)
MCH: 31.7 pg (ref 26.0–34.0)
MCHC: 33.9 g/dL (ref 30.0–36.0)
MCV: 93.5 fL (ref 80.0–100.0)
Monocytes Absolute: 0.6 10*3/uL (ref 0.1–1.0)
Monocytes Relative: 8 %
Neutro Abs: 4.9 10*3/uL (ref 1.7–7.7)
Neutrophils Relative %: 63 %
Platelets: 217 10*3/uL (ref 150–400)
RBC: 5.52 MIL/uL (ref 4.22–5.81)
RDW: 12.6 % (ref 11.5–15.5)
WBC: 7.8 10*3/uL (ref 4.0–10.5)
nRBC: 0 % (ref 0.0–0.2)

## 2020-02-16 LAB — BASIC METABOLIC PANEL
Anion gap: 9 (ref 5–15)
BUN: 15 mg/dL (ref 8–23)
CO2: 27 mmol/L (ref 22–32)
Calcium: 9.6 mg/dL (ref 8.9–10.3)
Chloride: 101 mmol/L (ref 98–111)
Creatinine, Ser: 1.2 mg/dL (ref 0.61–1.24)
GFR, Estimated: 57 mL/min — ABNORMAL LOW (ref >60)
Glucose, Bld: 152 mg/dL — ABNORMAL HIGH (ref 70–99)
Potassium: 3.7 mmol/L (ref 3.5–5.1)
Sodium: 137 mmol/L (ref 135–145)

## 2020-02-16 LAB — URINALYSIS, ROUTINE W REFLEX MICROSCOPIC
Bilirubin Urine: NEGATIVE
Glucose, UA: NEGATIVE mg/dL
Ketones, ur: NEGATIVE mg/dL
Leukocytes,Ua: NEGATIVE
Nitrite: NEGATIVE
Protein, ur: NEGATIVE mg/dL
Specific Gravity, Urine: 1.01 (ref 1.005–1.030)
pH: 6 (ref 5.0–8.0)

## 2020-02-16 LAB — RESPIRATORY PANEL BY RT PCR (FLU A&B, COVID)
Influenza A by PCR: NEGATIVE
Influenza B by PCR: NEGATIVE
SARS Coronavirus 2 by RT PCR: NEGATIVE

## 2020-02-16 MED ORDER — ASPIRIN EC 325 MG PO TBEC
325.0000 mg | DELAYED_RELEASE_TABLET | Freq: Once | ORAL | Status: AC
  Administered 2020-02-16: 325 mg via ORAL
  Filled 2020-02-16: qty 1

## 2020-02-16 NOTE — Progress Notes (Signed)
BP (!) 141/74    Pulse 67    Temp 98.3 F (36.8 C)    Resp 14    Ht 5\' 11"  (1.803 m)    Wt 90.7 kg    SpO2 98%    BMI 27.80 kg/m   79 year old male with past medical history of smoking, and lung cancer status post resection, essential hypertension who comes into [facility] as he started having left facial numbness and some intermittent dizziness that started the day prior to admission, on the day of admission he started having inability to talk slurred speech and unsteadiness so he decided to come to the ED.  In the ED CT scan of the head showed few scattered basal ganglia lesions concerning for small stroke twelve-lead EKG shows sinus rhythm, vitals are stable with a blood pressure was 79/94 and a heart rate of 68, satting greater 98% on room air he was admitted to telemetry for stroke work-up.  Neurology has been consulted by the emergency room physician.

## 2020-02-16 NOTE — ED Provider Notes (Signed)
[place] EMERGENCY DEPARTMENT Provider Note   CSN: [id number] Arrival date & time: 02/16/20  1441     History Chief Complaint  Patient presents with  . Gait Problem    Hector [name] is a 79 y.o. male.  Patient presents with strokelike symptoms.  Yesterday maybe has some left facial numbness/burning and some intermittent dizziness.  Today he had an episode for several minutes of inability to talk, slurred speech, unsteadiness not resolved.  No history of strokes.  History of lung cancer but supposedly in remission.  Denies any infectious symptoms.  At his baseline.  The history is provided by the patient.  Neurologic Problem This is a new problem. The current episode started 3 to 5 hours ago. The problem has been resolved. Pertinent negatives include no chest pain, no abdominal pain, no headaches and no shortness of breath. Nothing aggravates the symptoms. Nothing relieves the symptoms. He has tried nothing for the symptoms. The treatment provided no relief.       Past Medical History:  Diagnosis Date  . Diverticulitis 04/2012   admission [facility]  . Former smoker    Smoked for 20 yrs;  Quit  25 yrs ago.  Marland Kitchen GERD (gastroesophageal reflux disease)   . Hypertension   . Hypokalemia   . Lung cancer ([place])   . 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 Polycythemia   . Prostatitis    S/P  TURP    Patient Active Problem List   Diagnosis Date Noted  . Well adult 02/01/2016  . Hyperlipemia 07/27/2015  . Insomnia 07/27/2015  . Gastroesophageal reflux disease without esophagitis 07/27/2015  . Paresthesia of both lower extremities 01/26/2015  . Paresthesia of both hands 01/26/2015  . Essential hypertension 01/26/2015  . Personal history of colonic adenoma 08/08/2012  . Polycythemia 11/23/2011    Past Surgical History:  Procedure Laterality Date  . APPENDECTOMY  02/25/11  . COLONOSCOPY    . LOBECTOMY    . TRANSURETHRAL RESECTION OF PROSTATE   2004       Family History  Problem Relation Age of Onset  . Stroke Mother 81  . Heart disease Father 20  . Cancer Paternal Uncle        Carcinoma of unknown origin.  . Colon cancer Neg Hx     Social History   Tobacco Use  . Smoking status: Former Smoker    Quit date: 07/25/1988    Years since quitting: 31.5  . Smokeless tobacco: Never Used  Substance Use Topics  . Alcohol use: Yes    Alcohol/week: 7.0 standard drinks    Types: 7 drink(s) per week  . Drug use: No    Home Medications Prior to Admission medications   Medication Sig Start Date End Date Taking? Authorizing Provider  aspirin 81 MG tablet Take 81 mg by mouth daily.      [provider]  atenolol (TENORMIN) 50 MG tablet TAKE ONE (1) TABLET EACH DAY 02/03/16   Claretta Fraise, MD  pantoprazole (PROTONIX) 40 MG tablet Take 1 tablet (40 mg total) by mouth daily. For stomach 01/26/15   Claretta Fraise, MD  potassium chloride (K-DUR) 10 MEQ tablet TAKE ONE (1) TABLET EACH DAY 02/03/16   Claretta Fraise, MD  vitamin B-12 (CYANOCOBALAMIN) 1000 MCG tablet Take 1,000 mcg by mouth daily.      [provider]    Allergies    Patient has no known allergies.  Review of Systems   Review of Systems  Constitutional: Negative for chills and fever.  HENT: Negative for ear pain and sore throat.   Eyes: Negative for pain and visual disturbance.  Respiratory: Negative for cough and shortness of breath.   Cardiovascular: Negative for chest pain and palpitations.  Gastrointestinal: Negative for abdominal pain and vomiting.  Genitourinary: Negative for dysuria and hematuria.  Musculoskeletal: Negative for arthralgias and back pain.  Skin: Negative for color change and rash.  Neurological: Positive for dizziness and speech difficulty. Negative for seizures, syncope, weakness and headaches.  All other systems reviewed and are negative.   Physical Exam Updated Vital Signs BP (!) 144/71   Pulse 64   Temp 98.3 F  (36.8 C)   Resp (!) 22   Ht 5\' 11"  (1.803 m)   Wt 90.7 kg   SpO2 98%   BMI 27.89 kg/m   Physical Exam Vitals and nursing note reviewed.  Constitutional:      General: He is not in acute distress.    Appearance: He is well-developed. He is not ill-appearing.  HENT:     Head: Normocephalic and atraumatic.  Eyes:     Extraocular Movements: Extraocular movements intact.     Conjunctiva/sclera: Conjunctivae normal.     Pupils: Pupils are equal, round, and reactive to light.  Cardiovascular:     Rate and Rhythm: Normal rate and regular rhythm.     Pulses: Normal pulses.     Heart sounds: No murmur heard.   Pulmonary:     Effort: Pulmonary effort is normal. No respiratory distress.     Breath sounds: Normal breath sounds.  Abdominal:     Palpations: Abdomen is soft.     Tenderness: There is no abdominal tenderness.  Musculoskeletal:        General: Normal range of motion.     Cervical back: Normal range of motion and neck supple.  Skin:    General: Skin is warm and dry.     Capillary Refill: Capillary refill takes less than 2 seconds.  Neurological:     General: No focal deficit present.     Mental Status: He is alert and oriented to person, place, and time.     Cranial Nerves: No cranial nerve deficit.     Sensory: No sensory deficit.     Motor: No weakness.     Coordination: Coordination normal.     Gait: Gait normal.     Comments: 5+ out of 5 strength throughout, normal sensation, normal speech, no facial droop, normal finger-to-nose finger, normal gait, no visual field deficit     ED Results / Procedures / Treatments   Labs (all labs ordered are listed, but only abnormal results are displayed) Labs Reviewed  CBC WITH DIFFERENTIAL/PLATELET - Abnormal; Notable for the following components:      Result Value   Hemoglobin 17.5 (*)    All other components within normal limits  BASIC METABOLIC PANEL - Abnormal; Notable for the following components:   Glucose, Bld 152  (*)    GFR, Estimated 57 (*)    All other components within normal limits  RESPIRATORY PANEL BY RT PCR (FLU A&B, COVID)  HEPATIC FUNCTION PANEL  URINALYSIS, ROUTINE W REFLEX MICROSCOPIC    EKG EKG Interpretation  Date/Time:  Monday February 16 2020 14:54:45 EDT Ventricular Rate:  69 PR Interval:    QRS Duration: 107 QT Interval:  403 QTC Calculation: 432 R Axis:   -43 Text Interpretation: Sinus rhythm Left anterior fascicular block Abnormal R-wave progression, early transition  Probable left ventricular hypertrophy Baseline wander in lead(s) V1 V3 Confirmed by Lennice Sites [phone]) on 02/16/2020 3:56:56 PM   Radiology CT Head Wo Contrast  Result Date: 02/16/2020 CLINICAL DATA:  Left facial burning and gait instability, slurred speech noted at 1200 hours EXAM: CT HEAD WITHOUT CONTRAST TECHNIQUE: Contiguous axial images were obtained from the base of the skull through the vertex without intravenous contrast. COMPARISON:  None. 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 No large CT evident area large vascular territory or cortically based infarction. No evidence of hemorrhage, hydrocephalus, extra-axial collection, visible mass lesion or mass effect. Symmetric prominence of the ventricles, cisterns and sulci compatible with parenchymal volume loss. Patchy areas of white matter hypoattenuation are most compatible with chronic microvascular angiopathy. Vascular: Atherosclerotic calcification of the carotid siphons and intradural vertebral arteries. No hyperdense vessel. Skull: No calvarial fracture or suspicious osseous lesion. No scalp swelling or hematoma. Sinuses/Orbits: Mild thickening in the paranasal sinuses, left greater than right. No air-fluid levels are pneumatized secretions. Mastoid air cells are predominantly clear with  pneumatization of the petrous apices. Included orbital structures are unremarkable. Other: None. IMPRESSION: 1. 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 2. No large CT evident area of large vascular territory or cortically based infarction or other acute intracranial abnormality. If there is persisting concern for acute infarction, MRI is more sensitive and specific for early features of ischemia. 3. Parenchymal volume loss and chronic microvascular angiopathy. 4. Intracranial atherosclerosis. Electronically Signed   By: Lovena Le M.D.   On: 02/16/2020 15:33   DG Chest Portable 1 View  Result Date: 02/16/2020 CLINICAL DATA:  Dizziness EXAM: PORTABLE CHEST 1 VIEW COMPARISON:  None. FINDINGS: The heart size and mediastinal contours are within normal limits. Both lungs are clear. The visualized skeletal structures are unremarkable. IMPRESSION: No active disease. Electronically Signed   By: Inez Catalina M.D.   On: 02/16/2020 15:45    Procedures Procedures (including critical care time)  Medications Ordered in ED Medications - No data to display  ED Course  I have reviewed the triage vital signs and the nursing notes.  Pertinent labs & imaging results that were available during my care of the patient were reviewed by me and considered in my medical decision making (see chart for details).    MDM Rules/Calculators/A&P                          Hector [name] is a 79 year old male history of lung cancer who presents to the ED with strokelike symptoms.  Unremarkable vitals.  No fever.  Has had 2 episodes of strokelike symptoms.  1 yesterday with may be left-sided facial numbness, imbalance and dizziness that resolved after a little bit of time.  Another episode around 1130 today of slurred speech, inability to get words out, unsteadiness that resolved after about 5 to  10 minutes.  He is at his baseline now.  Neurologically intact.  No current stroke symptoms.  History of lung cancer that was treated surgically.  No history of stroke.  Lab work overall is unremarkable.  No significant anemia, electrolyte abnormality, kidney injury.  Chest x-ray shows no signs of infection.  Head CT showed possibly some changes to suggest remote lacunar  infarct with areas of hypoattenuation.  Talked with neurology at [facility] who recommends admission for TIA work-up.  Patient to be admitted to [facility].  Hemodynamically stable throughout my care.  This chart was dictated using voice recognition software.  Despite best efforts to proofread,  errors can occur which can change the documentation meaning.    Final Clinical Impression(s) / ED Diagnoses Final diagnoses:  TIA (transient ischemic attack)    Rx / DC Orders ED Discharge Orders    None       Lennice Sites, DO 02/16/20 1645

## 2020-02-16 NOTE — ED Triage Notes (Addendum)
Pt states yesterday 11:30 am c/o left facial burning and unstable gait, today c/o weakness and unsteady gait and restless , slurred speach noted by daughtert at 12:00 today  denies H/a

## 2020-02-17 ENCOUNTER — Inpatient Hospital Stay (HOSPITAL_COMMUNITY): Payer: Medicare PPO

## 2020-02-17 ENCOUNTER — Encounter (HOSPITAL_BASED_OUTPATIENT_CLINIC_OR_DEPARTMENT_OTHER): Payer: Self-pay | Admitting: Internal Medicine

## 2020-02-17 ENCOUNTER — Emergency Department (HOSPITAL_BASED_OUTPATIENT_CLINIC_OR_DEPARTMENT_OTHER): Payer: Medicare PPO

## 2020-02-17 DIAGNOSIS — Z85118 Personal history of other malignant neoplasm of bronchus and lung: Secondary | ICD-10-CM | POA: Diagnosis not present

## 2020-02-17 DIAGNOSIS — R29707 NIHSS score 7: Secondary | ICD-10-CM | POA: Diagnosis present

## 2020-02-17 DIAGNOSIS — Z823 Family history of stroke: Secondary | ICD-10-CM | POA: Diagnosis not present

## 2020-02-17 DIAGNOSIS — R29810 Facial weakness: Secondary | ICD-10-CM | POA: Diagnosis present

## 2020-02-17 DIAGNOSIS — R479 Unspecified speech disturbances: Secondary | ICD-10-CM | POA: Diagnosis present

## 2020-02-17 DIAGNOSIS — I10 Essential (primary) hypertension: Secondary | ICD-10-CM | POA: Diagnosis present

## 2020-02-17 DIAGNOSIS — N179 Acute kidney failure, unspecified: Secondary | ICD-10-CM | POA: Diagnosis not present

## 2020-02-17 DIAGNOSIS — E785 Hyperlipidemia, unspecified: Secondary | ICD-10-CM | POA: Diagnosis present

## 2020-02-17 DIAGNOSIS — I6389 Other cerebral infarction: Secondary | ICD-10-CM

## 2020-02-17 DIAGNOSIS — Z8249 Family history of ischemic heart disease and other diseases of the circulatory system: Secondary | ICD-10-CM | POA: Diagnosis not present

## 2020-02-17 DIAGNOSIS — I6330 Cerebral infarction due to thrombosis of unspecified cerebral artery: Secondary | ICD-10-CM | POA: Diagnosis not present

## 2020-02-17 DIAGNOSIS — I69351 Hemiplegia and hemiparesis following cerebral infarction affecting right dominant side: Secondary | ICD-10-CM | POA: Diagnosis not present

## 2020-02-17 DIAGNOSIS — N400 Enlarged prostate without lower urinary tract symptoms: Secondary | ICD-10-CM | POA: Diagnosis present

## 2020-02-17 DIAGNOSIS — K219 Gastro-esophageal reflux disease without esophagitis: Secondary | ICD-10-CM | POA: Diagnosis present

## 2020-02-17 DIAGNOSIS — R471 Dysarthria and anarthria: Secondary | ICD-10-CM | POA: Diagnosis present

## 2020-02-17 DIAGNOSIS — R531 Weakness: Secondary | ICD-10-CM

## 2020-02-17 DIAGNOSIS — G8191 Hemiplegia, unspecified affecting right dominant side: Secondary | ICD-10-CM | POA: Diagnosis present

## 2020-02-17 DIAGNOSIS — G459 Transient cerebral ischemic attack, unspecified: Secondary | ICD-10-CM | POA: Diagnosis not present

## 2020-02-17 DIAGNOSIS — D751 Secondary polycythemia: Secondary | ICD-10-CM | POA: Diagnosis not present

## 2020-02-17 DIAGNOSIS — R7303 Prediabetes: Secondary | ICD-10-CM | POA: Diagnosis present

## 2020-02-17 DIAGNOSIS — I639 Cerebral infarction, unspecified: Secondary | ICD-10-CM | POA: Diagnosis not present

## 2020-02-17 DIAGNOSIS — Z79899 Other long term (current) drug therapy: Secondary | ICD-10-CM | POA: Diagnosis not present

## 2020-02-17 DIAGNOSIS — Z7982 Long term (current) use of aspirin: Secondary | ICD-10-CM | POA: Diagnosis not present

## 2020-02-17 DIAGNOSIS — Z809 Family history of malignant neoplasm, unspecified: Secondary | ICD-10-CM | POA: Diagnosis not present

## 2020-02-17 DIAGNOSIS — Z87891 Personal history of nicotine dependence: Secondary | ICD-10-CM | POA: Diagnosis not present

## 2020-02-17 DIAGNOSIS — Z20822 Contact with and (suspected) exposure to covid-19: Secondary | ICD-10-CM | POA: Diagnosis present

## 2020-02-17 DIAGNOSIS — I6381 Other cerebral infarction due to occlusion or stenosis of small artery: Secondary | ICD-10-CM | POA: Diagnosis present

## 2020-02-17 DIAGNOSIS — E663 Overweight: Secondary | ICD-10-CM | POA: Diagnosis present

## 2020-02-17 DIAGNOSIS — Z6827 Body mass index (BMI) 27.0-27.9, adult: Secondary | ICD-10-CM | POA: Diagnosis not present

## 2020-02-17 DIAGNOSIS — C3492 Malignant neoplasm of unspecified part of left bronchus or lung: Secondary | ICD-10-CM | POA: Diagnosis present

## 2020-02-17 LAB — COMPREHENSIVE METABOLIC PANEL
ALT: 29 U/L (ref 0–44)
AST: 22 U/L (ref 15–41)
Albumin: 4 g/dL (ref 3.5–5.0)
Alkaline Phosphatase: 48 U/L (ref 38–126)
Anion gap: 9 (ref 5–15)
BUN: 12 mg/dL (ref 8–23)
CO2: 26 mmol/L (ref 22–32)
Calcium: 9.5 mg/dL (ref 8.9–10.3)
Chloride: 103 mmol/L (ref 98–111)
Creatinine, Ser: 1.19 mg/dL (ref 0.61–1.24)
GFR, Estimated: 58 mL/min — ABNORMAL LOW (ref >60)
Glucose, Bld: 139 mg/dL — ABNORMAL HIGH (ref 70–99)
Potassium: 4.1 mmol/L (ref 3.5–5.1)
Sodium: 138 mmol/L (ref 135–145)
Total Bilirubin: 0.9 mg/dL (ref 0.3–1.2)
Total Protein: 6.6 g/dL (ref 6.5–8.1)

## 2020-02-17 LAB — CBC
HCT: 50 % (ref 39.0–52.0)
Hemoglobin: 17 g/dL (ref 13.0–17.0)
MCH: 31.2 pg (ref 26.0–34.0)
MCHC: 34 g/dL (ref 30.0–36.0)
MCV: 91.7 fL (ref 80.0–100.0)
Platelets: 181 10*3/uL (ref 150–400)
RBC: 5.45 MIL/uL (ref 4.22–5.81)
RDW: 12.3 % (ref 11.5–15.5)
WBC: 7 10*3/uL (ref 4.0–10.5)
nRBC: 0 % (ref 0.0–0.2)

## 2020-02-17 LAB — TSH: TSH: 1.444 u[IU]/mL (ref 0.350–4.500)

## 2020-02-17 LAB — LIPID PANEL
Cholesterol: 148 mg/dL (ref 0–200)
HDL: 35 mg/dL — ABNORMAL LOW (ref >40)
LDL Cholesterol: 77 mg/dL (ref 0–99)
Total CHOL/HDL Ratio: 4.2 RATIO
Triglycerides: 179 mg/dL — ABNORMAL HIGH (ref <150)
VLDL: 36 mg/dL (ref 0–40)

## 2020-02-17 LAB — ECHOCARDIOGRAM COMPLETE
Area-P 1/2: 2.97 cm2
Height: 71 in
S' Lateral: 2.2 cm
Weight: 3200 oz

## 2020-02-17 LAB — HEMOGLOBIN A1C
Hgb A1c MFr Bld: 6.2 % — ABNORMAL HIGH (ref 4.8–5.6)
Mean Plasma Glucose: 131.24 mg/dL

## 2020-02-17 LAB — VITAMIN B12: Vitamin B-12: 514 pg/mL (ref 180–914)

## 2020-02-17 MED ORDER — SODIUM CHLORIDE 0.9 % IV BOLUS
1000.0000 mL | Freq: Once | INTRAVENOUS | Status: AC
  Administered 2020-02-17: 1000 mL via INTRAVENOUS

## 2020-02-17 MED ORDER — ACETAMINOPHEN 160 MG/5ML PO SOLN: 650.0000 mg | ORAL | Status: DC | PRN

## 2020-02-17 MED ORDER — ACETAMINOPHEN 325 MG PO TABS: 650.0000 mg | ORAL_TABLET | ORAL | Status: DC | PRN

## 2020-02-17 MED ORDER — CLOPIDOGREL BISULFATE 75 MG PO TABS
75.0000 mg | ORAL_TABLET | Freq: Every day | ORAL | Status: DC
  Administered 2020-02-18 – 2020-02-20 (×3): 75 mg via ORAL
  Filled 2020-02-17 (×3): qty 1

## 2020-02-17 MED ORDER — ATENOLOL 25 MG PO TABS: 50.0000 mg | ORAL_TABLET | Freq: Every day | ORAL | Status: DC

## 2020-02-17 MED ORDER — SODIUM CHLORIDE 0.9 % IV SOLN: INTRAVENOUS | Status: DC

## 2020-02-17 MED ORDER — CLOPIDOGREL BISULFATE 75 MG PO TABS
300.0000 mg | ORAL_TABLET | Freq: Once | ORAL | Status: AC
  Administered 2020-02-17: 300 mg via ORAL
  Filled 2020-02-17: qty 4

## 2020-02-17 MED ORDER — IOHEXOL 350 MG/ML SOLN
100.0000 mL | Freq: Once | INTRAVENOUS | Status: AC | PRN
  Administered 2020-02-17: 100 mL via INTRAVENOUS

## 2020-02-17 MED ORDER — LOSARTAN POTASSIUM 25 MG PO TABS: 25.0000 mg | ORAL_TABLET | Freq: Every day | ORAL | Status: DC

## 2020-02-17 MED ORDER — SENNOSIDES-DOCUSATE SODIUM 8.6-50 MG PO TABS: 1.0000 | ORAL_TABLET | Freq: Every evening | ORAL | Status: DC | PRN

## 2020-02-17 MED ORDER — STROKE: EARLY STAGES OF RECOVERY BOOK: Freq: Once | Status: AC

## 2020-02-17 MED ORDER — ACETAMINOPHEN 650 MG RE SUPP: 650.0000 mg | RECTAL | Status: DC | PRN

## 2020-02-17 MED ORDER — ASPIRIN 300 MG RE SUPP: 300.0000 mg | Freq: Every day | RECTAL | Status: DC

## 2020-02-17 MED ORDER — ASPIRIN 325 MG PO TABS
325.0000 mg | ORAL_TABLET | Freq: Every day | ORAL | Status: DC
  Administered 2020-02-17: 325 mg via ORAL
  Filled 2020-02-17: qty 1

## 2020-02-17 MED ORDER — ENOXAPARIN SODIUM 40 MG/0.4ML ~~LOC~~ SOLN
40.0000 mg | SUBCUTANEOUS | Status: DC
  Administered 2020-02-17: 40 mg via SUBCUTANEOUS
  Filled 2020-02-17: qty 0.4

## 2020-02-17 NOTE — Progress Notes (Signed)
  Echocardiogram 2D Echocardiogram has been performed.  Jannett Celestine 02/17/2020, 2:34 PM

## 2020-02-17 NOTE — ED Provider Notes (Addendum)
Seen and examined.    PERRL, EOMI MMM RRR CTAB NABS  No facial weakness nor numbness. Difficulty with word finding.  5/5 strength, cranial nerves otherwise intact   305 case d/w Dr. Leonel Ramsay. Not a code stroke based on time course and current symptoms.  Please CTA head and neck.  If positive transfer immediately.  If negative will need MRI but not emergent transfer   CTA without LVO.   Awaiting transfer for admission              De Libman, MD 02/17/20 [phone]

## 2020-02-17 NOTE — Assessment & Plan Note (Signed)
-   follows outpatient with [facility] oncology, last seen 01/15/20 - s/p previous laser ablations with residual tumor afterwards. Then treated with bronchoscopy, left VATS lower lobe sleeve lobectomy and MLND on 09/26/2019.

## 2020-02-17 NOTE — Progress Notes (Signed)
PT Cancellation Note  Patient Details Name: Hector [name] MRN: [id number] DOB: 12-23-1940   Cancelled Treatment:    Reason Eval/Treat Not Completed: Patient at procedure or test/unavailable - going for MRI, will check back as schedule allows.  Marisa Cyphers, PT Acute Rehabilitation Services Pager [phone]  Office [phone]     Julien Girt 02/17/2020, 4:01 PM

## 2020-02-17 NOTE — Consult Note (Addendum)
Neurology Consultation  Reason for Consult: Stroke/right-sided weakness Referring Physician: Dwyane Dee, MD  CC: Right-sided weakness  History is obtained from: Daughter and patient  HPI: Hector [name] is a 79 y.o. male with past medical history of polycythemia, lung cancer, hypertension and former smoker.  This past Sunday at approximately 12 noon patient felt funny.  Suddenly left side of his face started to burn and he felt restless.  He also notes when he got up he felt very wobbly and unsteady with his gait.  He states that this lasted for about 15 minutes and then fully resolved.  The next day on Monday he had taken a nap on the couch when approximately 12:15 in the afternoon his daughter came over and noted that he was very wobbly and his speech was not clearing he was having difficulty finding words.  This lasted for approximately 15 to 30 minutes.  Due to this his daughter decided that he must go to the ED to be evaluated.  He went to the [facility] regional where neurology was consulted.  Recommendations were to get a CTA if normal transferred to [facility] in order to obtain MRI.  Last night at approximately 9:30 PM his daughter went to check on him and he was normal.  At 0200 on 02/17/20, she saw patient and she noticed significant difference in his speech.  While this was occurring he did obtain a CTA of head and neck about and hour and half into his symptoms which did not show any large vessel occlusion.  His symptoms have since then worsened.  He now has increased dysarthria, right facial droop, inability to lift his right arm and weakness in his right leg.  Patient states he does take aspirin on a daily basis and does take a blood pressure medication.   LKW: 9:30 PM on 02/16/2020 tpa given?: no, out of window Premorbid modified Rankin scale (mRS): 0 NIHSS 1a Level of Conscious.: 0 1b LOC Questions: 0 1c LOC Commands: 0 2 Best Gaze: 0 3 Visual: 0 4 Facial Palsy: 1 5a Motor Arm -  left: 0 5b Motor Arm - Right: 3 6a Motor Leg - Left: 0 6b Motor Leg - Right: 1 7 Limb Ataxia: 0 8 Sensory: 0 9 Best Language: 0 10 Dysarthria: 2 11 Extinct. and Inatten.: 0 TOTAL: 7  Past Medical History:  Diagnosis Date   Diverticulitis 04/2012   admission [facility]   Former smoker    Smoked for 20 yrs;  Quit  25 yrs ago.   GERD (gastroesophageal reflux disease)    Hypertension    Hypokalemia    Lung cancer ([facility])    Personal history of colonic adenoma 04/2009   Polycythemia    Prostatitis    S/P  TURP    Family History  Problem Relation Age of Onset   Stroke Mother 3   Heart disease Father 6   Cancer Paternal Uncle        Carcinoma of unknown origin.   Colon cancer Neg Hx    Social History:   reports that he quit smoking about 31 years ago. He has never used smokeless tobacco. He reports current alcohol use of about 7.0 standard drinks of alcohol per week. He reports that he does not use drugs.  Medications  Current Facility-Administered Medications:    0.9 %  sodium chloride infusion, , Intravenous, Continuous, Dwyane Dee, MD, Last Rate: 50 mL/hr at 02/17/20 1323, New Bag at 02/17/20 1323   acetaminophen (  TYLENOL) tablet 650 mg, 650 mg, Oral, Q4H PRN **OR** acetaminophen (TYLENOL) 160 MG/5ML solution 650 mg, 650 mg, Per Tube, Q4H PRN **OR** acetaminophen (TYLENOL) suppository 650 mg, 650 mg, Rectal, Q4H PRN, Dwyane Dee, MD   aspirin suppository 300 mg, 300 mg, Rectal, Daily **OR** aspirin tablet 325 mg, 325 mg, Oral, Daily, Dwyane Dee, MD, 325 mg at 02/17/20 1331   enoxaparin (LOVENOX) injection 40 mg, 40 mg, Subcutaneous, Q24H, Girguis, Bryonna Sundby, MD   senna-docusate (Senokot-S) tablet 1 tablet, 1 tablet, Oral, QHS PRN, Dwyane Dee, MD  ROS:    General ROS: negative for - chills, fatigue, fever, night sweats, weight gain or weight loss Psychological ROS: negative for - behavioral disorder, hallucinations, memory difficulties, mood swings or  suicidal ideation Ophthalmic ROS: negative for - blurry vision, double vision, eye pain or loss of vision ENT ROS: negative for - epistaxis, nasal discharge, oral lesions, sore throat, tinnitus or vertigo Allergy and Immunology ROS: negative for - hives or itchy/watery eyes Hematological and Lymphatic ROS: negative for - bleeding problems, bruising or swollen lymph nodes Endocrine ROS: negative for - galactorrhea, hair pattern changes, polydipsia/polyuria or temperature intolerance Respiratory ROS: negative for - cough, hemoptysis, shortness of breath or wheezing Cardiovascular ROS: negative for - chest pain, dyspnea on exertion, edema or irregular heartbeat Gastrointestinal ROS: negative for - abdominal pain, diarrhea, hematemesis, nausea/vomiting or stool incontinence Genito-Urinary ROS: negative for - dysuria, hematuria, incontinence or urinary frequency/urgency Musculoskeletal ROS: Positive for -  muscular weakness Neurological ROS: as noted in HPI Dermatological ROS: negative for rash and skin lesion changes  Exam: Current vital signs: BP 137/89 (BP Location: Left Arm)   Pulse 68   Temp 97.9 F (36.6 C) (Oral)   Resp 20   Ht 5\' 11"  (1.803 m)   Wt 90.7 kg   SpO2 96%   BMI 27.89 kg/m  Vital signs in last 24 hours: Temp:  [97.9 F (36.6 C)-98.1 F (36.7 C)] 97.9 F (36.6 C) (10/12 1126) Pulse Rate:  [59-68] 68 (10/12 1126) Resp:  [13-22] 20 (10/12 1126) BP: (130-167)/(66-103) 137/89 (10/12 1126) SpO2:  [95 %-98 %] 96 % (10/12 1126)   Constitutional: Appears well-developed and well-nourished.  Psych: Affect appropriate to situation Eyes: No scleral injection HENT: No OP obstrucion Head: Normocephalic.  Cardiovascular: Normal rate and regular rhythm.  Respiratory: Effort normal, non-labored breathing GI: Soft.  No distension. There is no tenderness.  Skin: WDI  Neuro: Mental Status: Patient is awake, alert, oriented to person, place, month, year, and  situation. Speech-dysarthric with intact naming, repeating and comprehension.  Patient is able to give a coherent history.  Patient is able to follow commands with no problems. Cranial Nerves: II: Visual Fields are full.  III,IV, VI: EOMI without ptosis or diploplia. Pupils equal, round and reactive to light V: Facial sensation is symmetric to temperature VII: Right facial droop.  VIII: hearing is intact to voice X: Palat elevates symmetrically XI: Shoulder shrug is symmetric. XII: tongue is midline without atrophy or fasciculations.  Motor: Patient is able to lift his right arm approximately 1 inch off the bed and wiggle his fingers however has no abduction strength.,  Patient is able to lift his right leg approximately 3 inches off the bed and hold it for 5 seconds however is a 4 -/5.  Left arm and leg 5/5. Sensory: Sensation is symmetric to light touch and temperature in the arms and legs. Deep Tendon Reflexes: 2+ and symmetric in the biceps and patellae.  Positive cross  adductors with knee jerk Plantars: Right toe upgoing left toe downgoing Cerebellar: Left finger-to-nose and heel-to-shin shows no dysmetria.  Unable to do right finger-nose and or right heel-to-shin.   CBC    Component Value Date/Time   WBC 7.0 02/17/2020 1236   RBC 5.45 02/17/2020 1236   HGB 17.0 02/17/2020 1236   HGB 17.7 02/01/2016 1204   HGB 14.1 11/23/2011 0831   HCT 50.0 02/17/2020 1236   HCT 50.6 02/01/2016 1204   HCT 43.6 11/23/2011 0831   PLT 181 02/17/2020 1236   PLT 188 02/01/2016 1204   MCV 91.7 02/17/2020 1236   MCV 91 02/01/2016 1204   MCV 81.7 11/23/2011 0831   MCH 31.2 02/17/2020 1236   MCHC 34.0 02/17/2020 1236   RDW 12.3 02/17/2020 1236   RDW 14.2 02/01/2016 1204   RDW 15.8 (H) 11/23/2011 0831   LYMPHSABS 1.9 02/16/2020 1458   LYMPHSABS 1.5 02/01/2016 1204   LYMPHSABS 1.3 11/23/2011 0831   MONOABS 0.6 02/16/2020 1458   MONOABS 0.5 11/23/2011 0831   EOSABS 0.3 02/16/2020 1458    EOSABS 0.4 02/01/2016 1204   BASOSABS 0.1 02/16/2020 1458   BASOSABS 0.1 02/01/2016 1204   BASOSABS 0.1 11/23/2011 0831    CMP     Component Value Date/Time   NA 138 02/17/2020 1236   NA 138 02/01/2016 1204   K 4.1 02/17/2020 1236   CL 103 02/17/2020 1236   CO2 26 02/17/2020 1236   GLUCOSE 139 (H) 02/17/2020 1236   BUN 12 02/17/2020 1236   BUN 18 02/01/2016 1204   CREATININE 1.19 02/17/2020 1236   CALCIUM 9.5 02/17/2020 1236   PROT 6.6 02/17/2020 1236   PROT 6.5 02/01/2016 1204   ALBUMIN 4.0 02/17/2020 1236   ALBUMIN 4.2 02/01/2016 1204   AST 22 02/17/2020 1236   ALT 29 02/17/2020 1236   ALKPHOS 48 02/17/2020 1236   BILITOT 0.9 02/17/2020 1236   BILITOT 0.6 02/01/2016 1204   GFRNONAA 58 (L) 02/17/2020 1236   GFRAA 79 02/01/2016 1204    Lipid Panel     Component Value Date/Time   CHOL 148 02/17/2020 1236   CHOL 133 02/01/2016 1204   TRIG 179 (H) 02/17/2020 1236   HDL 35 (L) 02/17/2020 1236   HDL 40 02/01/2016 1204   CHOLHDL 4.2 02/17/2020 1236   VLDL 36 02/17/2020 1236   LDLCALC 77 02/17/2020 1236   LDLCALC 70 02/01/2016 1204   Hemoglobin A1c 6.2  Imaging I have reviewed the images obtained:  CT-scan of the brain-few scattered hypoattenuating foci in bilateral basal ganglia and left external capsule which may reflect areas of age indeterminant though likely remote lacunar type infarcts.  No large CT evident area of large vascular territory or cortically based infarct or other acute intracranial abnormality.  Parenchymal volume loss and chronic microvascular angiopathy.  MRI examination of the brain-pending  Echocardiogram shows EF of 60 to 65%.  No PFO noted  Etta Quill PA-C [facility] [phone]  M-F  (9:00 am- 5:00 PM)  02/17/2020, 3:05 PM     Assessment:  This is a 79 year old male presenting to the hospital secondary to 3 distinct episodes concerning for a CVA. The 2 initial episodes lasted for approximately 20 to 30 minutes and  concerning for TIAs. The 3rd episode however, has been going on for several hours and he now has slurred speech, R facial droop, worsened into the morning with right arm and leg weakness. Some concern for a stuttering lacune specially given the lack  of cortical signs. Another consideration is potential coagulopathy vs cardioembolic phenomena. With his chronically elevated hemoglobin, he may have underlying polycythemia too. We will expedite the MRI Brain, keep the head of bed flat, push fluids, load him up with plavix.  Impression: -Stroke Recommendations:  - I ordered STAT brain imaging with MRI Brain without contrast - CTA head and Neck are negative for an LVO - TTE  is pending. - Lipid panel with LDL - Please start statin if LDL > 70 - HbA1c is pending. - Antithrombotic - Aspirin 81mg  daily, Plavix 300mg  load and 75mg  daily for 21 days. - Recommend DVT ppx - SBP goal - permissive hypertension first 24 h < 220/110. Hold home meds. - I gave ghim a fluid bolus and started him on 127ml/hr. - Recommend Telemetry monitoring for arrythmia - Recommend bedside swallow screen prior to PO intake. - Stroke education booklet - Recommend PT/OT/SLP consult - Keep head of bed flat for tonight.

## 2020-02-17 NOTE — Hospital Course (Addendum)
Hector [name] is a 79 yo CM with PMH atypical carcinoid tumor,, BPH s/p TURP, HTN, GERD, diverticulitis who initially presented to [facility] on Monday after he had developed right sided weakness over the weekend as well as a change in his speech.  He states that he last felt normal on Saturday and waking up on Sunday morning but shortly after, he noted that his left face felt tingly and he then started to have difficulty with his speech (per daughter, was skipping some words when talking but he did appear to understand conversations as well as answer questions properly, he just occasionally forgot some words, as well as had some slurring of his speech); he also had right-sided weakness to the point of having an unstable gait.   Most of his symptoms resolved later on Sunday but again recurred on Monday and did not improve, thus he sought attention. He underwent imaging studies given the suspicion for stroke. 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 CTA head/neck was also negative for LVO or significant carotid stenosis.  He was again noted to have chronic ischemic microangiopathy and generalized atrophy.  He was transferred to [facility] for MRI brain and further stroke work-up.  Of note, prior office notes dating back to 2017 revealed that the patient had complaints of paresthesias in his extremities and stocking glove distribution of numbness without pain.  He describes sensations that felt like his feet were not touching the ground.  He was on oral vitamin B12 already at that time.

## 2020-02-17 NOTE — H&P (Signed)
History and Physical    Hector [name]  EGB:[id number]  DOB: 1940-12-26  DOA: 02/16/2020  PCP: Pcp, No Patient coming from: home  Chief Complaint: right side weakness  HPI:  Hector [name] is a 79 yo CM with PMH atypical carcinoid tumor,, BPH s/p TURP, HTN, GERD, diverticulitis who initially presented to [facility] on Monday after he had developed right sided weakness over the weekend as well as a change in his speech.  He states that he last felt normal on Saturday and waking up on Sunday morning but shortly after, he noted that his left face felt tingly and he then started to have difficulty with his speech (per daughter, was skipping some words when talking but he did appear to understand conversations as well as answer questions properly, he just occasionally forgot some words, as well as had some slurring of his speech); he also had right-sided weakness to the point of having an unstable gait.   Most of his symptoms resolved later on Sunday but again recurred on Monday and did not improve, thus he sought attention. He underwent imaging studies given the suspicion for stroke. 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 CTA head/neck was also negative for LVO or significant carotid stenosis.  He was again noted to have chronic ischemic microangiopathy and generalized atrophy.  He was transferred to [facility] for MRI brain and further stroke work-up.  Of note, prior office notes dating back to 2017 revealed that the patient had complaints of paresthesias in his extremities and stocking glove distribution of numbness without pain.  He describes sensations that felt like his feet were not touching the ground.  He was on oral vitamin B12 already at that time.   I have personally briefly reviewed patient's old medical records in [facility]  and discussed patient with the ER provider when appropriate/indicated.  Assessment/Plan: * Right sided weakness - acute onset of symptoms mainly on Sunday with recurrence on Monday and not resolving; patient having to lift right arm off bed to help move it; weakness seems to be biggest lingering symptom; still some mild dysarthria but no obvious aphasia nor any paresthesias - CTH shows B/L basal ganglia infarts and left external capsule (age indeterminate but considered remote) - follow up MRI brain - neuro consult if notable for stroke - PT, OT, SLP evals - NPO until swallow screen - continue IVF - check B12 level   Polycythemia - previous notes from 2016 mention patient used to donate blood and take aspirin; no large workup noted since, and now patient presents with bilateral foci and stroke like symptoms; plus long standing "paresthesias" - his Hgb has been consistently >16.5 g/dL and HCT consistently >49 - will check Epo and JAK2 [id number]; depending on results, patient may still need bone marrow biopsy or hematology referral to continue workup   Malignant neoplasm of left lung [facility]) - follows outpatient with [facility] oncology, last seen 01/15/20 - s/p previous laser ablations with residual tumor afterwards. Then treated with bronchoscopy, left VATS lower lobe sleeve lobectomy and MLND on 09/26/2019.   Essential hypertension - given concern for evolving stroke per neuro still, will allow for further permissive htn today, then possibly start reducing Wednesday     Code Status: Full DVT Prophylaxis: Lovenox Anticipated disposition is: rehab  History: Past Medical History:  Diagnosis Date  . Diverticulitis 04/2012  admission [facility]  . Former smoker    Smoked for 20 yrs;  Quit  25 yrs ago.  Marland Kitchen GERD (gastroesophageal reflux disease)   . Hypertension   . Hypokalemia   . Lung cancer ([place])   . 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 Polycythemia   . Prostatitis    S/P   TURP    Past Surgical History:  Procedure Laterality Date  . APPENDECTOMY  02/25/11  . COLONOSCOPY    . LOBECTOMY    . TRANSURETHRAL RESECTION OF PROSTATE  2004     reports that he quit smoking about 31 years ago. He has never used smokeless tobacco. He reports current alcohol use of about 7.0 standard drinks of alcohol per week. He reports that he does not use drugs.  No Known Allergies  Family History  Problem Relation Age of Onset  . Stroke Mother 64  . Heart disease Father 40  . Cancer Paternal Uncle        Carcinoma of unknown origin.  . Colon cancer Neg Hx    Home Medications: Prior to Admission medications   Medication Sig Start Date End Date Taking? Authorizing Provider  acetaminophen (TYLENOL) 500 MG tablet Take 500-1,000 mg by mouth every 6 (six) hours as needed for mild pain or headache.   Yes [provider]  aspirin 81 MG tablet Take 81 mg by mouth at bedtime.    Yes [provider]  atenolol (TENORMIN) 50 MG tablet TAKE ONE (1) TABLET EACH DAY Patient taking differently: Take 50 mg by mouth daily.  02/03/16  Yes Stacks, Cletus Gash, MD  diazepam (VALIUM) 5 MG tablet Take 5 mg by mouth 2 (two) times daily as needed for anxiety.   Yes [provider]  fluticasone (FLONASE) 50 MCG/ACT nasal spray Place 1-2 sprays into both nostrils daily as needed for allergies or rhinitis.   Yes [provider]  losartan (COZAAR) 25 MG tablet Take 25 mg by mouth at bedtime.    Yes [provider]  sucralfate (CARAFATE) 1 g tablet Take 1 g by mouth in the morning and at bedtime.    Yes [provider]  vitamin B-12 (CYANOCOBALAMIN) 1000 MCG tablet Take 1,000 mcg by mouth daily.     Yes [provider]    Review of Systems:  Pertinent items noted in HPI and remainder of comprehensive ROS otherwise negative.  Physical Exam: Vitals:   02/17/20 0700 02/17/20 0800 02/17/20 1007 02/17/20 1126  BP: 131/85 (!) 140/103 (!) 158/81  137/89  Pulse: 64 67 65 68  Resp: 19 (!) _0 Temp:   98.1 F (36.7 C) 97.9 F (36.6 C)  TempSrc:   Oral Oral  SpO2: 95% 97% 98% 96%  Weight:      Height:       General appearance: alert, cooperative and no distress Head: Normocephalic, without obvious abnormality, atraumatic Eyes: EOMI Lungs: clear to auscultation bilaterally Heart: regular rate and rhythm and S1, S2 normal Abdomen: normal findings: bowel sounds normal and soft, non-tender Extremities: no edema Skin: mobility and turgor normal Neurologic: barely 3/5 strength in RUE (lifts against gravity but not resistance) and RLE (slightly better). No dysmetria in LUE (unable to test fully in RUE due to weakness). No paresthesias throughout   Labs on Admission:  I have personally reviewed following labs and imaging studies Results for orders placed or performed during the hospital encounter of 02/16/20 (from the past 24 hour(s))  Hemoglobin A1c  Status: Abnormal   Collection Time: 02/17/20 12:36 PM  Result Value Ref Range   Hgb A1c MFr Bld 6.2 (H) 4.8 - 5.6 %   Mean Plasma Glucose 131.24 mg/dL  Lipid panel     Status: Abnormal   Collection Time: 02/17/20 12:36 PM  Result Value Ref Range   Cholesterol 148 0 - 200 mg/dL   Triglycerides 179 (H) <150 mg/dL   HDL 35 (L) >40 mg/dL   Total CHOL/HDL Ratio 4.2 RATIO   VLDL 36 0 - 40 mg/dL   LDL Cholesterol 77 0 - 99 mg/dL  CBC     Status: None   Collection Time: 02/17/20 12:36 PM  Result Value Ref Range   WBC 7.0 4.0 - 10.5 K/uL   RBC 5.45 4.22 - 5.81 MIL/uL   Hemoglobin 17.0 13.0 - 17.0 g/dL   HCT 50.0 39 - 52 %   MCV 91.7 80.0 - 100.0 fL   MCH 31.2 26.0 - 34.0 pg   MCHC 34.0 30.0 - 36.0 g/dL   RDW 12.3 11.5 - 15.5 %   Platelets 181 150 - 400 K/uL   nRBC 0.0 0.0 - 0.2 %  TSH     Status: None   Collection Time: 02/17/20 12:36 PM  Result Value Ref Range   TSH 1.444 0.350 - 4.500 uIU/mL  Comprehensive metabolic panel     Status: Abnormal   Collection Time:  02/17/20 12:36 PM  Result Value Ref Range   Sodium 138 135 - 145 mmol/L   Potassium 4.1 3.5 - 5.1 mmol/L   Chloride 103 98 - 111 mmol/L   CO2 26 22 - 32 mmol/L   Glucose, Bld 139 (H) 70 - 99 mg/dL   BUN 12 8 - 23 mg/dL   Creatinine, Ser 1.19 0.61 - 1.24 mg/dL   Calcium 9.5 8.9 - 10.3 mg/dL   Total Protein 6.6 6.5 - 8.1 g/dL   Albumin 4.0 3.5 - 5.0 g/dL   AST 22 15 - 41 U/L   ALT 29 0 - 44 U/L   Alkaline Phosphatase 48 38 - 126 U/L   Total Bilirubin 0.9 0.3 - 1.2 mg/dL   GFR, Estimated 58 (L) >60 mL/min   Anion gap 9 5 - 15     Radiological Exams on Admission: CT Angio Head W or Wo Contrast  Result Date: 02/17/2020 CLINICAL DATA:  Stroke-like symptoms. Left facial numbness and dizziness. EXAM: CT ANGIOGRAPHY HEAD AND NECK TECHNIQUE: Multidetector CT imaging of the head and neck was performed using the standard protocol during bolus administration of intravenous contrast. Multiplanar CT image reconstructions and MIPs were obtained to evaluate the vascular anatomy. Carotid stenosis measurements (when applicable) are obtained utilizing NASCET criteria, using the distal internal carotid diameter as the denominator. CONTRAST:  174m OMNIPAQUE IOHEXOL 350 MG/ML SOLN COMPARISON:  None. FINDINGS: CT HEAD FINDINGS Brain: There is no mass, hemorrhage or extra-axial collection. There is generalized atrophy without lobar predilection. There is hypoattenuation of the periventricular white matter, most commonly indicating chronic ischemic microangiopathy. Skull: The visualized skull base, calvarium and extracranial soft tissues are normal. Sinuses/Orbits: No fluid levels or advanced mucosal thickening of the visualized paranasal sinuses. No mastoid or middle ear effusion. The orbits are normal. CTA NECK FINDINGS SKELETON: There is no bony spinal canal stenosis. No lytic or blastic lesion. OTHER NECK: Normal pharynx, larynx and major salivary glands. No cervical lymphadenopathy. Unremarkable thyroid gland.  UPPER CHEST: No pneumothorax or pleural effusion. No nodules or masses. AORTIC ARCH: There is  calcific atherosclerosis of the aortic arch. There is no aneurysm, dissection or hemodynamically significant stenosis of the visualized portion of the aorta. Conventional 3 vessel aortic branching pattern. The visualized proximal subclavian arteries are widely patent. RIGHT CAROTID SYSTEM: Normal without aneurysm, dissection or stenosis. LEFT CAROTID SYSTEM: Normal without aneurysm, dissection or stenosis. VERTEBRAL ARTERIES: Left dominant configuration. Both origins are clearly patent. There is no dissection, occlusion or flow-limiting stenosis to the skull base (V1-V3 segments). CTA HEAD FINDINGS POSTERIOR CIRCULATION: --Vertebral arteries: Normal V4 segments. --Inferior cerebellar arteries: Normal. --Basilar artery: Normal. --Superior cerebellar arteries: Normal. --Posterior cerebral arteries (PCA): Normal. ANTERIOR CIRCULATION: --Intracranial internal carotid arteries: Atherosclerotic calcification of the internal carotid arteries at the skull base without hemodynamically significant stenosis. --Anterior cerebral arteries (ACA): Normal. Both A1 segments are present. Patent anterior communicating artery (a-comm). --Middle cerebral arteries (MCA): Normal. VENOUS SINUSES: As permitted by contrast timing, patent. ANATOMIC VARIANTS: Fetal origin of the right posterior cerebral artery. Review of the MIP images confirms the above findings. IMPRESSION: 1. No emergent large vessel occlusion or hemodynamically significant stenosis of the head or neck. 2. Chronic ischemic microangiopathy and generalized atrophy. Aortic Atherosclerosis (ICD10-I70.0). Electronically Signed   By: Ulyses Jarred M.D.   On: 02/17/2020 03:48   CT Head Wo Contrast  Result Date: 02/16/2020 CLINICAL DATA:  Left facial burning and gait instability, slurred speech noted at 1200 hours EXAM: CT HEAD WITHOUT CONTRAST TECHNIQUE: Contiguous axial images were  obtained from the base of the skull through the vertex without intravenous contrast. COMPARISON:  None. 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 No large CT evident area large vascular territory or cortically based infarction. No evidence of hemorrhage, hydrocephalus, extra-axial collection, visible mass lesion or mass effect. Symmetric prominence of the ventricles, cisterns and sulci compatible with parenchymal volume loss. Patchy areas of white matter hypoattenuation are most compatible with chronic microvascular angiopathy. Vascular: Atherosclerotic calcification of the carotid siphons and intradural vertebral arteries. No hyperdense vessel. Skull: No calvarial fracture or suspicious osseous lesion. No scalp swelling or hematoma. Sinuses/Orbits: Mild thickening in the paranasal sinuses, left greater than right. No air-fluid levels are pneumatized secretions. Mastoid air cells are predominantly clear with pneumatization of the petrous apices. Included orbital structures are unremarkable. Other: None. IMPRESSION: 1. 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 2. No large CT evident area of large vascular territory or cortically based infarction or other acute intracranial abnormality. If there is persisting concern for acute infarction, MRI is more sensitive and specific for early features of ischemia. 3. Parenchymal volume loss and chronic microvascular angiopathy. 4. Intracranial atherosclerosis. Electronically Signed   By: Lovena Le M.D.   On: 02/16/2020 15:33   CT Angio Neck W and/or Wo Contrast  Result Date: 02/17/2020 CLINICAL DATA:  Stroke-like symptoms. Left facial numbness and dizziness. EXAM: CT ANGIOGRAPHY HEAD  AND NECK TECHNIQUE: Multidetector CT imaging of the head and neck was performed using the standard protocol during bolus administration of intravenous contrast. Multiplanar CT image reconstructions and MIPs were obtained to evaluate the vascular anatomy. Carotid stenosis measurements (when applicable) are obtained utilizing NASCET criteria, using the distal internal carotid diameter as the denominator. CONTRAST:  187m OMNIPAQUE IOHEXOL 350 MG/ML SOLN COMPARISON:  None. FINDINGS: CT HEAD FINDINGS Brain: There is no mass, hemorrhage  or extra-axial collection. There is generalized atrophy without lobar predilection. There is hypoattenuation of the periventricular white matter, most commonly indicating chronic ischemic microangiopathy. Skull: The visualized skull base, calvarium and extracranial soft tissues are normal. Sinuses/Orbits: No fluid levels or advanced mucosal thickening of the visualized paranasal sinuses. No mastoid or middle ear effusion. The orbits are normal. CTA NECK FINDINGS SKELETON: There is no bony spinal canal stenosis. No lytic or blastic lesion. OTHER NECK: Normal pharynx, larynx and major salivary glands. No cervical lymphadenopathy. Unremarkable thyroid gland. UPPER CHEST: No pneumothorax or pleural effusion. No nodules or masses. AORTIC ARCH: There is calcific atherosclerosis of the aortic arch. There is no aneurysm, dissection or hemodynamically significant stenosis of the visualized portion of the aorta. Conventional 3 vessel aortic branching pattern. The visualized proximal subclavian arteries are widely patent. RIGHT CAROTID SYSTEM: Normal without aneurysm, dissection or stenosis. LEFT CAROTID SYSTEM: Normal without aneurysm, dissection or stenosis. VERTEBRAL ARTERIES: Left dominant configuration. Both origins are clearly patent. There is no dissection, occlusion or flow-limiting stenosis to the skull base (V1-V3 segments). CTA HEAD FINDINGS POSTERIOR CIRCULATION: --Vertebral arteries:  Normal V4 segments. --Inferior cerebellar arteries: Normal. --Basilar artery: Normal. --Superior cerebellar arteries: Normal. --Posterior cerebral arteries (PCA): Normal. ANTERIOR CIRCULATION: --Intracranial internal carotid arteries: Atherosclerotic calcification of the internal carotid arteries at the skull base without hemodynamically significant stenosis. --Anterior cerebral arteries (ACA): Normal. Both A1 segments are present. Patent anterior communicating artery (a-comm). --Middle cerebral arteries (MCA): Normal. VENOUS SINUSES: As permitted by contrast timing, patent. ANATOMIC VARIANTS: Fetal origin of the right posterior cerebral artery. Review of the MIP images confirms the above findings. IMPRESSION: 1. No emergent large vessel occlusion or hemodynamically significant stenosis of the head or neck. 2. Chronic ischemic microangiopathy and generalized atrophy. Aortic Atherosclerosis (ICD10-I70.0). Electronically Signed   By: Ulyses Jarred M.D.   On: 02/17/2020 03:48   DG Chest Portable 1 View  Result Date: 02/16/2020 CLINICAL DATA:  Dizziness EXAM: PORTABLE CHEST 1 VIEW COMPARISON:  None. FINDINGS: The heart size and mediastinal contours are within normal limits. Both lungs are clear. The visualized skeletal structures are unremarkable. IMPRESSION: No active disease. Electronically Signed   By: Inez Catalina M.D.   On: 02/16/2020 15:45   ECHOCARDIOGRAM COMPLETE  Result Date: 02/17/2020    ECHOCARDIOGRAM REPORT   Patient Name:   Hector [name] Date of Exam: 02/17/2020 Medical Rec #:  [id number]      Height:       71.0 in Accession #:    [id number]     Weight:       200.0 lb Date of Birth:  1940/07/25      BSA:          2.109 m Patient Age:    55 years       BP:           137/89 mmHg Patient Gender: M              HR:           68 bpm. Exam Location:  Inpatient Procedure: 2D Echo Indications:    right sided weakness  History:        Patient has no prior history of Echocardiogram examinations.                  Risk Factors:Former Smoker. Lung cancer.  Sonographer:    Jannett Celestine RDCS (AE) Referring Phys: [place]  Sonographer Comments: limited mobility due to right sided weakness. no true  parasternal window IMPRESSIONS  1. Left ventricular ejection fraction, by estimation, is 60 to 65%. The left ventricle has normal function. The left ventricle has no regional wall motion abnormalities. Left ventricular diastolic parameters were normal.  2. Right ventricular systolic function is normal. The right ventricular size is normal.  3. The mitral valve is normal in structure. No evidence of mitral valve regurgitation. No evidence of mitral stenosis.  4. The aortic valve is normal in structure. Aortic valve regurgitation is not visualized. No aortic stenosis is present.  5. The inferior vena cava is normal in size with greater than 50% respiratory variability, suggesting right atrial pressure of 3 mmHg. FINDINGS  Left Ventricle: Left ventricular ejection fraction, by estimation, is 60 to 65%. The left ventricle has normal function. The left ventricle has no regional wall motion abnormalities. The left ventricular internal cavity size was normal in size. There is  no left ventricular hypertrophy. Left ventricular diastolic parameters were normal. Normal left ventricular filling pressure. Right Ventricle: The right ventricular size is normal. No increase in right ventricular wall thickness. Right ventricular systolic function is normal. Left Atrium: Left atrial size was normal in size. Right Atrium: Right atrial size was normal in size. Pericardium: There is no evidence of pericardial effusion. Mitral Valve: The mitral valve is normal in structure. No evidence of mitral valve regurgitation. No evidence of mitral valve stenosis. Tricuspid Valve: The tricuspid valve is normal in structure. Tricuspid valve regurgitation is not demonstrated. No evidence of tricuspid stenosis. Aortic Valve: The aortic valve is normal in  structure. Aortic valve regurgitation is not visualized. No aortic stenosis is present. Pulmonic Valve: The pulmonic valve was not well visualized. Pulmonic valve regurgitation is not visualized. No evidence of pulmonic stenosis. Aorta: The aortic root is normal in size and structure. Venous: The inferior vena cava is normal in size with greater than 50% respiratory variability, suggesting right atrial pressure of 3 mmHg. IAS/Shunts: No atrial level shunt detected by color flow Doppler.  LEFT VENTRICLE PLAX 2D LVIDd:         3.50 cm  Diastology LVIDs:         2.20 cm  LV e' medial:    6.64 cm/s LV PW:         0.80 cm  LV E/e' medial:  8.6 LV IVS:        0.90 cm  LV e' lateral:   9.46 cm/s LVOT diam:     2.40 cm  LV E/e' lateral: 6.1 LV SV:         76 LV SV Index:   36 LVOT Area:     4.52 cm  RIGHT VENTRICLE RV S prime:     10.60 cm/s TAPSE (M-mode): 1.6 cm LEFT ATRIUM         Index LA diam:    3.90 cm 1.85 cm/m  AORTIC VALVE LVOT Vmax:   82.90 cm/s LVOT Vmean:  63.100 cm/s LVOT VTI:    0.168 m  AORTA Ao Root diam: 3.10 cm MITRAL VALVE MV Area (PHT): 2.97 cm    SHUNTS MV Decel Time: 255 msec    Systemic VTI:  0.17 m MV E velocity: 57.40 cm/s  Systemic Diam: 2.40 cm MV A velocity: 58.30 cm/s MV E/A ratio:  0.98 Mihai Croitoru MD Electronically signed by Sanda Klein MD Signature Date/Time: 02/17/2020/2:45:59 PM    Final    CT Angio Head W or Wo Contrast  Final Result    CT Angio Neck W and/or  Wo Contrast  Final Result    DG Chest Portable 1 View  Final Result    CT Head Wo Contrast  Final Result    MR BRAIN WO CONTRAST    (Results Pending)    Consults called:  Neurology    EKG: Independently reviewed. SR; early R wave progression   Dwyane Dee, MD [facility] 02/17/2020, 3:37 PM

## 2020-02-17 NOTE — Assessment & Plan Note (Addendum)
-   acute onset of symptoms mainly on Sunday with recurrence on Monday and not resolving; patient having to lift right arm off bed to help move it; weakness seems to be biggest lingering symptom; still some mild dysarthria but no obvious aphasia nor any paresthesias - CTH shows B/L basal ganglia infarts and left external capsule (age indeterminate but considered remote) - follow up MRI brain - neuro consult if notable for stroke - PT, OT, SLP evals - NPO until swallow screen - continue IVF - check B12 level

## 2020-02-17 NOTE — ED Provider Notes (Signed)
Pt pending transfer for work up of worsening weakness, speech difficulties.  Pt with worsening symptoms during ED stay.  Plan to transfer ED to ED to obtain MRI to further evaluate sxs.  D/w Dr. Regenia Skeeter, who accepts the patient in transfer.     Quintella Reichert, MD 02/17/20 [phone]

## 2020-02-17 NOTE — Assessment & Plan Note (Addendum)
-   given concern for evolving stroke per neuro still, will allow for further permissive htn today, then possibly start reducing Wednesday

## 2020-02-17 NOTE — ED Notes (Signed)
Dr. Randal Buba informed of speech change from baseline.

## 2020-02-17 NOTE — ED Notes (Signed)
Pt passed swallow screen, however states "does not feel normal"

## 2020-02-17 NOTE — Assessment & Plan Note (Signed)
-  previous notes from 2016 mention patient used to donate blood and take aspirin; no large workup noted since, and now patient presents with bilateral foci and stroke like symptoms; plus long standing "paresthesias" - his Hgb has been consistently >16.5 g/dL and HCT consistently >49 - will check Epo and JAK2 [id number]; depending on results, patient may still need bone marrow biopsy or hematology referral to continue workup

## 2020-02-18 ENCOUNTER — Inpatient Hospital Stay (HOSPITAL_COMMUNITY): Payer: Medicare PPO

## 2020-02-18 DIAGNOSIS — I6330 Cerebral infarction due to thrombosis of unspecified cerebral artery: Secondary | ICD-10-CM | POA: Diagnosis present

## 2020-02-18 DIAGNOSIS — R7303 Prediabetes: Secondary | ICD-10-CM | POA: Diagnosis present

## 2020-02-18 DIAGNOSIS — R531 Weakness: Secondary | ICD-10-CM

## 2020-02-18 DIAGNOSIS — I10 Essential (primary) hypertension: Secondary | ICD-10-CM

## 2020-02-18 DIAGNOSIS — G8191 Hemiplegia, unspecified affecting right dominant side: Secondary | ICD-10-CM

## 2020-02-18 DIAGNOSIS — E785 Hyperlipidemia, unspecified: Secondary | ICD-10-CM | POA: Diagnosis present

## 2020-02-18 DIAGNOSIS — N400 Enlarged prostate without lower urinary tract symptoms: Secondary | ICD-10-CM

## 2020-02-18 LAB — CBC WITH DIFFERENTIAL/PLATELET
Abs Immature Granulocytes: 0.07 10*3/uL (ref 0.00–0.07)
Basophils Absolute: 0.1 10*3/uL (ref 0.0–0.1)
Basophils Relative: 1 %
Eosinophils Absolute: 0.2 10*3/uL (ref 0.0–0.5)
Eosinophils Relative: 3 %
HCT: 47.8 % (ref 39.0–52.0)
Hemoglobin: 16.2 g/dL (ref 13.0–17.0)
Immature Granulocytes: 1 %
Lymphocytes Relative: 19 %
Lymphs Abs: 1.6 10*3/uL (ref 0.7–4.0)
MCH: 31.2 pg (ref 26.0–34.0)
MCHC: 33.9 g/dL (ref 30.0–36.0)
MCV: 91.9 fL (ref 80.0–100.0)
Monocytes Absolute: 0.8 10*3/uL (ref 0.1–1.0)
Monocytes Relative: 9 %
Neutro Abs: 5.7 10*3/uL (ref 1.7–7.7)
Neutrophils Relative %: 67 %
Platelets: 187 10*3/uL (ref 150–400)
RBC: 5.2 MIL/uL (ref 4.22–5.81)
RDW: 12.3 % (ref 11.5–15.5)
WBC: 8.3 10*3/uL (ref 4.0–10.5)
nRBC: 0 % (ref 0.0–0.2)

## 2020-02-18 LAB — BASIC METABOLIC PANEL
Anion gap: 9 (ref 5–15)
BUN: 11 mg/dL (ref 8–23)
CO2: 26 mmol/L (ref 22–32)
Calcium: 9.2 mg/dL (ref 8.9–10.3)
Chloride: 104 mmol/L (ref 98–111)
Creatinine, Ser: 1.07 mg/dL (ref 0.61–1.24)
GFR, Estimated: >60 mL/min (ref >60)
Glucose, Bld: 123 mg/dL — ABNORMAL HIGH (ref 70–99)
Potassium: 3.9 mmol/L (ref 3.5–5.1)
Sodium: 139 mmol/L (ref 135–145)

## 2020-02-18 LAB — URINE CULTURE
Culture: NO GROWTH
Special Requests: NORMAL

## 2020-02-18 LAB — MAGNESIUM: Magnesium: 1.9 mg/dL (ref 1.7–2.4)

## 2020-02-18 MED ORDER — ATORVASTATIN CALCIUM 10 MG PO TABS
20.0000 mg | ORAL_TABLET | Freq: Every day | ORAL | Status: DC
  Administered 2020-02-18 – 2020-02-20 (×3): 20 mg via ORAL
  Filled 2020-02-18 (×3): qty 2

## 2020-02-18 MED ORDER — ASPIRIN EC 81 MG PO TBEC
81.0000 mg | DELAYED_RELEASE_TABLET | Freq: Every day | ORAL | Status: DC
  Administered 2020-02-18 – 2020-02-20 (×3): 81 mg via ORAL
  Filled 2020-02-18 (×3): qty 1

## 2020-02-18 NOTE — Evaluation (Signed)
Speech Language Pathology Evaluation Patient Details Name: Hector [name] MRN: [id number] DOB: [birth year] Today's Date: 02/18/2020 Time: 5277-8242 SLP Time Calculation (min) (ACUTE ONLY): 37 min  Problem List:  Patient Active Problem List   Diagnosis Date Noted  . Cerebral thrombosis with cerebral infarction 02/18/2020  . Right sided weakness 02/17/2020  . Malignant neoplasm of left lung ([place]) 02/17/2020  . CVA (cerebral vascular accident) ([place]) 02/16/2020  . Well adult 02/01/2016  . Hyperlipemia 07/27/2015  . Insomnia 07/27/2015  . Gastroesophageal reflux disease without esophagitis 07/27/2015  . Paresthesia of both lower extremities 01/26/2015  . Paresthesia of both hands 01/26/2015  . Essential hypertension 01/26/2015  . Personal history of colonic adenoma 08/08/2012  . Polycythemia 11/23/2011   Past Medical History:  Past Medical History:  Diagnosis Date  . Diverticulitis 04/2012   admission [facility]  . Former smoker    Smoked for 20 yrs;  Quit  25 yrs ago.  Marland Kitchen GERD (gastroesophageal reflux disease)   . Hypertension   . Hypokalemia   . Lung cancer ([place])   . 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 Polycythemia   . Prostatitis    S/P  TURP   Past Surgical History:  Past Surgical History:  Procedure Laterality Date  . APPENDECTOMY  02/25/11  . COLONOSCOPY    . LOBECTOMY    . TRANSURETHRAL RESECTION OF PROSTATE  2004   HPI:  79 yo male adm to [facility] with right sided weakness, also has polycythemia, lung cancer hx s/p VATS and lobectomy, colonic adenoma, insomnia, HTN.  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  Speech eval ordered.   Pt reports frequently having cognitive screen with his PCP during well visit but denies some items on the test being the same.    Assessment / Plan / Recommendation Clinical Impression  SLUMS exam administered to pt with him scoring 28/30 which indicates normal  cognitive function.  He was able to recall 5 words without delay and only missed single detailed information x1 from paragraph.  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    Glossopharyngeal, trigeminal, and potential vagus nerve involvement due to inadequate palatal elevation/deviation left.  Lingual discoordination/weakness noted due to hypoglossal nerve deficit. Decreased labial closure with plosive sounds due to facial nerve deficit present.  Pt is intelligible however and has most challenges with multisyllabic words and consonant clusters.  SLP advised him to compensate for his dysarthria by slowing rate and ovearticulating.  He demonstrated improvement in speech intelligiblity with said strategies.  Advised pt and daughter to findings of exam and compensations for dysarthria.   He was noted to become tearful during exam x3 and stated he felt he needed a "mental exam" due to his sadness.     In addition, pt coughing with water x2 during session concerning for aspiration.  He advised to increased "strangling" - SLP advises to proceed with MBS due to concern for aspiration and new CVA, hx lung cancer.    SLP Assessment  SLP Recommendation/Assessment: Patient needs continued Speech Lanaguage Pathology Services SLP Visit Diagnosis: Dysarthria and anarthria (R47.1)    Follow Up Recommendations  Inpatient Rehab    Frequency and Duration min 1 x/week  2 weeks      SLP Evaluation Cognition  Overall Cognitive Status: Within Functional Limits for tasks assessed Arousal/Alertness: Awake/alert Orientation Level: Oriented X4;Oriented to person;Oriented to place;Oriented to time Memory:  Appears intact (recalled 5/5 words independently) Awareness: Appears intact Problem Solving: Appears intact Behaviors:  (pt became tearful during session several times) Safety/Judgment: Appears intact       Comprehension  Auditory Comprehension Overall Auditory  Comprehension: Appears within functional limits for tasks assessed Yes/No Questions: Not tested Commands: Within Functional Limits (multistep) Conversation: Complex Visual Recognition/Discrimination Discrimination: Not tested Reading Comprehension Reading Status: Not tested    Expression Expression Primary Mode of Expression: Verbal Verbal Expression Overall Verbal Expression: Appears within functional limits for tasks assessed Initiation: No impairment Level of Generative/Spontaneous Verbalization: Conversation Repetition: No impairment Naming: Not tested Pragmatics: No impairment Interfering Components: Speech intelligibility Written Expression Dominant Hand: Right Written Expression: Not tested (pt with right hemiparesis)   Oral / Motor  Oral Motor/Sensory Function Overall Oral Motor/Sensory Function: Moderate impairment Facial ROM: Within Functional Limits Facial Symmetry: Within Functional Limits Facial Strength: Reduced right;Other (Comment) (decreased sustained labial closure with plosive sounds) Facial Sensation: Within Functional Limits Lingual ROM: Reduced right Lingual Symmetry: Within Functional Limits Lingual Strength: Reduced Velum: Impaired right (deviates left) Mandible:  (dnt) Motor Speech Overall Motor Speech: Impaired Respiration: Within functional limits (pt and daughter report voice is at baseline) Phonation: Low vocal intensity (pt reports is at baseline) Articulation: Impaired Level of Impairment: Word (most notably consonant clusters) Intelligibility: Intelligibility reduced Word: 75-100% accurate Phrase: 75-100% accurate Sentence: 50-74% accurate Conversation: 50-74% accurate Motor Planning: Witnin functional limits Effective Techniques: Slow rate;Over-articulate   GO                    Macario Golds 02/18/2020, 9:49 AM  Kathleen Lime, MS [facility] SLP Acute Rehab Services Office [phone] Pager [phone]

## 2020-02-18 NOTE — Progress Notes (Signed)
Inpatient Rehab Admissions Coordinator:   Met with patient at bedside to discuss potential CIR admission. Pt. Stated interest. Will pursue for potential admit pending insurance auth and bed availability.   Jyll Tomaro, MS, CCC-SLP Rehab Admissions Coordinator  [phone] (celll) [phone] (office)  

## 2020-02-18 NOTE — TOC Initial Note (Signed)
Transition of Care [facility]) - Initial/Assessment Note    Patient Details  Name: Hector [name] MRN: [id number] Date of Birth: Oct 30, 1940  [facility] CM/SW Contact:    Hector Collet, RN Phone Number: 02/18/2020, 11:00 AM  Clinical Narrative:       Hector [name] w patient and daughter at bedside.  Hector [name] [phone] [address] [place] [address] Patient resides in [place] with wife, visits [facility] once every three months and during that time stays with daughter. +CVA, independent PTA. No previous DME needs, PT OT evals pending. TOC will continue to follow.     PCP Elnita Maxwell in [place]            Expected Discharge Plan: [place] Barriers to Discharge: Continued Medical Work up   Patient Goals and CMS Choice        Expected Discharge Plan and Services Expected Discharge Plan: [place]                                              Prior Living Arrangements/Services                       Activities of Daily Living Home Assistive Devices/Equipment: None ADL Screening (condition at time of admission) Patient's cognitive ability adequate to safely complete daily activities?: Yes Is the patient deaf or have difficulty hearing?: No Does the patient have difficulty seeing, even when wearing glasses/contacts?: No Does the patient have difficulty concentrating, remembering, or making decisions?: No Patient able to express need for assistance with ADLs?: Yes Does the patient have difficulty dressing or bathing?: No Independently performs ADLs?: No Communication: Needs assistance Is this a change from baseline?: Change from baseline, expected to last >3 days Dressing (OT): Needs assistance Is this a change from baseline?: Change from baseline, expected to last <3days Grooming: Needs assistance Is this a change from baseline?: Change from baseline, expected to last <3 days Feeding:  Independent Bathing: Needs assistance Is this a change from baseline?: Change from baseline, expected to last <3 days Toileting: Needs assistance Is this a change from baseline?: Change from baseline, expected to last <3 days In/Out Bed: Needs assistance Is this a change from baseline?: Change from baseline, expected to last <3 days Walks in Home: Needs assistance Is this a change from baseline?: Change from baseline, expected to last >3 days Does the patient have difficulty walking or climbing stairs?: Yes Weakness of Legs: Both Weakness of Arms/Hands: Right  Permission Sought/Granted                  Emotional Assessment              Admission diagnosis:  TIA (transient ischemic attack) [G45.9] CVA (cerebral vascular accident) ([place]) [I63.9] Right sided weakness [R53.1] Patient Active Problem List   Diagnosis Date Noted  . Cerebral thrombosis with cerebral infarction 02/18/2020  . Right sided weakness 02/17/2020  . Malignant neoplasm of left lung ([place]) 02/17/2020  . CVA (cerebral vascular accident) ([place]) 02/16/2020  . Well adult 02/01/2016  . Hyperlipemia 07/27/2015  . Insomnia 07/27/2015  . Gastroesophageal reflux disease without esophagitis 07/27/2015  . Paresthesia of both lower extremities 01/26/2015  . Paresthesia of both hands 01/26/2015  . Essential hypertension 01/26/2015  . Personal history of colonic adenoma 08/08/2012  .  Polycythemia 11/23/2011   PCP:  Pcp, No Pharmacy:   [place], [place] [address] Phone: [phone] Fax: [phone]     Social Determinants of Health (SDOH) Interventions    Readmission Risk Interventions No flowsheet data found.

## 2020-02-18 NOTE — Progress Notes (Signed)
STROKE TEAM PROGRESS NOTE   INTERVAL HISTORY His daughter Cecille Rubin is at the bedside.  He has had recurrent episodes beginning Sunday on L sided facial burning, restless and unsteady gait. Recurred Monday dizzy when standing w/ speech slurred. Resolved. But nurse daughter took to [employer] where she works.   Patient is from [place], [place], here with his wife visiting daughter after his wife had surgery while she recovered. This happened during their stay in [place]. Daughter in [place] can help provide post-rehab care.  Patient is states his right upper extremity strength remains quite poor and has not improved since yesterday the right lower extremity may improve slightly.  MRI confirmed left pontine lacunar stroke.  Vitals:   02/17/20 2026 02/17/20 2317 02/18/20 0413 02/18/20 0746  BP: (!) 147/76 (!) 159/81 (!) 163/83 (!) 176/85  Pulse: 79 73 66 73  Resp: 17 18 18 14  Temp: 99.2 F (37.3 C) 98.9 F (37.2 C) 99.5 F (37.5 C) 98.2 F (36.8 C)  TempSrc: Oral Oral Oral Oral  SpO2: 96% 96% 97% 96%  Weight:      Height:       CBC:  Recent Labs  Lab 02/16/20 1458 02/16/20 1458 02/17/20 1236 02/18/20 0124  WBC 7.8   < > 7.0 8.3  NEUTROABS 4.9  --   --  5.7  HGB 17.5*   < > 17.0 16.2  HCT 51.6   < > 50.0 47.8  MCV 93.5   < > 91.7 91.9  PLT 217   < > 181 187   < > = values in this interval not displayed.   Basic Metabolic Panel:  Recent Labs  Lab 02/17/20 1236 02/18/20 0124  NA 138 139  K 4.1 3.9  CL 103 104  CO2 26 26  GLUCOSE 139* 123*  BUN 12 11  CREATININE 1.19 1.07  CALCIUM 9.5 9.2  MG  --  1.9   Lipid Panel:  Recent Labs  Lab 02/17/20 1236  CHOL 148  TRIG 179*  HDL 35*  CHOLHDL 4.2  VLDL 36  LDLCALC 77   HgbA1c:  Recent Labs  Lab 02/17/20 1236  HGBA1C 6.2*   Urine Drug Screen: No results for input(s): LABOPIA, COCAINSCRNUR, LABBENZ, AMPHETMU, THCU, LABBARB in the last 168 hours.  Alcohol Level No results for input(s): ETH in the last 168  hours.  IMAGING past 24 hours MR BRAIN WO CONTRAST  Result Date: 02/17/2020 CLINICAL DATA:  Right-sided weakness EXAM: MRI HEAD WITHOUT CONTRAST TECHNIQUE: Multiplanar, multiecho pulse sequences of the brain and surrounding structures were obtained without intravenous contrast. COMPARISON:  None. 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 No acute hemorrhage. Multifocal hyperintense T2-weighted signal within the white matter. There is generalized atrophy without lobar predilection. No chronic microhemorrhage. Normal midline structures. Vascular: Normal flow voids. Skull and upper cervical spine: Normal marrow signal. Sinuses/Orbits: Negative. Other: None. 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 No hemorrhage or mass effect. Electronically Signed   By: Kevin  Herman M.D.   On: 02/17/2020 19:08   ECHOCARDIOGRAM COMPLETE  Result Date: 02/17/2020    ECHOCARDIOGRAM REPORT   Patient Name:   Iyan C Siwek Date of Exam: 02/17/2020 Medical Rec #:  [id number]      Height:       71.0 in Accession #:    [id number]     Weight:       20 0.0 lb Date of Birth:  January 02, 1941  BSA:          2.109 m Patient Age:    67 years       BP:           137/89 mmHg Patient Gender: M              HR:           68 bpm. Exam Location:  Inpatient Procedure: 2D Echo Indications:    right sided weakness  History:        Patient has no prior history of Echocardiogram examinations.                 Risk Factors:Former Smoker. Lung cancer.  Sonographer:    Jannett Celestine RDCS (AE) Referring Phys: [place]  Sonographer Comments: limited mobility due to right sided weakness. no true parasternal window IMPRESSIONS  1. Left ventricular ejection fraction, by estimation, is 60 to 65%. The left ventricle has normal function. The left ventricle has no regional wall motion abnormalities. Left ventricular diastolic parameters were normal.  2. Right ventricular systolic function is normal.  The right ventricular size is normal.  3. The mitral valve is normal in structure. No evidence of mitral valve regurgitation. No evidence of mitral stenosis.  4. The aortic valve is normal in structure. Aortic valve regurgitation is not visualized. No aortic stenosis is present.  5. The inferior vena cava is normal in size with greater than 50% respiratory variability, suggesting right atrial pressure of 3 mmHg. FINDINGS  Left Ventricle: Left ventricular ejection fraction, by estimation, is 60 to 65%. The left ventricle has normal function. The left ventricle has no regional wall motion abnormalities. The left ventricular internal cavity size was normal in size. There is  no left ventricular hypertrophy. Left ventricular diastolic parameters were normal. Normal left ventricular filling pressure. Right Ventricle: The right ventricular size is normal. No increase in right ventricular wall thickness. Right ventricular systolic function is normal. Left Atrium: Left atrial size was normal in size. Right Atrium: Right atrial size was normal in size. Pericardium: There is no evidence of pericardial effusion. Mitral Valve: The mitral valve is normal in structure. No evidence of mitral valve regurgitation. No evidence of mitral valve stenosis. Tricuspid Valve: The tricuspid valve is normal in structure. Tricuspid valve regurgitation is not demonstrated. No evidence of tricuspid stenosis. Aortic Valve: The aortic valve is normal in structure. Aortic valve regurgitation is not visualized. No aortic stenosis is present. Pulmonic Valve: The pulmonic valve was not well visualized. Pulmonic valve regurgitation is not visualized. No evidence of pulmonic stenosis. Aorta: The aortic root is normal in size and structure. Venous: The inferior vena cava is normal in size with greater than 50% respiratory variability, suggesting right atrial pressure of 3 mmHg. IAS/Shunts: No atrial level shunt detected by color flow Doppler.  LEFT  VENTRICLE PLAX 2D LVIDd:         3.50 cm  Diastology LVIDs:         2.20 cm  LV e' medial:    6.64 cm/s LV PW:         0.80 cm  LV E/e' medial:  8.6 LV IVS:        0.90 cm  LV e' lateral:   9.46 cm/s LVOT diam:     2.40 cm  LV E/e' lateral: 6.1 LV SV:         76 LV SV Index:   36 LVOT Area:     4.52 cm  RIGHT VENTRICLE RV S prime:     10.60 cm/s TAPSE (M-mode): 1.6 cm LEFT ATRIUM         Index LA diam:    3.90 cm 1.85 cm/m  AORTIC VALVE LVOT Vmax:   82.90 cm/s LVOT Vmean:  63.100 cm/s LVOT VTI:    0.168 m  AORTA Ao Root diam: 3.10 cm MITRAL VALVE MV Area (PHT): 2.97 cm    SHUNTS MV Decel Time: 255 msec    Systemic VTI:  0.17 m MV E velocity: 57.40 cm/s  Systemic Diam: 2.40 cm MV A velocity: 58.30 cm/s MV E/A ratio:  0.98 Mihai Croitoru MD Electronically signed by Sanda Klein MD Signature Date/Time: 02/17/2020/2:45:59 PM    Final     PHYSICAL EXAM Pleasant elderly Caucasian male not in distress. . Afebrile. Head is nontraumatic. Neck is supple without bruit.    Cardiac exam no murmur or gallop. Lungs are clear to auscultation. Distal pulses are well felt. Neurological Exam :   he is awake alert oriented to time place and person.  Follows commands well.  Mild dysarthria.  Moderate right lower facial weakness.  Tongue midline.  Extraocular movements are full range without nystagmus.  Blinks to threat bilaterally.  Motor system exam shows both right upper and slight right lower extremity drift.  Right upper extremity strength is 2/5 and right lower extremities 4/5.  Coordination impaired on the right.  Sensation preserved bilaterally.  Gait not tested. ASSESSMENT/PLAN [name] is a 79 y.o. male with history of lung caner, polycythemia, BPH s/p TURP, HTN, GERD, diverticulitis, former smoker presenting with recurrent episodes of R sided weakness and abnormal speech.   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 No large abnormality. No acute abnormality. Small vessel disease. Atrophy.     CTA head & neck no LVO. Small vessel disease. Atrophy.   MRI  Small L paramedian pontine infarct   2D Echo EF 60-65%. No source of embolus   LDL 77  HgbA1c 6.2  VTE prophylaxis - Lovenox 40 mg sq daily   aspirin 81 mg daily prior to admission, now on aspirin 325 mg daily and clopidogrel 75 mg daily following plavix load. Continue DAPT x 3 weeks then plavix alone.   Therapy recommendations:  SLP, anticipate need for CIR   Disposition:  pending   Hypertension  Stable . Permissive hypertension (OK if < 220/120) but gradually normalize in 5-7 days . Long-term BP goal normotensive  Hyperlipidemia  Home meds:  No statin  Add lipitor 20    LDL 77, goal < 70  Continue statin at discharge  Other Stroke Risk Factors  Advanced age  Former Cigarette smoker, quit 31 yrs ago  ETOH use, advised to drink no more than 7 drink(s) a day  Overweight, Body mass index is 27.89 kg/m., recommend weight loss, diet and exercise as appropriate   Family hx stroke (mother)  Other Active Problems  Hx lung cancer  Colonic adenoma   Polycythemia  Prostatitis s/p TURP  Hospital day # 1  He presented with stuttering symptoms of dizziness gait ataxia facial weakness and numbness followed by right hemiparesis secondary to left pontine lacunar infarct from small vessel disease.  Recommend cautious mobilization and therapy consults.  Aspirin Plavix for 3 weeks followed by Plavix alone.  Continue ongoing stroke work-up.  Long discussion with patient and daughter at  the bedside and answered questions.  Discussed with Dr.Pahwani and Kaliqdina  Greater than 50% time during this 35-minute visit was spent on counseling and coordination of care and answering questions. Antony Contras, MD To contact Stroke Continuity provider, please refer to [email or web address] After hours, contact General  Neurology

## 2020-02-18 NOTE — Evaluation (Signed)
Physical Therapy Evaluation Patient Details Name: Hector [name] MRN: [id number] DOB: 06/02/1940 Today's Date: 02/18/2020   History of Present Illness  79yo male prsenting with R sided weakness and speech slurring/word finding difficulty. 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 No tpa given. PMH lung CA, HTN, lobectomy  Clinical Impression   Patient received in bed, daughter present and assisted in session. Demonstrates significant R sided weakness in UE/LE, able to somewhat activate with R LE but minimal activation noted R UE today; too weak to attempt RAM testing. Needed heavy levels of physical assistance to get to EOB today, initially required MinA to maintain balance then faded to close min guard statically, very unsteady with seated dynamic tasks however. Able to perform partial stand from elevated bed with totalA but with poor coordination patterns and muscle coordination. Will require +2 to safely progress mobility. Left positioned to comfort in bed with all needs met, bed alarm active and daughter present. Would really benefit from intensive therapies in CIR setting moving forward.     Follow Up Recommendations CIR    Equipment Recommendations  Other (comment) (defer to next venue)    Recommendations for Other Services       Precautions / Restrictions Precautions Precautions: Fall;Other (comment) Precaution Comments: R sided weakness, chronic peripheral neuropathy Restrictions Weight Bearing Restrictions: No      Mobility  Bed Mobility Overal bed mobility: Needs Assistance Bed Mobility: Supine to Sit;Sit to Supine     Supine to sit: HOB elevated;Total assist Sit to supine: Max assist   General bed mobility comments: able to get BLEs off EOB but then needed totalA to bring trunk up and scoot hips around to EOB; with return to bed, able to manage trunk but needed MaxA for  BLEs  Transfers Overall transfer level: Needs assistance Equipment used: Rolling [place] (2 wheeled) Transfers: Sit to/from Stand Sit to Stand: Total assist;From elevated surface         General transfer comment: able to perform partial stand from elevated surface, but unable to fully extend hips into standing position; minimal coordination/activation R LE  Ambulation/Gait             General Gait Details: unable  Stairs            Wheelchair Mobility    Modified Rankin (Stroke Patients Only)       Balance Overall balance assessment: Needs assistance Sitting-balance support: Single extremity supported;Feet supported Sitting balance-Leahy Scale: Poor Sitting balance - Comments: close min guard statically, Min-ModA dynamically Postural control: Posterior lean Standing balance support: During functional activity Standing balance-Leahy Scale: Zero                               Pertinent Vitals/Pain Pain Assessment: Faces Faces Pain Scale: No hurt Pain Intervention(s): Limited activity within patient's tolerance;Monitored during session    Home Living Family/patient expects to be discharged to:: Private residence Living Arrangements: Spouse/significant other;Children (wife just had hysterectomy) Available Help at Discharge: Family;Available PRN/intermittently (but daughter can be available 24/7 if needed) Type of Home: House Home Access: Stairs to enter Entrance Stairs-Rails: None Entrance Stairs-Number of Steps: 2-3 STE no rails Home Layout: One level Home Equipment: None Additional Comments: patient/spouse life in [place] but are staying with daughter as wife recovers from surgery, OK to stay with daughter as he recovers too  Prior Function Level of Independence: Independent         Comments: still some mild dysarthria     Hand Dominance   Dominant Hand: Right    Extremity/Trunk Assessment   Upper Extremity Assessment Upper  Extremity Assessment: Defer to OT evaluation (no activation at shoulder, minimal activation at biceps, trace gross activation at hand in PT eval)    Lower Extremity Assessment Lower Extremity Assessment: RLE deficits/detail;LLE deficits/detail RLE Deficits / Details: ankle dorsiflexion 2+ to 3-/5, hip flexor 3/5, quad 2+ to 3-/5 RLE Sensation: history of peripheral neuropathy;decreased proprioception RLE Coordination: decreased gross motor LLE Deficits / Details: WNL LLE Sensation: history of peripheral neuropathy;WNL LLE Coordination: WNL    Cervical / Trunk Assessment Cervical / Trunk Assessment: Normal  Communication   Communication: Expressive difficulties  Cognition Arousal/Alertness: Awake/alert Behavior During Therapy: WFL for tasks assessed/performed Overall Cognitive Status: Within Functional Limits for tasks assessed                                        General Comments      Exercises     Assessment/Plan    PT Assessment Patient needs continued PT services  PT Problem List Decreased strength;Decreased knowledge of use of DME;Decreased activity tolerance;Decreased safety awareness;Decreased balance;Decreased mobility;Decreased coordination;Impaired sensation       PT Treatment Interventions DME instruction;Balance training;Gait training;Stair training;Functional mobility training;Patient/family education;Therapeutic activities;Therapeutic exercise;Neuromuscular re-education    PT Goals (Current goals can be found in the Care Plan section)  Acute Rehab PT Goals Patient Stated Goal: to go to rehab PT Goal Formulation: With patient/family Time For Goal Achievement: 03/03/20 Potential to Achieve Goals: Good    Frequency Min 4X/week   Barriers to discharge        Co-evaluation               AM-PAC PT "6 Clicks" Mobility  Outcome Measure Help needed turning from your back to your side while in a flat bed without using bedrails?: A  Lot Help needed moving from lying on your back to sitting on the side of a flat bed without using bedrails?: Total Help needed moving to and from a bed to a chair (including a wheelchair)?: Total Help needed standing up from a chair using your arms (e.g., wheelchair or bedside chair)?: Total Help needed to walk in hospital room?: Total Help needed climbing 3-5 steps with a railing? : Total 6 Click Score: 7    End of Session Equipment Utilized During Treatment: Gait belt Activity Tolerance: Patient tolerated treatment well Patient left: in bed;with call bell/phone within reach;with bed alarm set Nurse Communication: Mobility status;Need for lift equipment PT Visit Diagnosis: Unsteadiness on feet (R26.81);Difficulty in walking, not elsewhere classified (R26.2);Hemiplegia and hemiparesis;Other abnormalities of gait and mobility (R26.89) Hemiplegia - Right/Left: Right Hemiplegia - dominant/non-dominant: Dominant Hemiplegia - caused by: Cerebral infarction    Time: 1132-1218 PT Time Calculation (min) (ACUTE ONLY): 46 min   Charges:   PT Evaluation $PT Eval Moderate Complexity: 1 Mod PT Treatments $Therapeutic Activity: 8-22 mins $Self Care/Home Management: 8-22        Windell Norfolk, DPT, PN1   Supplemental Physical Therapist [place]    Pager [phone] Acute Rehab Office [phone]

## 2020-02-18 NOTE — Progress Notes (Signed)
PROGRESS NOTE    Hector [name]  NAT:[id number] DOB: 1940/05/14 DOA: 02/16/2020 PCP: Pcp, No   Brief Narrative:  Hector [name] is a 79 yo CM with PMH atypical carcinoid tumor,, BPH s/p TURP, HTN, GERD, diverticulitis who initially presented to [facility] on Monday after he had developed right sided weakness over the weekend as well as a change in his speech.  He states that he last felt normal on Saturday and waking up on Sunday morning but shortly after, he noted that his left face felt tingly and he then started to have difficulty with his speech (per daughter, was skipping some words when talking but he did appear to understand conversations as well as answer questions properly, he just occasionally forgot some words, as well as had some slurring of his speech); he also had right-sided weakness to the point of having an unstable gait.   Most of his symptoms resolved later on Sunday but again recurred on Monday and did not improve, thus he sought attention. He underwent imaging studies given the suspicion for stroke. 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 CTA head/neck was also negative for LVO or significant carotid stenosis.  He was again noted to have chronic ischemic microangiopathy and generalized atrophy.  He was transferred to [facility] for MRI brain and further stroke work-up.  Of note, prior office notes dating back to 2017 revealed that the patient had complaints of paresthesias in his extremities and stocking glove distribution of numbness without pain.  He describes sensations that felt like his feet were not touching the ground.  He was on oral vitamin B12 already at that time.  Assessment & Plan:   Principal Problem:   Right sided weakness Active Problems:   Polycythemia   Essential hypertension   Malignant neoplasm of  left lung (HCC)   Cerebral thrombosis with cerebral infarction   Acute CVA/right-sided weakness: CTH shows B/L basal ganglia infarts and left external capsule (age indeterminate but considered remote).  MRI brain shows small acute infarct of the left paramedian pons.  No hemorrhage.  CTA head and neck rule out any LVO.  Patient is still with right-sided weakness.  No other complaint.  Neurology on board.  He remains on DAPT.  Passes swallow.  PT OT to assess.  Will likely need CIR.  Essential hypertension: Allow for permissive hypertension for the first 24-48h - only treat PRN if SBP >427 mmHg or diastolic blood pressure >062. Blood pressures can be gradually normalized to SBP<140 over 5 to 7 days.  Holding home losartan.  Hyperlipidemia: Triglyceride 179.  LDL 77.  On low-dose atorvastatin 20 mg per neurology.  Malignant neoplasm of left lung: Follows oncology outpatient at [facility].  Last seen 01/15/2020. s/p previous laser ablations with residual tumor afterwards. Then treated with bronchoscopy, left VATS lower lobe sleeve lobectomy and MLND on 09/26/2019.  DVT prophylaxis: enoxaparin (LOVENOX) injection 40 mg Start: 02/17/20 2200   Code Status: Full Code  Family Communication: Daughter present at bedside.  Plan of care discussed with patient and his daughter in length and he verbalized understanding and agreed with it.  Status is: Inpatient  Remains inpatient appropriate because:Inpatient level of care appropriate due to severity of illness   Dispo: The patient is from: Home              Anticipated d/c is to: CIR  Anticipated d/c date is: 1 day              Patient currently is not medically stable to d/c.        Estimated body mass index is 27.89 kg/m as calculated from the following:   Height as of this encounter: 5\' 11"  (1.803 m).   Weight as of this encounter: 90.7 kg.      Nutritional status:               Consultants:   Neurology  Procedures:     None  Antimicrobials:  Anti-infectives (From admission, onward)   None         Subjective: Patient seen and examined.  Daughter at the bedside.  Patient from [facility]  He has no complaint.  Still weak on the right side.  Objective: Vitals:   02/17/20 2026 02/17/20 2317 02/18/20 0413 02/18/20 0746  BP: (!) 147/76 (!) 159/81 (!) 163/83 (!) 176/85  Pulse: 79 73 66 73  Resp: 17 18 18 14   Temp: 99.2 F (37.3 C) 98.9 F (37.2 C) 99.5 F (37.5 C) 98.2 F (36.8 C)  TempSrc: Oral Oral Oral Oral  SpO2: 96% 96% 97% 96%  Weight:      Height:        Intake/Output Summary (Last 24 hours) at 02/18/2020 1103 Last data filed at 02/18/2020 0830 Gross per 24 hour  Intake 1355.97 ml  Output 200 ml  Net 1155.97 ml   Filed Weights   02/16/20 1450  Weight: 90.7 kg    Examination:  General exam: Appears calm and comfortable  Respiratory system: Clear to auscultation. Respiratory effort normal. Cardiovascular system: S1 & S2 heard, RRR. No JVD, murmurs, rubs, gallops or clicks. No pedal edema. Gastrointestinal system: Abdomen is nondistended, soft and nontender. No organomegaly or masses felt. Normal bowel sounds heard. Central nervous system: Alert and oriented.  2/5 power in right upper and lower extremity. Skin: No rashes, lesions or ulcers Psychiatry: Judgement and insight appear normal. Mood & affect appropriate.    Data Reviewed: I have personally reviewed following labs and imaging studies  CBC: Recent Labs  Lab 02/16/20 1458 02/17/20 1236 02/18/20 0124  WBC 7.8 7.0 8.3  NEUTROABS 4.9  --  5.7  HGB 17.5* 17.0 16.2  HCT 51.6 50.0 47.8  MCV 93.5 91.7 91.9  PLT 217 181 010   Basic Metabolic Panel: Recent Labs  Lab 02/16/20 1458 02/17/20 1236 02/18/20 0124  NA 137 138 139  K 3.7 4.1 3.9  CL 101 103 104  CO2 27 26 26   GLUCOSE 152* 139* 123*  BUN 15 12 11   CREATININE 1.20 1.19 1.07  CALCIUM 9.6 9.5 9.2  MG  --   --  1.9   GFR: Estimated  Creatinine Clearance: 64.5 mL/min (by C-G formula based on SCr of 1.07 mg/dL). Liver Function Tests: Recent Labs  Lab 02/16/20 1458 02/17/20 1236  AST 23 22  ALT 26 29  ALKPHOS 55 48  BILITOT 0.6 0.9  PROT 7.2 6.6  ALBUMIN 4.3 4.0   No results for input(s): LIPASE, AMYLASE in the last 168 hours. No results for input(s): AMMONIA in the last 168 hours. Coagulation Profile: No results for input(s): INR, PROTIME in the last 168 hours. Cardiac Enzymes: No results for input(s): CKTOTAL, CKMB, CKMBINDEX, TROPONINI in the last 168 hours. BNP (last 3 results) No results for input(s): PROBNP in the last 8760 hours. HbA1C: Recent Labs    02/17/20 1236  HGBA1C 6.2*   CBG: No results for input(s): GLUCAP in the last 168 hours. Lipid Profile: Recent Labs    02/17/20 1236  CHOL 148  HDL 35*  LDLCALC 77  TRIG 179*  CHOLHDL 4.2   Thyroid Function Tests: Recent Labs    02/17/20 1236  TSH 1.444   Anemia Panel: Recent Labs    02/17/20 2040  VITAMINB12 514   Sepsis Labs: No results for input(s): PROCALCITON, LATICACIDVEN in the last 168 hours.  Recent Results (from the past 240 hour(s))  Respiratory Panel by RT PCR (Flu A&B, Covid) - Nasopharyngeal Swab     Status: None   Collection Time: 02/16/20  3:29 PM   Specimen: Nasopharyngeal Swab  Result Value Ref Range Status   SARS Coronavirus 2 by RT PCR NEGATIVE NEGATIVE Final    Comment: (NOTE) SARS-CoV-2 target nucleic acids are NOT DETECTED.  The SARS-CoV-2 RNA is generally detectable in upper respiratoy specimens during the acute phase of infection. The lowest concentration of SARS-CoV-2 viral copies this assay can detect is 131 copies/mL. A negative result does not preclude SARS-Cov-2 infection and should not be used as the sole basis for treatment or other patient management decisions. A negative result may occur with  improper specimen collection/handling, submission of specimen other than nasopharyngeal swab,  presence of viral mutation(s) within the areas targeted by this assay, and inadequate number of viral copies (<131 copies/mL). A negative result must be combined with clinical observations, patient history, and epidemiological information. The expected result is Negative.  Fact Sheet for Patients:  PinkCheek.be  Fact Sheet for Healthcare Providers:  GravelBags.it  This test is no t yet approved or cleared by the [place] FDA and  has been authorized for detection and/or diagnosis of SARS-CoV-2 by FDA under an Emergency Use Authorization (EUA). This EUA will remain  in effect (meaning this test can be used) for the duration of the COVID-19 declaration under Section 564(b)(1) of the Act, 21 U.S.C. section 360bbb-3(b)(1), unless the authorization is terminated or revoked sooner.     Influenza A by PCR NEGATIVE NEGATIVE Final   Influenza B by PCR NEGATIVE NEGATIVE Final    Comment: (NOTE) The Xpert Xpress SARS-CoV-2/FLU/RSV assay is intended as an aid in  the diagnosis of influenza from Nasopharyngeal swab specimens and  should not be used as a sole basis for treatment. Nasal washings and  aspirates are unacceptable for Xpert Xpress SARS-CoV-2/FLU/RSV  testing.  Fact Sheet for Patients: PinkCheek.be  Fact Sheet for Healthcare Providers: GravelBags.it  This test is not yet approved or cleared by the [place] FDA and  has been authorized for detection and/or diagnosis of SARS-CoV-2 by  FDA under an Emergency Use Authorization (EUA). This EUA will remain  in effect (meaning this test can be used) for the duration of the  Covid-19 declaration under Section 564(b)(1) of the Act, 21  U.S.C. section 360bbb-3(b)(1), unless the authorization is  terminated or revoked. Performed at [facility], [address] [place], [place] [address]   Urine  culture     Status: None   Collection Time: 02/16/20  3:29 PM   Specimen: Urine, Clean Catch  Result Value Ref Range Status   Specimen Description   Final    URINE, CLEAN CATCH Performed at [facility], [address] [place], [place] [address]    Special Requests   Final    Normal Performed at [facility], [address],  [place], [place] [address]    Culture   Final    NO GROWTH Performed at [facility], [place] [address], [place], [place] [address]    Report Status 02/18/2020 FINAL  Final      Radiology Studies: CT Angio Head W or Wo Contrast  Result Date: 02/17/2020 CLINICAL DATA:  Stroke-like symptoms. Left facial numbness and dizziness. EXAM: CT ANGIOGRAPHY HEAD AND NECK TECHNIQUE: Multidetector CT imaging of the head and neck was performed using the standard protocol during bolus administration of intravenous contrast. Multiplanar CT image reconstructions and MIPs were obtained to evaluate the vascular anatomy. Carotid stenosis measurements (when applicable) are obtained utilizing NASCET criteria, using the distal internal carotid diameter as the denominator. CONTRAST:  171mL OMNIPAQUE IOHEXOL 350 MG/ML SOLN COMPARISON:  None. FINDINGS: CT HEAD FINDINGS Brain: There is no mass, hemorrhage or extra-axial collection. There is generalized atrophy without lobar predilection. There is hypoattenuation of the periventricular white matter, most commonly indicating chronic ischemic microangiopathy. Skull: The visualized skull base, calvarium and extracranial soft tissues are normal. Sinuses/Orbits: No fluid levels or advanced mucosal thickening of the visualized paranasal sinuses. No mastoid or middle ear effusion. The orbits are normal. CTA NECK FINDINGS SKELETON: There is no bony spinal canal stenosis. No lytic or blastic lesion. OTHER NECK: Normal pharynx, larynx and major salivary glands. No cervical lymphadenopathy. Unremarkable thyroid gland. UPPER CHEST: No  pneumothorax or pleural effusion. No nodules or masses. AORTIC ARCH: There is calcific atherosclerosis of the aortic arch. There is no aneurysm, dissection or hemodynamically significant stenosis of the visualized portion of the aorta. Conventional 3 vessel aortic branching pattern. The visualized proximal subclavian arteries are widely patent. RIGHT CAROTID SYSTEM: Normal without aneurysm, dissection or stenosis. LEFT CAROTID SYSTEM: Normal without aneurysm, dissection or stenosis. VERTEBRAL ARTERIES: Left dominant configuration. Both origins are clearly patent. There is no dissection, occlusion or flow-limiting stenosis to the skull base (V1-V3 segments). CTA HEAD FINDINGS POSTERIOR CIRCULATION: --Vertebral arteries: Normal V4 segments. --Inferior cerebellar arteries: Normal. --Basilar artery: Normal. --Superior cerebellar arteries: Normal. --Posterior cerebral arteries (PCA): Normal. ANTERIOR CIRCULATION: --Intracranial internal carotid arteries: Atherosclerotic calcification of the internal carotid arteries at the skull base without hemodynamically significant stenosis. --Anterior cerebral arteries (ACA): Normal. Both A1 segments are present. Patent anterior communicating artery (a-comm). --Middle cerebral arteries (MCA): Normal. VENOUS SINUSES: As permitted by contrast timing, patent. ANATOMIC VARIANTS: Fetal origin of the right posterior cerebral artery. Review of the MIP images confirms the above findings. IMPRESSION: 1. No emergent large vessel occlusion or hemodynamically significant stenosis of the head or neck. 2. Chronic ischemic microangiopathy and generalized atrophy. Aortic Atherosclerosis (ICD10-I70.0). Electronically Signed   By: Ulyses Jarred M.D.   On: 02/17/2020 03:48   CT Head Wo Contrast  Result Date: 02/16/2020 CLINICAL DATA:  Left facial burning and gait instability, slurred speech noted at 1200 hours EXAM: CT HEAD WITHOUT CONTRAST TECHNIQUE: Contiguous axial images were obtained from  the base of the skull through the vertex without intravenous contrast. COMPARISON:  None. 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 No large CT evident area large vascular territory or cortically based infarction. No evidence of hemorrhage, hydrocephalus, extra-axial collection, visible mass lesion or mass effect. Symmetric prominence of the ventricles, cisterns and sulci compatible with parenchymal volume loss. Patchy areas of white matter hypoattenuation are most compatible with chronic microvascular angiopathy. Vascular: Atherosclerotic calcification of the  carotid siphons and intradural vertebral arteries. No hyperdense vessel. Skull: No calvarial fracture or suspicious osseous lesion. No scalp swelling or hematoma. Sinuses/Orbits: Mild thickening in the paranasal sinuses, left greater than right. No air-fluid levels are pneumatized secretions. Mastoid air cells are predominantly clear with pneumatization of the petrous apices. Included orbital structures are unremarkable. Other: None. IMPRESSION: 1. 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 2. No large CT evident area of large vascular territory or cortically based infarction or other acute intracranial abnormality. If there is persisting concern for acute infarction, MRI is more sensitive and specific for early features of ischemia. 3. Parenchymal volume loss and chronic microvascular angiopathy. 4. Intracranial atherosclerosis. Electronically Signed   By: Lovena Le M.D.   On: 02/16/2020 15:33   CT Angio Neck W and/or Wo Contrast  Result Date: 02/17/2020 CLINICAL DATA:  Stroke-like symptoms. Left facial numbness and dizziness. EXAM: CT ANGIOGRAPHY HEAD AND NECK  TECHNIQUE: Multidetector CT imaging of the head and neck was performed using the standard protocol during bolus administration of intravenous contrast. Multiplanar CT image reconstructions and MIPs were obtained to evaluate the vascular anatomy. Carotid stenosis measurements (when applicable) are obtained utilizing NASCET criteria, using the distal internal carotid diameter as the denominator. CONTRAST:  185mL OMNIPAQUE IOHEXOL 350 MG/ML SOLN COMPARISON:  None. FINDINGS: CT HEAD FINDINGS Brain: There is no mass, hemorrhage or extra-axial collection. There is generalized atrophy without lobar predilection. There is hypoattenuation of the periventricular white matter, most commonly indicating chronic ischemic microangiopathy. Skull: The visualized skull base, calvarium and extracranial soft tissues are normal. Sinuses/Orbits: No fluid levels or advanced mucosal thickening of the visualized paranasal sinuses. No mastoid or middle ear effusion. The orbits are normal. CTA NECK FINDINGS SKELETON: There is no bony spinal canal stenosis. No lytic or blastic lesion. OTHER NECK: Normal pharynx, larynx and major salivary glands. No cervical lymphadenopathy. Unremarkable thyroid gland. UPPER CHEST: No pneumothorax or pleural effusion. No nodules or masses. AORTIC ARCH: There is calcific atherosclerosis of the aortic arch. There is no aneurysm, dissection or hemodynamically significant stenosis of the visualized portion of the aorta. Conventional 3 vessel aortic branching pattern. The visualized proximal subclavian arteries are widely patent. RIGHT CAROTID SYSTEM: Normal without aneurysm, dissection or stenosis. LEFT CAROTID SYSTEM: Normal without aneurysm, dissection or stenosis. VERTEBRAL ARTERIES: Left dominant configuration. Both origins are clearly patent. There is no dissection, occlusion or flow-limiting stenosis to the skull base (V1-V3 segments). CTA HEAD FINDINGS POSTERIOR CIRCULATION: --Vertebral arteries: Normal V4  segments. --Inferior cerebellar arteries: Normal. --Basilar artery: Normal. --Superior cerebellar arteries: Normal. --Posterior cerebral arteries (PCA): Normal. ANTERIOR CIRCULATION: --Intracranial internal carotid arteries: Atherosclerotic calcification of the internal carotid arteries at the skull base without hemodynamically significant stenosis. --Anterior cerebral arteries (ACA): Normal. Both A1 segments are present. Patent anterior communicating artery (a-comm). --Middle cerebral arteries (MCA): Normal. VENOUS SINUSES: As permitted by contrast timing, patent. ANATOMIC VARIANTS: Fetal origin of the right posterior cerebral artery. Review of the MIP images confirms the above findings. IMPRESSION: 1. No emergent large vessel occlusion or hemodynamically significant stenosis of the head or neck. 2. Chronic ischemic microangiopathy and generalized atrophy. Aortic Atherosclerosis (ICD10-I70.0). Electronically Signed   By: Ulyses Jarred M.D.   On: 02/17/2020 03:48   MR BRAIN WO CONTRAST  Result Date: 02/17/2020 CLINICAL DATA:  Right-sided weakness EXAM: MRI HEAD WITHOUT CONTRAST TECHNIQUE: Multiplanar, multiecho pulse sequences of the brain and surrounding  structures were obtained without intravenous contrast. COMPARISON:  None. 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 No acute hemorrhage. Multifocal hyperintense T2-weighted signal within the white matter. There is generalized atrophy without lobar predilection. No chronic microhemorrhage. Normal midline structures. Vascular: Normal flow voids. Skull and upper cervical spine: Normal marrow signal. Sinuses/Orbits: Negative. Other: None. 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 No hemorrhage or mass effect. Electronically Signed   By: Ulyses Jarred M.D.   On: 02/17/2020 19:08   DG Chest Portable 1 View  Result Date: 02/16/2020 CLINICAL DATA:  Dizziness EXAM: PORTABLE CHEST 1 VIEW COMPARISON:  None.  FINDINGS: The heart size and mediastinal contours are within normal limits. Both lungs are clear. The visualized skeletal structures are unremarkable. IMPRESSION: No active disease. Electronically Signed   By: Inez Catalina M.D.   On: 02/16/2020 15:45   ECHOCARDIOGRAM COMPLETE  Result Date: 02/17/2020    ECHOCARDIOGRAM REPORT   Patient Name:   KAIRE STARY Date of Exam: 02/17/2020 Medical Rec #:  [id number]      Height:       71.0 in Accession #:    [id number]     Weight:       200.0 lb Date of Birth:  1940/07/29      BSA:          2.109 m Patient Age:    57 years       BP:           137/89 mmHg Patient Gender: M              HR:           68 bpm. Exam Location:  Inpatient Procedure: 2D Echo Indications:    right sided weakness  History:        Patient has no prior history of Echocardiogram examinations.                 Risk Factors:Former Smoker. Lung cancer.  Sonographer:    Jannett Celestine RDCS (AE) Referring Phys: Otho  Sonographer Comments: limited mobility due to right sided weakness. no true parasternal window IMPRESSIONS  1. Left ventricular ejection fraction, by estimation, is 60 to 65%. The left ventricle has normal function. The left ventricle has no regional wall motion abnormalities. Left ventricular diastolic parameters were normal.  2. Right ventricular systolic function is normal. The right ventricular size is normal.  3. The mitral valve is normal in structure. No evidence of mitral valve regurgitation. No evidence of mitral stenosis.  4. The aortic valve is normal in structure. Aortic valve regurgitation is not visualized. No aortic stenosis is present.  5. The inferior vena cava is normal in size with greater than 50% respiratory variability, suggesting right atrial pressure of 3 mmHg. FINDINGS  Left Ventricle: Left ventricular ejection fraction, by estimation, is 60 to 65%. The left ventricle has normal function. The left ventricle has no regional wall motion abnormalities. The  left ventricular internal cavity size was normal in size. There is  no left ventricular hypertrophy. Left ventricular diastolic parameters were normal. Normal left ventricular filling pressure. Right Ventricle: The right ventricular size is normal. No increase in right ventricular wall thickness. Right ventricular systolic function is normal. Left Atrium: Left atrial size was normal in size. Right Atrium: Right atrial size was normal in size. Pericardium: There is no evidence of pericardial effusion. Mitral Valve: The mitral valve is normal in structure. No  evidence of mitral valve regurgitation. No evidence of mitral valve stenosis. Tricuspid Valve: The tricuspid valve is normal in structure. Tricuspid valve regurgitation is not demonstrated. No evidence of tricuspid stenosis. Aortic Valve: The aortic valve is normal in structure. Aortic valve regurgitation is not visualized. No aortic stenosis is present. Pulmonic Valve: The pulmonic valve was not well visualized. Pulmonic valve regurgitation is not visualized. No evidence of pulmonic stenosis. Aorta: The aortic root is normal in size and structure. Venous: The inferior vena cava is normal in size with greater than 50% respiratory variability, suggesting right atrial pressure of 3 mmHg. IAS/Shunts: No atrial level shunt detected by color flow Doppler.  LEFT VENTRICLE PLAX 2D LVIDd:         3.50 cm  Diastology LVIDs:         2.20 cm  LV e' medial:    6.64 cm/s LV PW:         0.80 cm  LV E/e' medial:  8.6 LV IVS:        0.90 cm  LV e' lateral:   9.46 cm/s LVOT diam:     2.40 cm  LV E/e' lateral: 6.1 LV SV:         76 LV SV Index:   36 LVOT Area:     4.52 cm  RIGHT VENTRICLE RV S prime:     10.60 cm/s TAPSE (M-mode): 1.6 cm LEFT ATRIUM         Index LA diam:    3.90 cm 1.85 cm/m  AORTIC VALVE LVOT Vmax:   82.90 cm/s LVOT Vmean:  63.100 cm/s LVOT VTI:    0.168 m  AORTA Ao Root diam: 3.10 cm MITRAL VALVE MV Area (PHT): 2.97 cm    SHUNTS MV Decel Time: 255 msec     Systemic VTI:  0.17 m MV E velocity: 57.40 cm/s  Systemic Diam: 2.40 cm MV A velocity: 58.30 cm/s MV E/A ratio:  0.98 Mihai Croitoru MD Electronically signed by Sanda Klein MD Signature Date/Time: 02/17/2020/2:45:59 PM    Final     Scheduled Meds: . aspirin EC  81 mg Oral Daily  . atorvastatin  20 mg Oral Daily  . clopidogrel  75 mg Oral Daily  . enoxaparin (LOVENOX) injection  40 mg Subcutaneous Q24H   Continuous Infusions: . sodium chloride 100 mL/hr at 02/18/20 0646     LOS: 1 day   Time spent: 40 minutes   Darliss Cheney, MD [facility]  02/18/2020, 11:03 AM   To contact the attending provider between 7A-7P or the covering provider during after hours 7P-7A, please log into the web site [email or web address].

## 2020-02-18 NOTE — Progress Notes (Signed)
   02/18/20 1348  Clinical Encounter Type  Visited With Patient  Visit Type Initial  Referral From Nurse  Consult/Referral To Chaplain  Spiritual Encounters  Spiritual Needs Emotional  Stress Factors  Patient Stress Factors Health changes;Loss of control   Chaplain responded to consult request. Chaplain provided emotional support in light of major health changes.  Chaplain stayed until called to an emergent need. Pt declined follow-up, but chaplain remains available as needed.   This note was prepared by Chaplain Resident, Dante Gang, MDiv. For questions, please contact by phone at [phone].

## 2020-02-18 NOTE — Consult Note (Signed)
Physical Medicine and Rehabilitation Consult Reason for Consult: Right side weakness Referring Physician: [facility]   HPI: Hector [name] is a 79 y.o. right-handed male with history of hypertension, atypical carcinoid tumor, BPH with TURP, remote smoker.  History taken from chart review and patient.  Patient lives with spouse in [place] and has been staying in the area with daughter after wife's recent hysterectomy.  Independent prior to admission.  1 level home 2-3 steps to entry.  He presented on 02/16/2020 with right hemiparesis and dysarthria.  Cranial CT scan showed few scattered hypoattenuating foci in the basal ganglia and left external capsule.  CT angiogram of head and neck no emergent large vessel occlusion.  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  No hemorrhage or mass-effect.  Echocardiogram with ejection fraction of 60-65%, no wall motion abnormalities.  Hospital course further complicated by prediabetes.  Patient with associated dysarthria.  Currently maintained on aspirin and Plavix for CVA prophylaxis x3 weeks and aspirin alone.  Subcutaneous Lovenox for DVT prophylaxis.  Tolerating regular diet.  Therapy evaluations completed with recommendations of physical medicine rehab consult.  Review of Systems  Constitutional: Negative for chills and fever.  HENT: Negative for hearing loss.   Eyes: Negative for blurred vision and double vision.  Respiratory: Negative for cough and shortness of breath.   Cardiovascular: Negative for chest pain, palpitations and leg swelling.  Gastrointestinal: Positive for constipation. Negative for heartburn, nausea and vomiting.       GERD  Genitourinary: Positive for urgency. Negative for dysuria and hematuria.  Musculoskeletal: Positive for myalgias.  Skin: Negative for rash.  Neurological: Positive for sensory change, speech change, focal weakness and weakness.  All other systems reviewed and are negative.  Past Medical  History:  Diagnosis Date  . Diverticulitis 04/2012   admission [facility]  . Former smoker    Smoked for 20 yrs;  Quit  25 yrs ago.  Marland Kitchen GERD (gastroesophageal reflux disease)   . Hypertension   . Hypokalemia   . Lung cancer ([place])   . 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 Polycythemia   . Prostatitis    S/P  TURP   Past Surgical History:  Procedure Laterality Date  . APPENDECTOMY  02/25/11  . COLONOSCOPY    . LOBECTOMY    . TRANSURETHRAL RESECTION OF PROSTATE  2004   Family History  Problem Relation Age of Onset  . Stroke Mother 79  . Heart disease Father 31  . Cancer Paternal Uncle        Carcinoma of unknown origin.  . Colon cancer Neg Hx    Social History:  reports that he quit smoking about 31 years ago. He has never used smokeless tobacco. He reports current alcohol use of about 7.0 standard drinks of alcohol per week. He reports that he does not use drugs. Allergies: No Known Allergies Medications Prior to Admission  Medication Sig Dispense Refill  . acetaminophen (TYLENOL) 500 MG tablet Take 500-1,000 mg by mouth every 6 (six) hours as needed for mild pain or headache.    Marland Kitchen aspirin 81 MG tablet Take 81 mg by mouth at bedtime.     Marland Kitchen atenolol (TENORMIN) 50 MG tablet TAKE ONE (1) TABLET EACH DAY (Patient taking differently: Take 50 mg by mouth daily. ) 90 tablet 1  . diazepam (VALIUM) 5 MG tablet Take 5 mg by mouth 2 (two) times daily as needed for anxiety.    Marland Kitchen  fluticasone (FLONASE) 50 MCG/ACT nasal spray Place 1-2 sprays into both nostrils daily as needed for allergies or rhinitis.    Marland Kitchen losartan (COZAAR) 25 MG tablet Take 25 mg by mouth at bedtime.     . sucralfate (CARAFATE) 1 g tablet Take 1 g by mouth in the morning and at bedtime.     . vitamin B-12 (CYANOCOBALAMIN) 1000 MCG tablet Take 1,000 mcg by mouth daily.        Home: Home Living Family/patient expects to be discharged to:: Private residence Living Arrangements: Spouse/significant other,  Children (wife just had hysterectomy) Available Help at Discharge: Family, Available PRN/intermittently (but daughter can be available 24/7 if needed) Type of Home: House Home Access: Stairs to enter [employer] of Steps: 2-3 STE no rails Entrance Stairs-Rails: None Home Layout: One level Bathroom Shower/Tub: Walk-in shower, Other (comment) (walkin is very small shower with no space for seat; has other bathroom with more space but would have to step over tub) Bathroom Toilet: Standard Bathroom Accessibility: Yes Home Equipment: None Additional Comments: patient/spouse life in [place] but are staying with daughter as wife recovers from surgery, OK to stay with daughter as he recovers too  Lives With: Spouse  Functional History: Prior Function Level of Independence: Independent Comments: still some mild dysarthria Functional Status:  Mobility: Bed Mobility Overal bed mobility: Needs Assistance Bed Mobility: Supine to Sit, Sit to Supine Supine to sit: HOB elevated, Total assist Sit to supine: Max assist General bed mobility comments: able to get BLEs off EOB but then needed totalA to bring trunk up and scoot hips around to EOB; with return to bed, able to manage trunk but needed MaxA for BLEs Transfers Overall transfer level: Needs assistance Equipment used: Rolling [place] (2 wheeled) Transfers: Sit to/from Stand Sit to Stand: Total assist, From elevated surface General transfer comment: able to perform partial stand from elevated surface, but unable to fully extend hips into standing position; minimal coordination/activation R LE Ambulation/Gait General Gait Details: unable    ADL:    Cognition: Cognition Overall Cognitive Status: Within Functional Limits for tasks assessed Arousal/Alertness: Awake/alert Orientation Level: Oriented X4 Memory: Appears intact (recalled 5/5 words independently) Awareness: Appears intact Problem Solving: Appears intact Behaviors:   (pt became tearful during session several times) Safety/Judgment: Appears intact Cognition Arousal/Alertness: Awake/alert Behavior During Therapy: WFL for tasks assessed/performed Overall Cognitive Status: Within Functional Limits for tasks assessed  Blood pressure (!) 148/85, pulse 70, temperature 98.5 F (36.9 C), temperature source Oral, resp. rate 16, height 5\' 11"  (1.803 m), weight 90.7 kg, SpO2 97 %. Physical Exam Vitals reviewed.  Constitutional:      General: He is not in acute distress.    Appearance: Normal appearance.  HENT:     Head: Normocephalic and atraumatic.     Right Ear: External ear normal.     Left Ear: External ear normal.     Nose: Nose normal.  Eyes:     General:        Right eye: No discharge.        Left eye: No discharge.     Extraocular Movements: Extraocular movements intact.  Cardiovascular:     Rate and Rhythm: Normal rate and regular rhythm.  Pulmonary:     Effort: Pulmonary effort is normal. No respiratory distress.     Breath sounds: Normal breath sounds. No stridor.  Abdominal:     General: Abdomen is flat. Bowel sounds are normal. There is no distension.  Musculoskeletal:  Cervical back: Normal range of motion and neck supple.     Comments: No edema or tenderness in extremities  Skin:    General: Skin is warm and dry.  Neurological:     Mental Status: He is alert.     Comments: Alert and oriented x3 Dysarthria Follows commands. Motor: RUE: Shoulder abduction, elbow flexion/extension 2+/5, wrist extension, handgrip 1/5 RLE: Hip flexion, knee extension 2/5, ankle dorsiflexion 3/5 LUE/LLE: 5/5 proximal to distal Sensation diminished to light touch bilateral lower extremities  Psychiatric:        Mood and Affect: Mood normal.        Behavior: Behavior normal.     Results for orders placed or performed during the hospital encounter of 02/16/20 (from the past 24 hour(s))  Vitamin B12     Status: None   Collection Time: 02/17/20   8:40 PM  Result Value Ref Range   Vitamin B-12 514 180 - 914 pg/mL  Basic metabolic panel     Status: Abnormal   Collection Time: 02/18/20  1:24 AM  Result Value Ref Range   Sodium 139 135 - 145 mmol/L   Potassium 3.9 3.5 - 5.1 mmol/L   Chloride 104 98 - 111 mmol/L   CO2 26 22 - 32 mmol/L   Glucose, Bld 123 (H) 70 - 99 mg/dL   BUN 11 8 - 23 mg/dL   Creatinine, Ser 1.07 0.61 - 1.24 mg/dL   Calcium 9.2 8.9 - 10.3 mg/dL   GFR, Estimated >60 >60 mL/min   Anion gap 9 5 - 15  CBC with Differential/Platelet     Status: None   Collection Time: 02/18/20  1:24 AM  Result Value Ref Range   WBC 8.3 4.0 - 10.5 K/uL   RBC 5.20 4.22 - 5.81 MIL/uL   Hemoglobin 16.2 13.0 - 17.0 g/dL   HCT 47.8 39 - 52 %   MCV 91.9 80.0 - 100.0 fL   MCH 31.2 26.0 - 34.0 pg   MCHC 33.9 30.0 - 36.0 g/dL   RDW 12.3 11.5 - 15.5 %   Platelets 187 150 - 400 K/uL   nRBC 0.0 0.0 - 0.2 %   Neutrophils Relative % 67 %   Neutro Abs 5.7 1.7 - 7.7 K/uL   Lymphocytes Relative 19 %   Lymphs Abs 1.6 0.7 - 4.0 K/uL   Monocytes Relative 9 %   Monocytes Absolute 0.8 0.1 - 1.0 K/uL   Eosinophils Relative 3 %   Eosinophils Absolute 0.2 0.0 - 0.5 K/uL   Basophils Relative 1 %   Basophils Absolute 0.1 0.0 - 0.1 K/uL   Immature Granulocytes 1 %   Abs Immature Granulocytes 0.07 0.00 - 0.07 K/uL  Magnesium     Status: None   Collection Time: 02/18/20  1:24 AM  Result Value Ref Range   Magnesium 1.9 1.7 - 2.4 mg/dL   CT Angio Head W or Wo Contrast  Result Date: 02/17/2020 CLINICAL DATA:  Stroke-like symptoms. Left facial numbness and dizziness. EXAM: CT ANGIOGRAPHY HEAD AND NECK TECHNIQUE: Multidetector CT imaging of the head and neck was performed using the standard protocol during bolus administration of intravenous contrast. Multiplanar CT image reconstructions and MIPs were obtained to evaluate the vascular anatomy. Carotid stenosis measurements (when applicable) are obtained utilizing NASCET criteria, using the distal  internal carotid diameter as the denominator. CONTRAST:  153mL OMNIPAQUE IOHEXOL 350 MG/ML SOLN COMPARISON:  None. FINDINGS: CT HEAD FINDINGS Brain: There is no mass, hemorrhage or extra-axial collection.  There is generalized atrophy without lobar predilection. There is hypoattenuation of the periventricular white matter, most commonly indicating chronic ischemic microangiopathy. Skull: The visualized skull base, calvarium and extracranial soft tissues are normal. Sinuses/Orbits: No fluid levels or advanced mucosal thickening of the visualized paranasal sinuses. No mastoid or middle ear effusion. The orbits are normal. CTA NECK FINDINGS SKELETON: There is no bony spinal canal stenosis. No lytic or blastic lesion. OTHER NECK: Normal pharynx, larynx and major salivary glands. No cervical lymphadenopathy. Unremarkable thyroid gland. UPPER CHEST: No pneumothorax or pleural effusion. No nodules or masses. AORTIC ARCH: There is calcific atherosclerosis of the aortic arch. There is no aneurysm, dissection or hemodynamically significant stenosis of the visualized portion of the aorta. Conventional 3 vessel aortic branching pattern. The visualized proximal subclavian arteries are widely patent. RIGHT CAROTID SYSTEM: Normal without aneurysm, dissection or stenosis. LEFT CAROTID SYSTEM: Normal without aneurysm, dissection or stenosis. VERTEBRAL ARTERIES: Left dominant configuration. Both origins are clearly patent. There is no dissection, occlusion or flow-limiting stenosis to the skull base (V1-V3 segments). CTA HEAD FINDINGS POSTERIOR CIRCULATION: --Vertebral arteries: Normal V4 segments. --Inferior cerebellar arteries: Normal. --Basilar artery: Normal. --Superior cerebellar arteries: Normal. --Posterior cerebral arteries (PCA): Normal. ANTERIOR CIRCULATION: --Intracranial internal carotid arteries: Atherosclerotic calcification of the internal carotid arteries at the skull base without hemodynamically significant stenosis.  --Anterior cerebral arteries (ACA): Normal. Both A1 segments are present. Patent anterior communicating artery (a-comm). --Middle cerebral arteries (MCA): Normal. VENOUS SINUSES: As permitted by contrast timing, patent. ANATOMIC VARIANTS: Fetal origin of the right posterior cerebral artery. Review of the MIP images confirms the above findings. IMPRESSION: 1. No emergent large vessel occlusion or hemodynamically significant stenosis of the head or neck. 2. Chronic ischemic microangiopathy and generalized atrophy. Aortic Atherosclerosis (ICD10-I70.0). Electronically Signed   By: Ulyses Jarred M.D.   On: 02/17/2020 03:48   CT Head Wo Contrast  Result Date: 02/16/2020 CLINICAL DATA:  Left facial burning and gait instability, slurred speech noted at 1200 hours EXAM: CT HEAD WITHOUT CONTRAST TECHNIQUE: Contiguous axial images were obtained from the base of the skull through the vertex without intravenous contrast. COMPARISON:  None. 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 No large CT evident area large vascular territory or cortically based infarction. No evidence of hemorrhage, hydrocephalus, extra-axial collection, visible mass lesion or mass effect. Symmetric prominence of the ventricles, cisterns and sulci compatible with parenchymal volume loss. Patchy areas of white matter hypoattenuation are most compatible with chronic microvascular angiopathy. Vascular: Atherosclerotic calcification of the carotid siphons and intradural vertebral arteries. No hyperdense vessel. Skull: No calvarial fracture or suspicious osseous lesion. No scalp swelling or hematoma. Sinuses/Orbits: Mild thickening in the paranasal sinuses, left greater than right. No air-fluid levels are pneumatized secretions. Mastoid air cells are predominantly clear with pneumatization of the  petrous apices. Included orbital structures are unremarkable. Other: None. IMPRESSION: 1. 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 2. No large CT evident area of large vascular territory or cortically based infarction or other acute intracranial abnormality. If there is persisting concern for acute infarction, MRI is more sensitive and specific for early features of ischemia. 3. Parenchymal volume loss and chronic microvascular angiopathy. 4. Intracranial atherosclerosis. Electronically Signed   By: Lovena Le M.D.   On: 02/16/2020 15:33  CT Angio Neck W and/or Wo Contrast  Result Date: 02/17/2020 CLINICAL DATA:  Stroke-like symptoms. Left facial numbness and dizziness. EXAM: CT ANGIOGRAPHY HEAD AND NECK TECHNIQUE: Multidetector CT imaging of the head and neck was performed using the standard protocol during bolus administration of intravenous contrast. Multiplanar CT image reconstructions and MIPs were obtained to evaluate the vascular anatomy. Carotid stenosis measurements (when applicable) are obtained utilizing NASCET criteria, using the distal internal carotid diameter as the denominator. CONTRAST:  149mL OMNIPAQUE IOHEXOL 350 MG/ML SOLN COMPARISON:  None. FINDINGS: CT HEAD FINDINGS Brain: There is no mass, hemorrhage or extra-axial collection. There is generalized atrophy without lobar predilection. There is hypoattenuation of the periventricular white matter, most commonly indicating chronic ischemic microangiopathy. Skull: The visualized skull base, calvarium and extracranial soft tissues are normal. Sinuses/Orbits: No fluid levels or advanced mucosal thickening of the visualized paranasal sinuses. No mastoid or middle ear effusion. The orbits are normal. CTA NECK FINDINGS SKELETON: There is no bony spinal canal stenosis. No lytic or blastic lesion. OTHER NECK: Normal  pharynx, larynx and major salivary glands. No cervical lymphadenopathy. Unremarkable thyroid gland. UPPER CHEST: No pneumothorax or pleural effusion. No nodules or masses. AORTIC ARCH: There is calcific atherosclerosis of the aortic arch. There is no aneurysm, dissection or hemodynamically significant stenosis of the visualized portion of the aorta. Conventional 3 vessel aortic branching pattern. The visualized proximal subclavian arteries are widely patent. RIGHT CAROTID SYSTEM: Normal without aneurysm, dissection or stenosis. LEFT CAROTID SYSTEM: Normal without aneurysm, dissection or stenosis. VERTEBRAL ARTERIES: Left dominant configuration. Both origins are clearly patent. There is no dissection, occlusion or flow-limiting stenosis to the skull base (V1-V3 segments). CTA HEAD FINDINGS POSTERIOR CIRCULATION: --Vertebral arteries: Normal V4 segments. --Inferior cerebellar arteries: Normal. --Basilar artery: Normal. --Superior cerebellar arteries: Normal. --Posterior cerebral arteries (PCA): Normal. ANTERIOR CIRCULATION: --Intracranial internal carotid arteries: Atherosclerotic calcification of the internal carotid arteries at the skull base without hemodynamically significant stenosis. --Anterior cerebral arteries (ACA): Normal. Both A1 segments are present. Patent anterior communicating artery (a-comm). --Middle cerebral arteries (MCA): Normal. VENOUS SINUSES: As permitted by contrast timing, patent. ANATOMIC VARIANTS: Fetal origin of the right posterior cerebral artery. Review of the MIP images confirms the above findings. IMPRESSION: 1. No emergent large vessel occlusion or hemodynamically significant stenosis of the head or neck. 2. Chronic ischemic microangiopathy and generalized atrophy. Aortic Atherosclerosis (ICD10-I70.0). Electronically Signed   By: Ulyses Jarred M.D.   On: 02/17/2020 03:48   MR BRAIN WO CONTRAST  Result Date: 02/17/2020 CLINICAL DATA:  Right-sided weakness EXAM: MRI HEAD WITHOUT  CONTRAST TECHNIQUE: Multiplanar, multiecho pulse sequences of the brain and surrounding structures were obtained without intravenous contrast. COMPARISON:  None. 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 No acute hemorrhage. Multifocal hyperintense T2-weighted signal within the white matter. There is generalized atrophy without lobar predilection. No chronic microhemorrhage. Normal midline structures. Vascular: Normal flow voids. Skull and upper cervical spine: Normal marrow signal. Sinuses/Orbits: Negative. Other: None. 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 No hemorrhage or mass effect. Electronically Signed   By: Ulyses Jarred M.D.   On: 02/17/2020 19:08   DG Chest Portable 1 View  Result Date: 02/16/2020 CLINICAL DATA:  Dizziness EXAM: PORTABLE CHEST 1 VIEW COMPARISON:  None. FINDINGS: The heart size and mediastinal contours are within normal limits. Both lungs are clear. The visualized skeletal structures are unremarkable. IMPRESSION: No active disease. Electronically Signed   By: Inez Catalina M.D.   On:  02/16/2020 15:45   DG Swallowing Func-Speech Pathology  Result Date: 02/18/2020 Objective Swallowing Evaluation: Type of Study: MBS-Modified Barium Swallow Study  Patient Details Name: DELANEY SCHNICK MRN: [id number] Date of Birth: 1940/09/06 Today's Date: 02/18/2020 Time: SLP Start Time (ACUTE ONLY): 1106 -SLP Stop Time (ACUTE ONLY): 1113 SLP Time Calculation (min) (ACUTE ONLY): 7 min Past Medical History: Past Medical History: Diagnosis Date . Diverticulitis 04/2012  admission [facility] . Former smoker   Smoked for 20 yrs;  Quit  25 yrs ago. Marland Kitchen GERD (gastroesophageal reflux disease)  . Hypertension  . Hypokalemia  . Lung cancer ([place])  . Personal history of colonic adenoma 04/2009 . Polycythemia  . Prostatitis   S/P  TURP Past Surgical History: Past Surgical History: Procedure Laterality Date . APPENDECTOMY  02/25/11 . COLONOSCOPY   .  LOBECTOMY   . TRANSURETHRAL RESECTION OF PROSTATE  2004 HPI: 79 yo male adm to [facility] with right sided weakness, also has polycythemia, lung cancer hx s/p VATS and lobectomy, colonic adenoma, insomnia, HTN.  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  Speech eval ordered.  Subjective: Pt awake, alert, pleasant, participative.  Daughter present for evaluation Assessment / Plan / Recommendation CHL IP CLINICAL IMPRESSIONS 02/18/2020 Clinical Impression Pt presents with grossly normal swallow function.  There was no penetration or aspiration with any consistencies trialed.  Oral phase was efficient and well controlled.  Swallow initiation was timely.  Recommend regular texture diet with thin liquids. No further ST needs for dysphagia.  SLP will sign off for swallowing. SLP Visit Diagnosis Dysphagia, unspecified (R13.10) Attention and concentration deficit following -- Frontal lobe and executive function deficit following -- Impact on safety and function No limitations   CHL IP TREATMENT RECOMMENDATION 02/18/2020 Treatment Recommendations No treatment recommended at this time   No flowsheet data found. CHL IP DIET RECOMMENDATION 02/18/2020 SLP Diet Recommendations Regular solids;Thin liquid Liquid Administration via Cup;Straw Medication Administration Whole meds with liquid Compensations Slow rate;Small sips/bites Postural Changes Seated upright at 90 degrees   CHL IP OTHER RECOMMENDATIONS 02/18/2020 Recommended Consults -- Oral Care Recommendations Oral care BID Other Recommendations --   CHL IP FOLLOW UP RECOMMENDATIONS 02/18/2020 Follow up Recommendations None   CHL IP FREQUENCY AND DURATION 02/18/2020 Speech Therapy Frequency (ACUTE ONLY) (No Data) Treatment Duration --      CHL IP ORAL PHASE 02/18/2020 Oral Phase WFL Oral - Pudding Teaspoon -- Oral - Pudding Cup -- Oral - Honey Teaspoon -- Oral - Honey Cup -- Oral - Nectar Teaspoon -- Oral - Nectar Cup -- Oral  - Nectar Straw -- Oral - Thin Teaspoon -- Oral - Thin Cup WFL Oral - Thin Straw WFL Oral - Puree WFL Oral - Mech Soft -- Oral - Regular WFL Oral - Multi-Consistency -- Oral - Pill WFL Oral Phase - Comment --  CHL IP PHARYNGEAL PHASE 02/18/2020 Pharyngeal Phase WFL Pharyngeal- Pudding Teaspoon -- Pharyngeal -- Pharyngeal- Pudding Cup -- Pharyngeal -- Pharyngeal- Honey Teaspoon -- Pharyngeal -- Pharyngeal- Honey Cup -- Pharyngeal -- Pharyngeal- Nectar Teaspoon -- Pharyngeal -- Pharyngeal- Nectar Cup -- Pharyngeal -- Pharyngeal- Nectar Straw -- Pharyngeal -- Pharyngeal- Thin Teaspoon -- Pharyngeal -- Pharyngeal- Thin Cup [facility] Pharyngeal Material does not enter airway Pharyngeal- Thin Straw WFL Pharyngeal Material does not enter airway Pharyngeal- Puree WFL Pharyngeal Material does not enter airway Pharyngeal- Mechanical Soft -- Pharyngeal -- Pharyngeal- Regular WFL Pharyngeal Material does not enter airway Pharyngeal- Multi-consistency -- Pharyngeal --  Pharyngeal- Pill [facility] Pharyngeal Material does not enter airway Pharyngeal Comment --  CHL IP CERVICAL ESOPHAGEAL PHASE 02/18/2020 Cervical Esophageal Phase WFL Pudding Teaspoon -- Pudding Cup -- Honey Teaspoon -- Honey Cup -- Nectar Teaspoon -- Nectar Cup -- Nectar Straw -- Thin Teaspoon -- Thin Cup -- Thin Straw -- Puree -- Mechanical Soft -- Regular -- Multi-consistency -- Pill -- Cervical Esophageal Comment -- Celedonio Savage, MA, CCC-SLP Acute Rehabilitation Services Office: [phone] 02/18/2020, 12:09 PM              ECHOCARDIOGRAM COMPLETE  Result Date: 02/17/2020    ECHOCARDIOGRAM REPORT   Patient Name:   AVIS MCMAHILL Date of Exam: 02/17/2020 Medical Rec #:  [id number]      Height:       71.0 in Accession #:    [id number]     Weight:       200.0 lb Date of Birth:  03-25-1941      BSA:          2.109 m Patient Age:    23 years       BP:           137/89 mmHg Patient Gender: M              HR:           68 bpm. Exam Location:  Inpatient Procedure: 2D Echo  Indications:    right sided weakness  History:        Patient has no prior history of Echocardiogram examinations.                 Risk Factors:Former Smoker. Lung cancer.  Sonographer:    Jannett Celestine RDCS (AE) Referring Phys: [place]  Sonographer Comments: limited mobility due to right sided weakness. no true parasternal window IMPRESSIONS  1. Left ventricular ejection fraction, by estimation, is 60 to 65%. The left ventricle has normal function. The left ventricle has no regional wall motion abnormalities. Left ventricular diastolic parameters were normal.  2. Right ventricular systolic function is normal. The right ventricular size is normal.  3. The mitral valve is normal in structure. No evidence of mitral valve regurgitation. No evidence of mitral stenosis.  4. The aortic valve is normal in structure. Aortic valve regurgitation is not visualized. No aortic stenosis is present.  5. The inferior vena cava is normal in size with greater than 50% respiratory variability, suggesting right atrial pressure of 3 mmHg. FINDINGS  Left Ventricle: Left ventricular ejection fraction, by estimation, is 60 to 65%. The left ventricle has normal function. The left ventricle has no regional wall motion abnormalities. The left ventricular internal cavity size was normal in size. There is  no left ventricular hypertrophy. Left ventricular diastolic parameters were normal. Normal left ventricular filling pressure. Right Ventricle: The right ventricular size is normal. No increase in right ventricular wall thickness. Right ventricular systolic function is normal. Left Atrium: Left atrial size was normal in size. Right Atrium: Right atrial size was normal in size. Pericardium: There is no evidence of pericardial effusion. Mitral Valve: The mitral valve is normal in structure. No evidence of mitral valve regurgitation. No evidence of mitral valve stenosis. Tricuspid Valve: The tricuspid valve is normal in structure.  Tricuspid valve regurgitation is not demonstrated. No evidence of tricuspid stenosis. Aortic Valve: The aortic valve is normal in structure. Aortic valve regurgitation is not visualized. No aortic stenosis is present. Pulmonic Valve: The pulmonic valve was not well visualized.  Pulmonic valve regurgitation is not visualized. No evidence of pulmonic stenosis. Aorta: The aortic root is normal in size and structure. Venous: The inferior vena cava is normal in size with greater than 50% respiratory variability, suggesting right atrial pressure of 3 mmHg. IAS/Shunts: No atrial level shunt detected by color flow Doppler.  LEFT VENTRICLE PLAX 2D LVIDd:         3.50 cm  Diastology LVIDs:         2.20 cm  LV e' medial:    6.64 cm/s LV PW:         0.80 cm  LV E/e' medial:  8.6 LV IVS:        0.90 cm  LV e' lateral:   9.46 cm/s LVOT diam:     2.40 cm  LV E/e' lateral: 6.1 LV SV:         76 LV SV Index:   36 LVOT Area:     4.52 cm  RIGHT VENTRICLE RV S prime:     10.60 cm/s TAPSE (M-mode): 1.6 cm LEFT ATRIUM         Index LA diam:    3.90 cm 1.85 cm/m  AORTIC VALVE LVOT Vmax:   82.90 cm/s LVOT Vmean:  63.100 cm/s LVOT VTI:    0.168 m  AORTA Ao Root diam: 3.10 cm MITRAL VALVE MV Area (PHT): 2.97 cm    SHUNTS MV Decel Time: 255 msec    Systemic VTI:  0.17 m MV E velocity: 57.40 cm/s  Systemic Diam: 2.40 cm MV A velocity: 58.30 cm/s MV E/A ratio:  0.98 Mihai Croitoru MD Electronically signed by Sanda Klein MD Signature Date/Time: 02/17/2020/2:45:59 PM    Final     Assessment/Plan: Diagnosis: 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   Stroke: Continue secondary stroke prophylaxis and Risk Factor Modification listed below:   Antiplatelet therapy:   Blood Pressure Management:  Continue current medication with prn's with permisive HTN per primary team Statin Agent:   Prediabetes management:   Right sided hemiparesis: fit for orthosis to prevent contractures (resting hand splint for day, wrist cock up splint at  night, PRAFO, etc) PT/OT for mobility, ADL training  Labs and images (see above) independently reviewed.  Records reviewed and summated above.  1. Does the need for close, 24 hr/day medical supervision in concert with the patient's rehab needs make it unreasonable for this patient to be served in a less intensive setting? Yes  2. Co-Morbidities requiring supervision/potential complications: HTN (monitor and provide prns in accordance with increased physical exertion and pain), atypical carcinoid tumor, BPH with TURP (monitor for retention), remote smoker, prediabetes (SSI, monitor in accordance with exercise and adjust meds as necessary), dyslipidemia (Lipitor) 3. Due to bladder management, safety, disease management, medication administration and patient education, does the patient require 24 hr/day rehab nursing? Yes 4. Does the patient require coordinated care of a physician, rehab nurse, therapy disciplines of PT/OT/SLP to address physical and functional deficits in the context of the above medical diagnosis(es)? Yes Addressing deficits in the following areas: balance, endurance, locomotion, strength, transferring, bathing, dressing, speech and psychosocial support 5. Can the patient actively participate in an intensive therapy program of at least 3 hrs of therapy per day at least 5 days per week? Yes 6. The potential for patient to make measurable gains while on inpatient rehab is excellent 7. Anticipated functional outcomes upon discharge from inpatient rehab are supervision and min assist  with PT, supervision and min assist with  OT, modified independent with SLP. 8. Estimated rehab length of stay to reach the above functional goals is: 19-23 days. 9. Anticipated discharge destination: Home 10. Overall Rehab/Functional Prognosis: good  RECOMMENDATIONS: This patient's condition is appropriate for continued rehabilitative care in the following setting: CIR Patient has agreed to participate in  recommended program. Yes Note that insurance prior authorization may be required for reimbursement for recommended care.  Comment: Rehab Admissions Coordinator to follow up.  I have personally performed a face to face diagnostic evaluation, including, but not limited to relevant history and physical exam findings, of this patient and developed relevant assessment and plan.  Additionally, I have reviewed and concur with the physician assistant's documentation above.   Delice Lesch, MD, ABPMR Lavon Paganini Angiulli, PA-C 02/18/2020

## 2020-02-18 NOTE — Progress Notes (Signed)
Modified Barium Swallow Progress Note  Patient Details  Name: VETO MACQUEEN MRN: [id number] Date of Birth: Nov 29, 1940  Today's Date: 02/18/2020  Modified Barium Swallow completed.  Full report located under Chart Review in the Imaging Section.  Brief recommendations include the following:  Clinical Impression  Pt presents with grossly normal swallow function.  There was no penetration or aspiration with any consistencies trialed.  Oral phase was efficient and well controlled.  Swallow initiation was timely.  Recommend regular texture diet with thin liquids. No further ST needs for dysphagia.  SLP will sign off for swallowing.   Swallow Evaluation Recommendations       SLP Diet Recommendations: Regular solids;Thin liquid   Liquid Administration via: Cup;Straw   Medication Administration: Whole meds with liquid   Supervision: Staff to assist with self feeding (as needed)   Compensations: Slow rate;Small sips/bites   Postural Changes: Seated upright at 90 degrees   Oral Care Recommendations: Oral care BID        Celedonio Savage , Vivian, [place] Office: [phone]  02/18/2020,11:37 AM

## 2020-02-18 NOTE — PMR Pre-admission (Signed)
PMR Admission Coordinator Pre-Admission Assessment  Patient: Hector [name] is an 79 y.o., male MRN: [id number] DOB: 11/28/1940 Height: _0  (180.3 cm) Weight: 90.7 kg              Insurance Information HMO:    PPO: yes       PCP:      IPA:      80/20:      OTHER:  PRIMARY: [employer]      Policy#: [id number]      Subscriber: Patient  CM Name:  Phineas Real     Phone#: [phone]  Fax#: [phone] clinical updates due 03/54 Pre-Cert#: [id number]      Employer:  Benefits:  Phone #:      Name:  Irene Shipper. Date: 05/09/2019  Deductible: does not have for in-network providers OOP Max: $7,550 [phone] met) CIR: $360/day co-pay for days 1-6, $0/day co-pay for days 7-90 SNF: 100% coverage, 0% co-insurance; limited by medical necessity Outpatient:  $15-$40/visit co-pay pending service; visits limited by medical necessity Home Health:  100% coverage, 0% co-insurance; limited by medical necessity Providers: in network  SECONDARY:       Policy#:       Phone#:   Emergency Contact Information Contact Information    Name Relation Home Work Mobile   [place] Spouse [place] Daughter [phone]       Current Medical History  Patient Admitting Diagnosis: CVA History of Present Illness: Hector [name] is a 79 y.o. right-handed male with history of hypertension, atypical carcinoid tumor, BPH with TURP, remote smoker.  History taken from chart review and patient.  Patient lives with spouse in [place] and has been staying in the area with daughter after wife's recent hysterectomy.  Independent prior to admission.  1 level home 2-3 steps to entry.  He presented on 02/16/2020 with right hemiparesis and dysarthria.  Cranial CT scan showed few scattered hypoattenuating foci in the basal ganglia and left external capsule.  CT angiogram of head and neck no emergent large vessel occlusion.  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  No hemorrhage or  mass-effect.  Echocardiogram with ejection fraction of 60-65%, no wall motion abnormalities.  Hospital course further complicated by prediabetes.  Patient with associated dysarthria.  Currently maintained on aspirin and Plavix for CVA prophylaxis x3 weeks and aspirin alone.  Subcutaneous Lovenox for DVT prophylaxis.  Tolerating regular diet.  Therapy evaluations completed with recommendations of physical medicine rehab consult.  Complete NIHSS TOTAL: 7 Glasgow Coma Scale Score: 15  Past Medical History  Past Medical History:  Diagnosis Date  . Diverticulitis 04/2012   admission [facility]  . Former smoker    Smoked for 20 yrs;  Quit  25 yrs ago.  Marland Kitchen GERD (gastroesophageal reflux disease)   . Hypertension   . Hypokalemia   . Lung cancer ([place])   . 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 Polycythemia   . Prostatitis    S/P  TURP    Family History  family history includes Cancer in his paternal uncle; Heart disease (age of onset: 70) in his father; Stroke (age of onset: 14) in his mother.  Prior Rehab/Hospitalizations:  Has the patient had prior rehab or hospitalizations prior to admission? Yes  Has the patient had major surgery during 100 days prior to admission? No  Current Medications   Current Facility-Administered Medications:  .  0.9 %  sodium chloride infusion, , Intravenous, Continuous,  Donnetta Simpers, MD, Last Rate: 100 mL/hr at 02/20/20 1017, New Bag at 02/20/20 1017 .  acetaminophen (TYLENOL) tablet 650 mg, 650 mg, Oral, Q4H PRN **OR** acetaminophen (TYLENOL) 160 MG/5ML solution 650 mg, 650 mg, Per Tube, Q4H PRN **OR** acetaminophen (TYLENOL) suppository 650 mg, 650 mg, Rectal, Q4H PRN, Dwyane Dee, MD .  aspirin EC tablet 81 mg, 81 mg, Oral, Daily, Pahwani, Ravi, MD, 81 mg at 02/20/20 1010 .  atenolol (TENORMIN) tablet 50 mg, 50 mg, Oral, Daily, Pahwani, Ravi, MD, 50 mg at 02/20/20 1010 .  atorvastatin (LIPITOR) tablet 20 mg, 20 mg, Oral, Daily, Biby,  Sharon L, NP, 20 mg at 02/20/20 1014 .  clopidogrel (PLAVIX) tablet 75 mg, 75 mg, Oral, Daily, Donnetta Simpers, MD, 75 mg at 02/20/20 1010 .  losartan (COZAAR) tablet 25 mg, 25 mg, Oral, Daily, Pahwani, Ravi, MD, 25 mg at 02/20/20 1010 .  senna-docusate (Senokot-S) tablet 1 tablet, 1 tablet, Oral, QHS PRN, Dwyane Dee, MD  Patients Current Diet:  Diet Order            Diet Heart Room service appropriate? Yes; Fluid consistency: Thin  Diet effective now                 Precautions / Restrictions Precautions Precautions: Fall, Other (comment) Precaution Comments: R sided weakness, chronic peripheral neuropathy Restrictions Weight Bearing Restrictions: No   Has the patient had 2 or more falls or a fall with injury in the past year?Yes  Prior Activity Level  Pt. Was active in the community, going out daily   Prior Functional Level Prior Function Level of Independence: Independent Comments: still some mild dysarthria  Self Care: Did the patient need help bathing, dressing, using the toilet or eating?  Independent  Indoor Mobility: Did the patient need assistance with walking from room to room (with or without device)? Independent  Stairs: Did the patient need assistance with internal or external stairs (with or without device)? Independent  Functional Cognition: Did the patient need help planning regular tasks such as shopping or remembering to take medications? Independent  Home Assistive Devices / Equipment Home Assistive Devices/Equipment: None Home Equipment: None  Prior Device Use: Indicate devices/aids used by the patient prior to current illness, exacerbation or injury? None of the above  Current Functional Level Cognition  Arousal/Alertness: Awake/alert Overall Cognitive Status: Within Functional Limits for tasks assessed Orientation Level: Oriented X4 Memory: Appears intact (recalled 5/5 words independently) Awareness: Appears intact Problem Solving:  Appears intact Behaviors:  (pt became tearful during session several times) Safety/Judgment: Appears intact    Extremity Assessment (includes Sensation/Coordination)  Upper Extremity Assessment: RUE deficits/detail RUE Deficits / Details: Brunstrum II (has some shoulder flexion/extension, elbow flexion, forerarm pronation, wrist flexion/extension, finger flexion/extension) RUE Coordination: decreased fine motor, decreased gross motor  Lower Extremity Assessment: RLE deficits/detail, LLE deficits/detail RLE Deficits / Details: ankle dorsiflexion 2+ to 3-/5, hip flexor 3/5, quad 2+ to 3-/5 RLE Sensation: history of peripheral neuropathy, decreased proprioception RLE Coordination: decreased gross motor LLE Deficits / Details: WNL LLE Sensation: history of peripheral neuropathy, WNL LLE Coordination: WNL    ADLs  Overall ADL's : Needs assistance/impaired Eating/Feeding: Set up Eating/Feeding Details (indicate cue type and reason): sitting in recliner Grooming: Oral care Grooming Details (indicate cue type and reason): standing at sink with sara stedy, minguard-Mod A for standing balance Upper Body Bathing: Minimal assistance Upper Body Bathing Details (indicate cue type and reason): sitting in recliner Lower Body Bathing: Maximal assistance Lower Body Bathing Details (  indicate cue type and reason): min A sit<>stand with sara stedy Upper Body Dressing : Moderate assistance Upper Body Dressing Details (indicate cue type and reason): sitting in recliner Lower Body Dressing: Maximal assistance Lower Body Dressing Details (indicate cue type and reason): min A sit<>stand with sara stedy Toilet Transfer: Minimal assistance Toilet Transfer Details (indicate cue type and reason): with sara stedy Toileting- Clothing Manipulation and Hygiene: Maximal assistance Toileting - Clothing Manipulation Details (indicate cue type and reason): min A sit<>stand in sara stedy    Mobility  Overal bed  mobility: Needs Assistance Bed Mobility: Rolling, Sidelying to Sit Rolling: Mod assist Sidelying to sit: Mod assist Supine to sit: HOB elevated, Total assist Sit to supine: Max assist General bed mobility comments: much improved today- ModA needed mostly for trunk control due to ongoing dynamic unsteadiness but quickly able to find midline sitting EOB    Transfers  Overall transfer level: Needs assistance Equipment used: Ambulation equipment used Transfer via Lift Equipment: Stedy Transfers: Sit to/from Stand Sit to Stand: Min assist General transfer comment: MinA to boost to full upright standing in stedy- Mod cues for weight shift right due to tendency to offload R LE but able to correct with min-mod facilitation. RLE fatigues easily and with occasional buckling as fatigue increased    Ambulation / Gait / Stairs / Wheelchair Mobility  Ambulation/Gait General Gait Details: deferred    Posture / Balance Dynamic Sitting Balance Sitting balance - Comments: min guard for safety, improved since eval Balance Overall balance assessment: Needs assistance Sitting-balance support: Bilateral upper extremity supported, Single extremity supported Sitting balance-Leahy Scale: Fair Sitting balance - Comments: min guard for safety, improved since eval Postural control: Posterior lean Standing balance support: During functional activity Standing balance-Leahy Scale: Poor Standing balance comment: reliant on external support, tendency for L lean and somewhat difficult to find midline    Special needs/care consideration none     Previous Home Environment (from acute therapy documentation) Living Arrangements: Spouse/significant other, Children  Lives With: Spouse Available Help at Discharge: Family, Available PRN/intermittently Type of Home: House Home Layout: One level Home Access: Stairs to enter Entrance Stairs-Rails: None Entrance Stairs-Number of Steps: 2-3 STE no rails Bathroom  Shower/Tub: Gaffer, Other (comment) Bathroom Toilet: Standard Bathroom Accessibility: Yes Home Care Services: No Additional Comments: patient/spouse life in [place] but are staying with daughter as wife recovers from surgery, OK to stay with daughter as he recovers too  Discharge Living Setting Plans for Discharge Living Setting: Patient's home Type of Home at Discharge: House Discharge Home Layout: Multi-level Alternate Level Stairs-Rails: Right Alternate Level Stairs-Number of Steps: Full flight Discharge Home Access: Stairs to enter Entrance Stairs-Rails: Right Entrance Stairs-Number of Steps: 15 Discharge Bathroom Shower/Tub: Tub/shower unit, Walk-in shower Discharge Bathroom Toilet: Standard Discharge Bathroom Accessibility: No Does the patient have any problems obtaining your medications?: No  Social/Family/Support Systems Patient Roles: Spouse Contact Information: [phone] Anticipated Caregiver: Abran Gavigan  Anticipated Caregiver's Contact Information: [phone] Caregiver Availability: 24/7 Discharge Plan Discussed with Primary Caregiver: Yes Is Caregiver In Agreement with Plan?: Yes   Goals Patient/Family Goal for Rehab: PT/OT/SLP Min A Expected length of stay: 19-23 days Pt/Family Agrees to Admission and willing to participate: Yes Program Orientation Provided & Reviewed with Pt/Caregiver Including Roles  & Responsibilities: Yes   Decrease burden of Care through IP rehab admission: Specialzed equipment needs, Decrease number of caregivers and Patient/family education   Possible need for SNF placement upon discharge:Not anticipated   Patient Condition: This patient's  condition remains as documented in the consult dated 02/18/20, in which the Rehabilitation Physician determined and documented that the patient's condition is appropriate for intensive rehabilitative care in an inpatient rehabilitation facility. Will admit to inpatient rehab  today.  Preadmission Screen Completed By:  Genella Mech, CCC-SLP, 02/20/2020 1:28 PM ______________________________________________________________________   Discussed status with Dr. Ranell Patrick on 02/20/20 at 930am received approval for admission today.  Admission Coordinator:  Genella Mech, time 02/20/20/Date 9:30

## 2020-02-18 NOTE — Progress Notes (Signed)
Inpatient Rehab Admissions Coordinator Note:   Per therapy recommendations, pt was screened for CIR candidacy by Will Schier, MS CCC-SLP. At this time, Pt. Appears to have functional decline and is a good candidate for CIR. Will place order for rehab consult per protocol.  Please contact me with questions.   Dajha Urquilla, MS, CCC-SLP Rehab Admissions Coordinator  [phone] (celll) [phone] (office)  

## 2020-02-18 NOTE — Progress Notes (Signed)
OT Cancellation Note  Patient Details Name: Hector [name] MRN: [id number] DOB: [birth year]   Cancelled Treatment:    Reason Eval/Treat Not Completed: (P) Patient at procedure or test/ unavailable (MBS. Will return later. )  Donalda Job,HILLARY 02/18/2020, 11:08 AM  Maurie Boettcher, OT/L   Acute OT Clinical Specialist Acute Rehabilitation Services Pager [phone] Office [phone]

## 2020-02-19 LAB — CBC WITH DIFFERENTIAL/PLATELET
Abs Immature Granulocytes: 0.03 10*3/uL (ref 0.00–0.07)
Basophils Absolute: 0 10*3/uL (ref 0.0–0.1)
Basophils Relative: 1 %
Eosinophils Absolute: 0.3 10*3/uL (ref 0.0–0.5)
Eosinophils Relative: 5 %
HCT: 47.8 % (ref 39.0–52.0)
Hemoglobin: 16.2 g/dL (ref 13.0–17.0)
Immature Granulocytes: 1 %
Lymphocytes Relative: 23 %
Lymphs Abs: 1.5 10*3/uL (ref 0.7–4.0)
MCH: 30.8 pg (ref 26.0–34.0)
MCHC: 33.9 g/dL (ref 30.0–36.0)
MCV: 90.9 fL (ref 80.0–100.0)
Monocytes Absolute: 0.6 10*3/uL (ref 0.1–1.0)
Monocytes Relative: 9 %
Neutro Abs: 3.9 10*3/uL (ref 1.7–7.7)
Neutrophils Relative %: 61 %
Platelets: 179 10*3/uL (ref 150–400)
RBC: 5.26 MIL/uL (ref 4.22–5.81)
RDW: 12.2 % (ref 11.5–15.5)
WBC: 6.3 10*3/uL (ref 4.0–10.5)
nRBC: 0 % (ref 0.0–0.2)

## 2020-02-19 LAB — ERYTHROPOIETIN: Erythropoietin: 16.7 m[IU]/mL (ref 2.6–18.5)

## 2020-02-19 LAB — BASIC METABOLIC PANEL
Anion gap: 7 (ref 5–15)
BUN: 10 mg/dL (ref 8–23)
CO2: 23 mmol/L (ref 22–32)
Calcium: 8.9 mg/dL (ref 8.9–10.3)
Chloride: 107 mmol/L (ref 98–111)
Creatinine, Ser: 1.02 mg/dL (ref 0.61–1.24)
GFR, Estimated: >60 mL/min (ref >60)
Glucose, Bld: 122 mg/dL — ABNORMAL HIGH (ref 70–99)
Potassium: 3.7 mmol/L (ref 3.5–5.1)
Sodium: 137 mmol/L (ref 135–145)

## 2020-02-19 LAB — MAGNESIUM: Magnesium: 2 mg/dL (ref 1.7–2.4)

## 2020-02-19 NOTE — Progress Notes (Signed)
STROKE TEAM PROGRESS NOTE   INTERVAL HISTORY His daughter is at the bedside.  He has had improvement in his R hand movement.  He can now move the right upper extremity against gravity but still has weakness of right grip and intrinsic hand muscles.  He has been seen by therapist who recommended inpatient rehab.  Vital signs stable.  No new neuro complaints.   Vitals:   02/18/20 2000 02/18/20 2346 02/19/20 0332 02/19/20 0956  BP: (!) 149/84 (!) 157/87 (!) 171/84 (!) 158/89  Pulse: 69 (!) 59 68 75  Resp: 18 16 18 20   Temp: 98.2 F (36.8 C) 98 F (36.7 C) 98 F (36.7 C) 97.9 F (36.6 C)  TempSrc: Oral   Oral  SpO2: 95% 96% 96% 97%  Weight:      Height:       CBC:  Recent Labs  Lab 02/18/20 0124 02/19/20 0633  WBC 8.3 6.3  NEUTROABS 5.7 3.9  HGB 16.2 16.2  HCT 47.8 47.8  MCV 91.9 90.9  PLT 187 809   Basic Metabolic Panel:  Recent Labs  Lab 02/18/20 0124 02/19/20 0633  NA 139 137  K 3.9 3.7  CL 104 107  CO2 26 23  GLUCOSE 123* 122*  BUN 11 10  CREATININE 1.07 1.02  CALCIUM 9.2 8.9  MG 1.9 2.0   Lipid Panel:  Recent Labs  Lab 02/17/20 1236  CHOL 148  TRIG 179*  HDL 35*  CHOLHDL 4.2  VLDL 36  LDLCALC 77   HgbA1c:  Recent Labs  Lab 02/17/20 1236  HGBA1C 6.2*   Urine Drug Screen: No results for input(s): LABOPIA, COCAINSCRNUR, LABBENZ, AMPHETMU, THCU, LABBARB in the last 168 hours.  Alcohol Level No results for input(s): ETH in the last 168 hours.  IMAGING past 24 hours DG Swallowing Func-Speech Pathology  Result Date: 02/18/2020 Objective Swallowing Evaluation: Type of Study: MBS-Modified Barium Swallow Study  Patient Details Name: Hector [name] MRN: [id number] Date of Birth: 1940-06-12 Today's Date: 02/18/2020 Time: SLP Start Time (ACUTE ONLY): 1106 -SLP Stop Time (ACUTE ONLY): 1113 SLP Time Calculation (min) (ACUTE ONLY): 7 min Past Medical History: Past Medical History: Diagnosis Date . Diverticulitis 04/2012  admission [facility] . Former  smoker   Smoked for 20 yrs;  Quit  25 yrs ago. Marland Kitchen GERD (gastroesophageal reflux disease)  . Hypertension  . Hypokalemia  . Lung cancer ([place])  . Personal history of colonic adenoma 04/2009 . Polycythemia  . Prostatitis   S/P  TURP Past Surgical History: Past Surgical History: Procedure Laterality Date . APPENDECTOMY  02/25/11 . COLONOSCOPY   . LOBECTOMY   . TRANSURETHRAL RESECTION OF PROSTATE  2004 HPI: 79 yo male adm to [facility] with right sided weakness, also has polycythemia, lung cancer hx s/p VATS and lobectomy, colonic adenoma, insomnia, HTN.  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  Speech eval ordered.  Subjective: Pt awake, alert, pleasant, participative.  Daughter present for evaluation Assessment / Plan / Recommendation CHL IP CLINICAL IMPRESSIONS 02/18/2020 Clinical Impression Pt presents with grossly normal swallow function.  There was no penetration or aspiration with any consistencies trialed.  Oral phase was efficient and well controlled.  Swallow initiation was timely.  Recommend regular texture diet with thin liquids. No further ST needs for dysphagia.  SLP will sign off for swallowing. SLP Visit Diagnosis Dysphagia, unspecified (R13.10) Attention and concentration deficit following -- Frontal lobe and executive function deficit  following -- Impact on safety and function No limitations   CHL IP TREATMENT RECOMMENDATION 02/18/2020 Treatment Recommendations No treatment recommended at this time   No flowsheet data found. CHL IP DIET RECOMMENDATION 02/18/2020 SLP Diet Recommendations Regular solids;Thin liquid Liquid Administration via Cup;Straw Medication Administration Whole meds with liquid Compensations Slow rate;Small sips/bites Postural Changes Seated upright at 90 degrees   CHL IP OTHER RECOMMENDATIONS 02/18/2020 Recommended Consults -- Oral Care Recommendations Oral care BID Other Recommendations --   CHL IP FOLLOW UP RECOMMENDATIONS  02/18/2020 Follow up Recommendations None   CHL IP FREQUENCY AND DURATION 02/18/2020 Speech Therapy Frequency (ACUTE ONLY) (No Data) Treatment Duration --      CHL IP ORAL PHASE 02/18/2020 Oral Phase WFL Oral - Pudding Teaspoon -- Oral - Pudding Cup -- Oral - Honey Teaspoon -- Oral - Honey Cup -- Oral - Nectar Teaspoon -- Oral - Nectar Cup -- Oral - Nectar Straw -- Oral - Thin Teaspoon -- Oral - Thin Cup WFL Oral - Thin Straw WFL Oral - Puree WFL Oral - Mech Soft -- Oral - Regular WFL Oral - Multi-Consistency -- Oral - Pill WFL Oral Phase - Comment --  CHL IP PHARYNGEAL PHASE 02/18/2020 Pharyngeal Phase WFL Pharyngeal- Pudding Teaspoon -- Pharyngeal -- Pharyngeal- Pudding Cup -- Pharyngeal -- Pharyngeal- Honey Teaspoon -- Pharyngeal -- Pharyngeal- Honey Cup -- Pharyngeal -- Pharyngeal- Nectar Teaspoon -- Pharyngeal -- Pharyngeal- Nectar Cup -- Pharyngeal -- Pharyngeal- Nectar Straw -- Pharyngeal -- Pharyngeal- Thin Teaspoon -- Pharyngeal -- Pharyngeal- Thin Cup [facility] Pharyngeal Material does not enter airway Pharyngeal- Thin Straw WFL Pharyngeal Material does not enter airway Pharyngeal- Puree WFL Pharyngeal Material does not enter airway Pharyngeal- Mechanical Soft -- Pharyngeal -- Pharyngeal- Regular WFL Pharyngeal Material does not enter airway Pharyngeal- Multi-consistency -- Pharyngeal -- Pharyngeal- Pill WFL Pharyngeal Material does not enter airway Pharyngeal Comment --  CHL IP CERVICAL ESOPHAGEAL PHASE 02/18/2020 Cervical Esophageal Phase WFL Pudding Teaspoon -- Pudding Cup -- Honey Teaspoon -- Honey Cup -- Nectar Teaspoon -- Nectar Cup -- Nectar Straw -- Thin Teaspoon -- Thin Cup -- Thin Straw -- Puree -- Mechanical Soft -- Regular -- Multi-consistency -- Pill -- Cervical Esophageal Comment -- Celedonio Savage, MA, CCC-SLP Acute Rehabilitation Services Office: [phone] 02/18/2020, 12:09 PM               PHYSICAL EXAM    Pleasant elderly Caucasian male not in distress. . Afebrile. Head is nontraumatic.  Neck is supple without bruit.    Cardiac exam no murmur or gallop. Lungs are clear to auscultation. Distal pulses are well felt. Neurological Exam :   he is awake alert oriented to time place and person.  Follows commands well.  Mild dysarthria.  Moderate right lower facial weakness.  Tongue midline.  Extraocular movements are full range without nystagmus.  Blinks to threat bilaterally.  Motor system exam shows both right upper and slight right lower extremity drift.  Right upper extremity strength is 2/5 and right lower extremities 4/5.  Coordination impaired on the right.  Sensation preserved bilaterally.  Gait not tested.   ASSESSMENT/PLAN Hector [name] is a 79 y.o. male with history of lung caner, polycythemia, BPH s/p TURP, HTN, GERD, diverticulitis, former smoker presenting with recurrent episodes of R sided weakness and abnormal speech.   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 No large abnormality. No acute  abnormality. Small vessel disease. Atrophy.     CTA head & neck no LVO. Small vessel disease. Atrophy.   MRI  Small L paramedian pontine infarct   2D Echo EF 60-65%. No source of embolus   LDL 77  HgbA1c 6.2  VTE prophylaxis - Lovenox 40 mg sq daily   aspirin 81 mg daily prior to admission, now on aspirin 325 mg daily and clopidogrel 75 mg daily following plavix load. Continue DAPT x 3 weeks then plavix alone.   Therapy recommendations:  CIR   Disposition:  pending   Hypertension  Stable . Permissive hypertension (OK if < 220/120) but gradually normalize in 5-7 days . Long-term BP goal normotensive  Hyperlipidemia  Home meds:  No statin  Add lipitor 20    LDL 77, goal < 70  Continue statin at discharge  Other Stroke Risk Factors  Advanced age  Former Cigarette smoker, quit 31 yrs ago  ETOH use, advised to drink no more than 2  drink(s) a day  Overweight, Body mass index is 27.89 kg/m., recommend weight loss, diet and exercise at least 30 mins a day  Family hx stroke (mother)  Other Active Problems  Hx lung cancer  Colonic adenoma   Polycythemia  Prostatitis s/p TURP  Hospital day # 2 Recommend continue ongoing therapies and aspirin Plavix for 3 weeks followed by Plavix alone.  Aggressive risk factor modification.  Transfer to inpatient rehab in the next few days when bed available.  Discussed with patient, daughter and Dr. Darliss Cheney and answered questions.  Greater than 50% time during this 25-minute visit was spent on counseling and coordination of care about his lacunar stroke and right hemiparesis and answering questions.  Stroke team will sign off.  Follow-up as an outpatient stroke clinic in 6 weeks. Antony Contras, MD  To contact Stroke Continuity provider, please refer to [email or web address] After hours, contact General Neurology

## 2020-02-19 NOTE — Progress Notes (Signed)
PROGRESS NOTE    TROOPER OLANDER  GUR:[id number] DOB: 05-14-1940 DOA: 02/16/2020 PCP: Pcp, No   Brief Narrative:  Mr. Hangartner is a 79 yo CM with PMH atypical carcinoid tumor,, BPH s/p TURP, HTN, GERD, diverticulitis who initially presented to [facility] on Monday after he had developed right sided weakness over the weekend as well as a change in his speech.  He states that he last felt normal on Saturday and waking up on Sunday morning but shortly after, he noted that his left face felt tingly and he then started to have difficulty with his speech. he also had right-sided weakness to the point of having an unstable gait.   Most of his symptoms resolved later on Sunday but again recurred on Monday and did not improve, thus he sought attention. He underwent imaging studies given the suspicion for stroke. 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 CTA head/neck was also negative for LVO or significant carotid stenosis.  He was again noted to have chronic ischemic microangiopathy and generalized atrophy.He was transferred to [facility] for MRI brain and further stroke work-up.Of note, prior office notes dating back to 2017 revealed that the patient had complaints of paresthesias in his extremities and stocking glove distribution of numbness without pain.  He describes sensations that felt like his feet were not touching the ground.  He was on oral vitamin B12 already at that time.  After admission to [facility], he underwent MRI brain which showed small acute infarct of the left paramedian pons.  No hemorrhage.  He was seen by neurology.  He was already on aspirin, Plavix was added.  Lipid panel showed LDL of 77.  He was started on atorvastatin 20 mg.  He was seen by PT OT who recommended CIR.  Assessment & Plan:   Principal Problem:   Right  sided weakness Active Problems:   Polycythemia   Essential hypertension   Acute ischemic stroke (HCC)   Malignant neoplasm of left lung (HCC)   Cerebral thrombosis with cerebral infarction   Benign prostatic hyperplasia   Prediabetes   Right hemiparesis (HCC)   Dyslipidemia   Acute CVA/right-sided weakness: CTH shows B/L basal ganglia infarts and left external capsule (age indeterminate but considered remote).  MRI brain shows small acute infarct of the left paramedian pons.  No hemorrhage.  CTA head and neck rule out any LVO.  Patient is still with right-sided weakness.  No other complaint.  Neurology on board.  He remains on DAPT.  Will need to continue DAPT for 3 weeks and then stop aspirin but continue Plavix.  Evaluated by PT.  Awaiting CIR insurance authorization.  Essential hypertension: Blood pressure fairly controlled.  Continue to hold losartan for 1 more day.  Hyperlipidemia: Triglyceride 179.  LDL 77.  On low-dose atorvastatin 20 mg per neurology.  Malignant neoplasm of left lung: Follows oncology outpatient at [facility].  Last seen 01/15/2020. s/p previous laser ablations with residual tumor afterwards. Then treated with bronchoscopy, left VATS lower lobe sleeve lobectomy and MLND on 09/26/2019.  DVT prophylaxis:    Code Status: Full Code  Family Communication: Daughter present at bedside.  Plan of care discussed with patient and his daughter in length and he verbalized understanding and agreed with it.  Status is: Inpatient  Remains inpatient appropriate because:Inpatient level of care appropriate due to severity of illness  Dispo: The patient is from: Home              Anticipated d/c is to: CIR              Anticipated d/c date is: 1 day              Patient currently is medically stable to d/c.        Estimated body mass index is 27.89 kg/m as calculated from the following:   Height as of this encounter: 5\' 11"  (1.803 m).   Weight as of this encounter: 90.7  kg.      Nutritional status:               Consultants:   Neurology  Procedures:   None  Antimicrobials:  Anti-infectives (From admission, onward)   None         Subjective: Seen and examined.  Daughter at the bedside.  Still weak on the right side but improving compared to yesterday.  Objective: Vitals:   02/18/20 2346 02/19/20 0332 02/19/20 0956 02/19/20 1208  BP: (!) 157/87 (!) 171/84 (!) 158/89 (!) 150/92  Pulse: (!) 59 68 75 77  Resp: 16 18 20 18   Temp: 98 F (36.7 C) 98 F (36.7 C) 97.9 F (36.6 C) 97.6 F (36.4 C)  TempSrc:   Oral Oral  SpO2: 96% 96% 97% 96%  Weight:      Height:        Intake/Output Summary (Last 24 hours) at 02/19/2020 1455 Last data filed at 02/19/2020 0841 Gross per 24 hour  Intake 250 ml  Output 1520 ml  Net -1270 ml   Filed Weights   02/16/20 1450  Weight: 90.7 kg    Examination:  General exam: Appears calm and comfortable  Respiratory system: Clear to auscultation. Respiratory effort normal. Cardiovascular system: S1 & S2 heard, RRR. No JVD, murmurs, rubs, gallops or clicks. No pedal edema. Gastrointestinal system: Abdomen is nondistended, soft and nontender. No organomegaly or masses felt. Normal bowel sounds heard. Central nervous system: Alert and oriented.  3/5 power in right upper and lower extremity, improved compared to yesterday. Skin: No rashes, lesions or ulcers.  Psychiatry: Judgement and insight appear normal. Mood & affect appropriate.   Data Reviewed: I have personally reviewed following labs and imaging studies  CBC: Recent Labs  Lab 02/16/20 1458 02/17/20 1236 02/18/20 0124 02/19/20 0633  WBC 7.8 7.0 8.3 6.3  NEUTROABS 4.9  --  5.7 3.9  HGB 17.5* 17.0 16.2 16.2  HCT 51.6 50.0 47.8 47.8  MCV 93.5 91.7 91.9 90.9  PLT 217 181 187 161   Basic Metabolic Panel: Recent Labs  Lab 02/16/20 1458 02/17/20 1236 02/18/20 0124 02/19/20 0633  NA 137 138 139 137  K 3.7 4.1 3.9 3.7  CL  101 103 104 107  CO2 27 26 26 23   GLUCOSE 152* 139* 123* 122*  BUN 15 12 11 10   CREATININE 1.20 1.19 1.07 1.02  CALCIUM 9.6 9.5 9.2 8.9  MG  --   --  1.9 2.0   GFR: Estimated Creatinine Clearance: 67.7 mL/min (by C-G formula based on SCr of 1.02 mg/dL). Liver Function Tests: Recent Labs  Lab 02/16/20 1458 02/17/20 1236  AST 23 22  ALT 26 29  ALKPHOS 55 48  BILITOT 0.6 0.9  PROT 7.2 6.6  ALBUMIN 4.3 4.0   No results for input(s): LIPASE, AMYLASE in the last 168 hours. No results for input(s): AMMONIA in the last 168  hours. Coagulation Profile: No results for input(s): INR, PROTIME in the last 168 hours. Cardiac Enzymes: No results for input(s): CKTOTAL, CKMB, CKMBINDEX, TROPONINI in the last 168 hours. BNP (last 3 results) No results for input(s): PROBNP in the last 8760 hours. HbA1C: Recent Labs    02/17/20 1236  HGBA1C 6.2*   CBG: No results for input(s): GLUCAP in the last 168 hours. Lipid Profile: Recent Labs    02/17/20 1236  CHOL 148  HDL 35*  LDLCALC 77  TRIG 179*  CHOLHDL 4.2   Thyroid Function Tests: Recent Labs    02/17/20 1236  TSH 1.444   Anemia Panel: Recent Labs    02/17/20 2040  VITAMINB12 514   Sepsis Labs: No results for input(s): PROCALCITON, LATICACIDVEN in the last 168 hours.  Recent Results (from the past 240 hour(s))  Respiratory Panel by RT PCR (Flu A&B, Covid) - Nasopharyngeal Swab     Status: None   Collection Time: 02/16/20  3:29 PM   Specimen: Nasopharyngeal Swab  Result Value Ref Range Status   SARS Coronavirus 2 by RT PCR NEGATIVE NEGATIVE Final    Comment: (NOTE) SARS-CoV-2 target nucleic acids are NOT DETECTED.  The SARS-CoV-2 RNA is generally detectable in upper respiratoy specimens during the acute phase of infection. The lowest concentration of SARS-CoV-2 viral copies this assay can detect is 131 copies/mL. A negative result does not preclude SARS-Cov-2 infection and should not be used as the sole basis for  treatment or other patient management decisions. A negative result may occur with  improper specimen collection/handling, submission of specimen other than nasopharyngeal swab, presence of viral mutation(s) within the areas targeted by this assay, and inadequate number of viral copies (<131 copies/mL). A negative result must be combined with clinical observations, patient history, and epidemiological information. The expected result is Negative.  Fact Sheet for Patients:  PinkCheek.be  Fact Sheet for Healthcare Providers:  GravelBags.it  This test is no t yet approved or cleared by the [place] FDA and  has been authorized for detection and/or diagnosis of SARS-CoV-2 by FDA under an Emergency Use Authorization (EUA). This EUA will remain  in effect (meaning this test can be used) for the duration of the COVID-19 declaration under Section 564(b)(1) of the Act, 21 U.S.C. section 360bbb-3(b)(1), unless the authorization is terminated or revoked sooner.     Influenza A by PCR NEGATIVE NEGATIVE Final   Influenza B by PCR NEGATIVE NEGATIVE Final    Comment: (NOTE) The Xpert Xpress SARS-CoV-2/FLU/RSV assay is intended as an aid in  the diagnosis of influenza from Nasopharyngeal swab specimens and  should not be used as a sole basis for treatment. Nasal washings and  aspirates are unacceptable for Xpert Xpress SARS-CoV-2/FLU/RSV  testing.  Fact Sheet for Patients: PinkCheek.be  Fact Sheet for Healthcare Providers: GravelBags.it  This test is not yet approved or cleared by the [place] FDA and  has been authorized for detection and/or diagnosis of SARS-CoV-2 by  FDA under an Emergency Use Authorization (EUA). This EUA will remain  in effect (meaning this test can be used) for the duration of the  Covid-19 declaration under Section 564(b)(1) of the Act, 21   U.S.C. section 360bbb-3(b)(1), unless the authorization is  terminated or revoked. Performed at [facility], [address] [place], [place] [address]   Urine culture     Status: None   Collection Time: 02/16/20  3:29 PM   Specimen: Urine, Clean Catch  Result Value  Ref Range Status   Specimen Description   Final    URINE, CLEAN CATCH Performed at [facility], [address] [place], [place] [address]    Special Requests   Final    Normal Performed at [facility], [address] [place], [place] [address]    Culture   Final    NO GROWTH Performed at [facility], [place] [address], [place], [place] [address]    Report Status 02/18/2020 FINAL  Final      Radiology Studies: MR BRAIN WO CONTRAST  Result Date: 02/17/2020 CLINICAL DATA:  Right-sided weakness EXAM: MRI HEAD WITHOUT CONTRAST TECHNIQUE: Multiplanar, multiecho pulse sequences of the brain and surrounding structures were obtained without intravenous contrast. COMPARISON:  None. 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 No acute hemorrhage. Multifocal hyperintense T2-weighted signal within the white matter. There is generalized atrophy without lobar predilection. No chronic microhemorrhage. Normal midline structures. Vascular: Normal flow voids. Skull and upper cervical spine: Normal marrow signal. Sinuses/Orbits: Negative. Other: None. 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 No hemorrhage or mass effect. Electronically Signed   By: Ulyses Jarred M.D.   On: 02/17/2020 19:08   DG Swallowing Func-Speech Pathology  Result Date: 02/18/2020 Objective Swallowing Evaluation: Type of Study: MBS-Modified Barium Swallow Study  Patient Details Name: MATAN STEEN MRN: [id number] Date of Birth: 03-14-1941 Today's Date: 02/18/2020 Time: SLP Start Time (ACUTE ONLY): 1106 -SLP Stop Time (ACUTE ONLY): 1113 SLP Time Calculation (min) (ACUTE ONLY):  7 min Past Medical History: Past Medical History: Diagnosis Date  Diverticulitis 04/2012  admission [facility]  Former smoker   Smoked for 20 yrs;  Quit  25 yrs ago.  GERD (gastroesophageal reflux disease)   Hypertension   Hypokalemia   Lung cancer ([place])   Personal history of colonic adenoma 04/2009  Polycythemia   Prostatitis   S/P  TURP Past Surgical History: Past Surgical History: Procedure Laterality Date  APPENDECTOMY  02/25/11  COLONOSCOPY    LOBECTOMY    TRANSURETHRAL RESECTION OF PROSTATE  2004 HPI: 79 yo male adm to [facility] with right sided weakness, also has polycythemia, lung cancer hx s/p VATS and lobectomy, colonic adenoma, insomnia, HTN.  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  Speech eval ordered.  Subjective: Pt awake, alert, pleasant, participative.  Daughter present for evaluation Assessment / Plan / Recommendation CHL IP CLINICAL IMPRESSIONS 02/18/2020 Clinical Impression Pt presents with grossly normal swallow function.  There was no penetration or aspiration with any consistencies trialed.  Oral phase was efficient and well controlled.  Swallow initiation was timely.  Recommend regular texture diet with thin liquids. No further ST needs for dysphagia.  SLP will sign off for swallowing. SLP Visit Diagnosis Dysphagia, unspecified (R13.10) Attention and concentration deficit following -- Frontal lobe and executive function deficit following -- Impact on safety and function No limitations   CHL IP TREATMENT RECOMMENDATION 02/18/2020 Treatment Recommendations No treatment recommended at this time   No flowsheet data found. CHL IP DIET RECOMMENDATION 02/18/2020 SLP Diet Recommendations Regular solids;Thin liquid Liquid Administration via Cup;Straw Medication Administration Whole meds with liquid Compensations Slow rate;Small sips/bites Postural Changes Seated upright at 90 degrees   CHL IP OTHER RECOMMENDATIONS 02/18/2020  Recommended Consults -- Oral Care Recommendations Oral care BID Other Recommendations --   CHL IP FOLLOW UP RECOMMENDATIONS 02/18/2020 Follow up Recommendations  None   CHL IP FREQUENCY AND DURATION 02/18/2020 Speech Therapy Frequency (ACUTE ONLY) (No Data) Treatment Duration --      CHL IP ORAL PHASE 02/18/2020 Oral Phase WFL Oral - Pudding Teaspoon -- Oral - Pudding Cup -- Oral - Honey Teaspoon -- Oral - Honey Cup -- Oral - Nectar Teaspoon -- Oral - Nectar Cup -- Oral - Nectar Straw -- Oral - Thin Teaspoon -- Oral - Thin Cup WFL Oral - Thin Straw WFL Oral - Puree WFL Oral - Mech Soft -- Oral - Regular WFL Oral - Multi-Consistency -- Oral - Pill WFL Oral Phase - Comment --  CHL IP PHARYNGEAL PHASE 02/18/2020 Pharyngeal Phase WFL Pharyngeal- Pudding Teaspoon -- Pharyngeal -- Pharyngeal- Pudding Cup -- Pharyngeal -- Pharyngeal- Honey Teaspoon -- Pharyngeal -- Pharyngeal- Honey Cup -- Pharyngeal -- Pharyngeal- Nectar Teaspoon -- Pharyngeal -- Pharyngeal- Nectar Cup -- Pharyngeal -- Pharyngeal- Nectar Straw -- Pharyngeal -- Pharyngeal- Thin Teaspoon -- Pharyngeal -- Pharyngeal- Thin Cup [facility] Pharyngeal Material does not enter airway Pharyngeal- Thin Straw WFL Pharyngeal Material does not enter airway Pharyngeal- Puree WFL Pharyngeal Material does not enter airway Pharyngeal- Mechanical Soft -- Pharyngeal -- Pharyngeal- Regular WFL Pharyngeal Material does not enter airway Pharyngeal- Multi-consistency -- Pharyngeal -- Pharyngeal- Pill WFL Pharyngeal Material does not enter airway Pharyngeal Comment --  CHL IP CERVICAL ESOPHAGEAL PHASE 02/18/2020 Cervical Esophageal Phase WFL Pudding Teaspoon -- Pudding Cup -- Honey Teaspoon -- Honey Cup -- Nectar Teaspoon -- Nectar Cup -- Nectar Straw -- Thin Teaspoon -- Thin Cup -- Thin Straw -- Puree -- Mechanical Soft -- Regular -- Multi-consistency -- Pill -- Cervical Esophageal Comment -- Celedonio Savage, MA, CCC-SLP Acute Rehabilitation Services Office: [phone] 02/18/2020, 12:09  PM               Scheduled Meds:  aspirin EC  81 mg Oral Daily   atorvastatin  20 mg Oral Daily   clopidogrel  75 mg Oral Daily   Continuous Infusions:  sodium chloride 100 mL/hr at 02/19/20 0835     LOS: 2 days   Time spent: 30 minutes   Darliss Cheney, MD [facility]  02/19/2020, 2:55 PM   To contact the attending provider between 7A-7P or the covering provider during after hours 7P-7A, please log into the web site [email or web address].

## 2020-02-19 NOTE — H&P (Signed)
Physical Medicine and Rehabilitation Admission H&P    Chief Complaint  Patient presents with  . Gait Problem  : HPI: Hector [name]. Hector [name] is a 79 year old right-handed male with history of hypertension, atypical carcinoid tumor, BPH with TURP remote smoker.  He lives with spouse in [place] has been staying in the area with daughter after wife recent hysterectomy.  Independent prior to admission.  1 level home 2-3 steps to entry.  Presented 02/16/2020 with right side weakness and dysarthria.  Cranial CT scan showed few scattered hypoattenuating foci in the basal ganglia and left external capsule.  CT angiogram of head and neck no emergent large vessel occlusion.  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  No hemorrhage or mass-effect.  Echocardiogram with ejection fraction of 60 to 65% no wall motion abnormalities.  Admission chemistries hemoglobin 17.5, glucose 152, urinalysis negative nitrite, hemoglobin A1c 6.2, TSH 1.44.  Maintained on aspirin and Plavix x3 weeks for CVA prophylaxis followed by Plavix alone.  Subcutaneous   Tolerating a regular diet.  Therapy evaluations completed and patient was admitted for a comprehensive rehab program.  Review of Systems  Constitutional: Negative for chills and fever.  HENT: Negative for hearing loss.   Eyes: Negative for blurred vision and double vision.  Respiratory: Negative for cough and shortness of breath.   Cardiovascular: Negative for chest pain, palpitations and claudication.  Gastrointestinal: Positive for constipation. Negative for heartburn, nausea and vomiting.       GERD   Genitourinary: Positive for urgency.  Musculoskeletal: Positive for myalgias.  Skin: Negative for rash.  Neurological: Positive for sensory change, speech change and weakness.  Psychiatric/Behavioral:       Anxiety  All other systems reviewed and are negative.  Past Medical History:  Diagnosis Date  . Diverticulitis 04/2012   admission [facility]  . Former smoker    Smoked for 20 yrs;  Quit  25 yrs ago.  Marland Kitchen GERD (gastroesophageal reflux disease)   . Hypertension   . Hypokalemia   . Lung cancer ([place])   . 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 Polycythemia   . Prostatitis    S/P  TURP   Past Surgical History:  Procedure Laterality Date  . APPENDECTOMY  02/25/11  . COLONOSCOPY    . LOBECTOMY    . TRANSURETHRAL RESECTION OF PROSTATE  2004   Family History  Problem Relation Age of Onset  . Stroke Mother 19  . Heart disease Father 98  . Cancer Paternal Uncle        Carcinoma of unknown origin.  . Colon cancer Neg Hx    Social History:  reports that he quit smoking about 31 years ago. He has never used smokeless tobacco. He reports current alcohol use of about 7.0 standard drinks of alcohol per week. He reports that he does not use drugs. Allergies: No Known Allergies Medications Prior to Admission  Medication Sig Dispense Refill  . acetaminophen (TYLENOL) 500 MG tablet Take 500-1,000 mg by mouth every 6 (six) hours as needed for mild pain or headache.    Marland Kitchen aspirin 81 MG tablet Take 81 mg by mouth at bedtime.     Marland Kitchen atenolol (TENORMIN) 50 MG tablet TAKE ONE (1) TABLET EACH DAY (Patient taking differently: Take 50 mg by mouth daily. ) 90 tablet 1  . diazepam (VALIUM) 5 MG tablet Take 5 mg by mouth 2 (two) times daily as needed for anxiety.    . fluticasone (  FLONASE) 50 MCG/ACT nasal spray Place 1-2 sprays into both nostrils daily as needed for allergies or rhinitis.    Marland Kitchen losartan (COZAAR) 25 MG tablet Take 25 mg by mouth at bedtime.     . sucralfate (CARAFATE) 1 g tablet Take 1 g by mouth in the morning and at bedtime.     . vitamin B-12 (CYANOCOBALAMIN) 1000 MCG tablet Take 1,000 mcg by mouth daily.        Drug Regimen Review Drug regimen was reviewed and remains appropriate with no significant issues identified  Home: Home Living Family/patient expects to be discharged to:: Private residence Living  Arrangements: Spouse/significant other, Children (wife just had hysterectomy) Available Help at Discharge: Family, Available PRN/intermittently (but daughter can be available 24/7 if needed) Type of Home: House Home Access: Stairs to enter [employer] of Steps: 2-3 STE no rails Entrance Stairs-Rails: None Home Layout: One level Bathroom Shower/Tub: Walk-in shower, Other (comment) (walkin is very small shower with no space for seat; has other bathroom with more space but would have to step over tub) Bathroom Toilet: Standard Bathroom Accessibility: Yes Home Equipment: None Additional Comments: patient/spouse life in [place] but are staying with daughter as wife recovers from surgery, OK to stay with daughter as he recovers too  Lives With: Spouse   Functional History: Prior Function Level of Independence: Independent Comments: still some mild dysarthria  Functional Status:  Mobility: Bed Mobility Overal bed mobility: Needs Assistance Bed Mobility: Supine to Sit, Sit to Supine Supine to sit: HOB elevated, Total assist Sit to supine: Max assist General bed mobility comments: able to get BLEs off EOB but then needed totalA to bring trunk up and scoot hips around to EOB; with return to bed, able to manage trunk but needed MaxA for BLEs Transfers Overall transfer level: Needs assistance Equipment used: Rolling Hector [name] (2 wheeled) Transfers: Sit to/from Stand Sit to Stand: Total assist, From elevated surface General transfer comment: able to perform partial stand from elevated surface, but unable to fully extend hips into standing position; minimal coordination/activation R LE Ambulation/Gait General Gait Details: unable    ADL:    Cognition: Cognition Overall Cognitive Status: Within Functional Limits for tasks assessed Arousal/Alertness: Awake/alert Orientation Level: Oriented X4 Memory: Appears intact (recalled 5/5 words independently) Awareness: Appears  intact Problem Solving: Appears intact Behaviors:  (pt became tearful during session several times) Safety/Judgment: Appears intact Cognition Arousal/Alertness: Awake/alert Behavior During Therapy: WFL for tasks assessed/performed Overall Cognitive Status: Within Functional Limits for tasks assessed  Physical Exam: Blood pressure (!) 171/84, pulse 68, temperature 98 F (36.7 C), resp. rate 18, height 5\' 11"  (1.803 m), weight 90.7 kg, SpO2 96 %. Physical Exam   General: Alert and oriented x 3, No apparent distress HEENT: Head is normocephalic, atraumatic, PERRLA, EOMI, sclera anicteric, oral mucosa pink and moist, dentition intact, ext ear canals clear,  Neck: Supple without JVD or lymphadenopathy Heart: Reg rate and rhythm. No murmurs rubs or gallops Chest: CTA bilaterally without wheezes, rales, or rhonchi; no distress Abdomen: Soft, non-tender, non-distended, bowel sounds positive. Extremities: No clubbing, cyanosis, or edema. Pulses are 2+ Skin: Clean and intact without signs of breakdown Neuro: Pt is cognitively appropriate with normal insight, memory, and awareness. Cranial nerves 2-12 are intact.  Motor function is grossly 5/5 on left side.  LUE: 2/5 EE, EF, 3/5 WE, 0/5 hand grip LLE: 4-/5 throughout with 3/5 DF/PF MSK: Right shoulder with tenderness to palpation Psych: Pt's affect is appropriate. Pt is cooperative   Results  for orders placed or performed during the hospital encounter of 02/16/20 (from the past 48 hour(s))  Hemoglobin A1c     Status: Abnormal   Collection Time: 02/17/20 12:36 PM  Result Value Ref Range   Hgb A1c MFr Bld 6.2 (H) 4.8 - 5.6 %    Comment: (NOTE) Pre diabetes:          5.7%-6.4%  Diabetes:              >6.4%  Glycemic control for   <7.0% adults with diabetes    Mean Plasma Glucose 131.24 mg/dL    Comment: Performed at [facility], [place] [address], [place], [place] [address]  Lipid panel     Status: Abnormal   Collection Time:  02/17/20 12:36 PM  Result Value Ref Range   Cholesterol 148 0 - 200 mg/dL   Triglycerides 179 (H) <150 mg/dL   HDL 35 (L) >40 mg/dL   Total CHOL/HDL Ratio 4.2 RATIO   VLDL 36 0 - 40 mg/dL   LDL Cholesterol 77 0 - 99 mg/dL    Comment:        Total Cholesterol/HDL:CHD Risk Coronary Heart Disease Risk Table                     Men   Women  1/2 Average Risk   3.4   3.3  Average Risk       5.0   4.4  2 X Average Risk   9.6   7.1  3 X Average Risk  23.4   11.0        Use the calculated Patient Ratio above and the CHD Risk Table to determine the patient's CHD Risk.        ATP III CLASSIFICATION (LDL):  <100     mg/dL   Optimal  100-129  mg/dL   Near or Above                    Optimal  130-159  mg/dL   Borderline  160-189  mg/dL   High  >190     mg/dL   Very High Performed at [facility] [address] [place] [address]   CBC     Status: None   Collection Time: 02/17/20 12:36 PM  Result Value Ref Range   WBC 7.0 4.0 - 10.5 K/uL   RBC 5.45 4.22 - 5.81 MIL/uL   Hemoglobin 17.0 13.0 - 17.0 g/dL   HCT 50.0 39 - 52 %   MCV 91.7 80.0 - 100.0 fL   MCH 31.2 26.0 - 34.0 pg   MCHC 34.0 30.0 - 36.0 g/dL   RDW 12.3 11.5 - 15.5 %   Platelets 181 150 - 400 K/uL   nRBC 0.0 0.0 - 0.2 %    Comment: Performed at [facility], [place] [address], [place], [place] [address]  TSH     Status: None   Collection Time: 02/17/20 12:36 PM  Result Value Ref Range   TSH 1.444 0.350 - 4.500 uIU/mL    Comment: Performed by a 3rd Generation assay with a functional sensitivity of <=0.01 uIU/mL. Performed at [facility], [place] [address] [place], [place] [address]   Comprehensive metabolic panel     Status: Abnormal   Collection Time: 02/17/20 12:36 PM  Result Value Ref Range   Sodium 138 135 - 145 mmol/L   Potassium 4.1 3.5 - 5.1 mmol/L  Chloride 103 98 - 111 mmol/L   CO2 26 22 - 32 mmol/L   Glucose, Bld 139 (H) 70 - 99 mg/dL    Comment: Glucose reference range applies  only to samples taken after fasting for at least 8 hours.   BUN 12 8 - 23 mg/dL   Creatinine, Ser 1.19 0.61 - 1.24 mg/dL   Calcium 9.5 8.9 - 10.3 mg/dL   Total Protein 6.6 6.5 - 8.1 g/dL   Albumin 4.0 3.5 - 5.0 g/dL   AST 22 15 - 41 U/L   ALT 29 0 - 44 U/L   Alkaline Phosphatase 48 38 - 126 U/L   Total Bilirubin 0.9 0.3 - 1.2 mg/dL   GFR, Estimated 58 (L) >60 mL/min   Anion gap 9 5 - 15    Comment: Performed at [facility] [address], [place], [place] [address]  Vitamin B12     Status: None   Collection Time: 02/17/20  8:40 PM  Result Value Ref Range   Vitamin B-12 514 180 - 914 pg/mL    Comment: (NOTE) This assay is not validated for testing neonatal or myeloproliferative syndrome specimens for Vitamin B12 levels. Performed at [facility], [place] [address] [place], [place] [address]   Basic metabolic panel     Status: Abnormal   Collection Time: 02/18/20  1:24 AM  Result Value Ref Range   Sodium 139 135 - 145 mmol/L   Potassium 3.9 3.5 - 5.1 mmol/L   Chloride 104 98 - 111 mmol/L   CO2 26 22 - 32 mmol/L   Glucose, Bld 123 (H) 70 - 99 mg/dL    Comment: Glucose reference range applies only to samples taken after fasting for at least 8 hours.   BUN 11 8 - 23 mg/dL   Creatinine, Ser 1.07 0.61 - 1.24 mg/dL   Calcium 9.2 8.9 - 10.3 mg/dL   GFR, Estimated >60 >60 mL/min   Anion gap 9 5 - 15    Comment: Performed at [facility] [address], [place], [place] [address]  CBC with Differential/Platelet     Status: None   Collection Time: 02/18/20  1:24 AM  Result Value Ref Range   WBC 8.3 4.0 - 10.5 K/uL   RBC 5.20 4.22 - 5.81 MIL/uL   Hemoglobin 16.2 13.0 - 17.0 g/dL   HCT 47.8 39 - 52 %   MCV 91.9 80.0 - 100.0 fL   MCH 31.2 26.0 - 34.0 pg   MCHC 33.9 30.0 - 36.0 g/dL   RDW 12.3 11.5 - 15.5 %   Platelets 187 150 - 400 K/uL   nRBC 0.0 0.0 - 0.2 %   Neutrophils Relative % 67 %   Neutro Abs 5.7 1.7 - 7.7 K/uL   Lymphocytes Relative 19 %   Lymphs  Abs 1.6 0.7 - 4.0 K/uL   Monocytes Relative 9 %   Monocytes Absolute 0.8 0.1 - 1.0 K/uL   Eosinophils Relative 3 %   Eosinophils Absolute 0.2 0.0 - 0.5 K/uL   Basophils Relative 1 %   Basophils Absolute 0.1 0.0 - 0.1 K/uL   Immature Granulocytes 1 %   Abs Immature Granulocytes 0.07 0.00 - 0.07 K/uL    Comment: Performed at [facility], [address] [place], [place] [address]  Magnesium     Status: None   Collection Time: 02/18/20  1:24 AM  Result Value Ref Range   Magnesium 1.9 1.7 -  2.4 mg/dL    Comment: Performed at [facility], [place] [address] [place], [place] [address]   MR BRAIN WO CONTRAST  Result Date: 02/17/2020 CLINICAL DATA:  Right-sided weakness EXAM: MRI HEAD WITHOUT CONTRAST TECHNIQUE: Multiplanar, multiecho pulse sequences of the brain and surrounding structures were obtained without intravenous contrast. COMPARISON:  None. 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 No acute hemorrhage. Multifocal hyperintense T2-weighted signal within the white matter. There is generalized atrophy without lobar predilection. No chronic microhemorrhage. Normal midline structures. Vascular: Normal flow voids. Skull and upper cervical spine: Normal marrow signal. Sinuses/Orbits: Negative. Other: None. 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 No hemorrhage or mass effect. Electronically Signed   By: Ulyses Jarred M.D.   On: 02/17/2020 19:08   DG Swallowing Func-Speech Pathology  Result Date: 02/18/2020 Objective Swallowing Evaluation: Type of Study: MBS-Modified Barium Swallow Study  Patient Details Name: JOHNCARLOS HOLTSCLAW MRN: [id number] Date of Birth: Aug 11, 1940 Today's Date: 02/18/2020 Time: SLP Start Time (ACUTE ONLY): 1106 -SLP Stop Time (ACUTE ONLY): 1113 SLP Time Calculation (min) (ACUTE ONLY): 7 min Past Medical History: Past Medical History: Diagnosis Date . Diverticulitis 04/2012  admission [facility] . Former smoker    Smoked for 20 yrs;  Quit  25 yrs ago. Marland Kitchen GERD (gastroesophageal reflux disease)  . Hypertension  . Hypokalemia  . Lung cancer ([place])  . Personal history of colonic adenoma 04/2009 . Polycythemia  . Prostatitis   S/P  TURP Past Surgical History: Past Surgical History: Procedure Laterality Date . APPENDECTOMY  02/25/11 . COLONOSCOPY   . LOBECTOMY   . TRANSURETHRAL RESECTION OF PROSTATE  2004 HPI: 79 yo male adm to [facility] with right sided weakness, also has polycythemia, lung cancer hx s/p VATS and lobectomy, colonic adenoma, insomnia, HTN.  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  Speech eval ordered.  Subjective: Pt awake, alert, pleasant, participative.  Daughter present for evaluation Assessment / Plan / Recommendation CHL IP CLINICAL IMPRESSIONS 02/18/2020 Clinical Impression Pt presents with grossly normal swallow function.  There was no penetration or aspiration with any consistencies trialed.  Oral phase was efficient and well controlled.  Swallow initiation was timely.  Recommend regular texture diet with thin liquids. No further ST needs for dysphagia.  SLP will sign off for swallowing. SLP Visit Diagnosis Dysphagia, unspecified (R13.10) Attention and concentration deficit following -- Frontal lobe and executive function deficit following -- Impact on safety and function No limitations   CHL IP TREATMENT RECOMMENDATION 02/18/2020 Treatment Recommendations No treatment recommended at this time   No flowsheet data found. CHL IP DIET RECOMMENDATION 02/18/2020 SLP Diet Recommendations Regular solids;Thin liquid Liquid Administration via Cup;Straw Medication Administration Whole meds with liquid Compensations Slow rate;Small sips/bites Postural Changes Seated upright at 90 degrees   CHL IP OTHER RECOMMENDATIONS 02/18/2020 Recommended Consults -- Oral Care Recommendations Oral care BID Other Recommendations --   CHL IP FOLLOW UP RECOMMENDATIONS 02/18/2020  Follow up Recommendations None   CHL IP FREQUENCY AND DURATION 02/18/2020 Speech Therapy Frequency (ACUTE ONLY) (No Data) Treatment Duration --      CHL IP ORAL PHASE 02/18/2020 Oral Phase WFL Oral - Pudding Teaspoon -- Oral - Pudding Cup -- Oral - Honey Teaspoon -- Oral - Honey Cup -- Oral - Nectar Teaspoon -- Oral - Nectar Cup -- Oral - Nectar Straw -- Oral - Thin Teaspoon -- Oral - Thin Cup  WFL Oral - Thin Straw WFL Oral - Puree WFL Oral - Mech Soft -- Oral - Regular WFL Oral - Multi-Consistency -- Oral - Pill WFL Oral Phase - Comment --  CHL IP PHARYNGEAL PHASE 02/18/2020 Pharyngeal Phase WFL Pharyngeal- Pudding Teaspoon -- Pharyngeal -- Pharyngeal- Pudding Cup -- Pharyngeal -- Pharyngeal- Honey Teaspoon -- Pharyngeal -- Pharyngeal- Honey Cup -- Pharyngeal -- Pharyngeal- Nectar Teaspoon -- Pharyngeal -- Pharyngeal- Nectar Cup -- Pharyngeal -- Pharyngeal- Nectar Straw -- Pharyngeal -- Pharyngeal- Thin Teaspoon -- Pharyngeal -- Pharyngeal- Thin Cup [facility] Pharyngeal Material does not enter airway Pharyngeal- Thin Straw WFL Pharyngeal Material does not enter airway Pharyngeal- Puree WFL Pharyngeal Material does not enter airway Pharyngeal- Mechanical Soft -- Pharyngeal -- Pharyngeal- Regular WFL Pharyngeal Material does not enter airway Pharyngeal- Multi-consistency -- Pharyngeal -- Pharyngeal- Pill WFL Pharyngeal Material does not enter airway Pharyngeal Comment --  CHL IP CERVICAL ESOPHAGEAL PHASE 02/18/2020 Cervical Esophageal Phase WFL Pudding Teaspoon -- Pudding Cup -- Honey Teaspoon -- Honey Cup -- Nectar Teaspoon -- Nectar Cup -- Nectar Straw -- Thin Teaspoon -- Thin Cup -- Thin Straw -- Puree -- Mechanical Soft -- Regular -- Multi-consistency -- Pill -- Cervical Esophageal Comment -- Celedonio Savage, MA, CCC-SLP Acute Rehabilitation Services Office: [phone] 02/18/2020, 12:09 PM              ECHOCARDIOGRAM COMPLETE  Result Date: 02/17/2020    ECHOCARDIOGRAM REPORT   Patient Name:   CHAWN SPRAGGINS Date  of Exam: 02/17/2020 Medical Rec #:  [id number]      Height:       71.0 in Accession #:    [id number]     Weight:       200.0 lb Date of Birth:  1941-03-23      BSA:          2.109 m Patient Age:    46 years       BP:           137/89 mmHg Patient Gender: M              HR:           68 bpm. Exam Location:  Inpatient Procedure: 2D Echo Indications:    right sided weakness  History:        Patient has no prior history of Echocardiogram examinations.                 Risk Factors:Former Smoker. Lung cancer.  Sonographer:    Jannett Celestine RDCS (AE) Referring Phys: [place]  Sonographer Comments: limited mobility due to right sided weakness. no true parasternal window IMPRESSIONS  1. Left ventricular ejection fraction, by estimation, is 60 to 65%. The left ventricle has normal function. The left ventricle has no regional wall motion abnormalities. Left ventricular diastolic parameters were normal.  2. Right ventricular systolic function is normal. The right ventricular size is normal.  3. The mitral valve is normal in structure. No evidence of mitral valve regurgitation. No evidence of mitral stenosis.  4. The aortic valve is normal in structure. Aortic valve regurgitation is not visualized. No aortic stenosis is present.  5. The inferior vena cava is normal in size with greater than 50% respiratory variability, suggesting right atrial pressure of 3 mmHg. FINDINGS  Left Ventricle: Left ventricular ejection fraction, by estimation, is 60 to 65%. The left ventricle has normal function. The left ventricle has no regional wall motion abnormalities. The left ventricular internal cavity size was normal  in size. There is  no left ventricular hypertrophy. Left ventricular diastolic parameters were normal. Normal left ventricular filling pressure. Right Ventricle: The right ventricular size is normal. No increase in right ventricular wall thickness. Right ventricular systolic function is normal. Left Atrium: Left atrial  size was normal in size. Right Atrium: Right atrial size was normal in size. Pericardium: There is no evidence of pericardial effusion. Mitral Valve: The mitral valve is normal in structure. No evidence of mitral valve regurgitation. No evidence of mitral valve stenosis. Tricuspid Valve: The tricuspid valve is normal in structure. Tricuspid valve regurgitation is not demonstrated. No evidence of tricuspid stenosis. Aortic Valve: The aortic valve is normal in structure. Aortic valve regurgitation is not visualized. No aortic stenosis is present. Pulmonic Valve: The pulmonic valve was not well visualized. Pulmonic valve regurgitation is not visualized. No evidence of pulmonic stenosis. Aorta: The aortic root is normal in size and structure. Venous: The inferior vena cava is normal in size with greater than 50% respiratory variability, suggesting right atrial pressure of 3 mmHg. IAS/Shunts: No atrial level shunt detected by color flow Doppler.  LEFT VENTRICLE PLAX 2D LVIDd:         3.50 cm  Diastology LVIDs:         2.20 cm  LV e' medial:    6.64 cm/s LV PW:         0.80 cm  LV E/e' medial:  8.6 LV IVS:        0.90 cm  LV e' lateral:   9.46 cm/s LVOT diam:     2.40 cm  LV E/e' lateral: 6.1 LV SV:         76 LV SV Index:   36 LVOT Area:     4.52 cm  RIGHT VENTRICLE RV S prime:     10.60 cm/s TAPSE (M-mode): 1.6 cm LEFT ATRIUM         Index LA diam:    3.90 cm 1.85 cm/m  AORTIC VALVE LVOT Vmax:   82.90 cm/s LVOT Vmean:  63.100 cm/s LVOT VTI:    0.168 m  AORTA Ao Root diam: 3.10 cm MITRAL VALVE MV Area (PHT): 2.97 cm    SHUNTS MV Decel Time: 255 msec    Systemic VTI:  0.17 m MV E velocity: 57.40 cm/s  Systemic Diam: 2.40 cm MV A velocity: 58.30 cm/s MV E/A ratio:  0.98 Mihai Croitoru MD Electronically signed by Sanda Klein MD Signature Date/Time: 02/17/2020/2:45:59 PM    Final        Medical Problem List and Plan: 1.  Right side weakness and dysarthria secondary to small left paramedian pontine infarct  secondary small vessel disease  -patient may shower  -ELOS/Goals: modI 12-16 days  -Admit to CIR 2.  Antithrombotics: -DVT/anticoagulation:    -antiplatelet therapy: Aspirin 81 mg daily and Plavix 75 mg daily x3 weeks followed by Plavix alone 3. Pain Management: Tylenol as needed. Well controlled 4. Mood: Provide emotional support  -antipsychotic agents: N/A 5. Neuropsych: This patient is capable of making decisions on his own behalf. 6. Skin/Wound Care: Routine skin checks. May d/c IV 7. Fluids/Electrolytes/Nutrition: Routine in and outs with follow-up chemistries. BMP stable on 10/15 8.  Permissive hypertension. Tenormin 50 mg daily, Cozaar 25 mg daily 9.  Malignant neoplasm left lung.  Followed at [facility] oncology 10.  BPH with TURP.  Check PVR 11.  Hyperlipidemia.  Lipitor 12. Moving bowels regularly.   Lavon Paganini Angiulli, PA-C 02/19/2020    I  have personally performed a face to face diagnostic evaluation, including, but not limited to relevant history and physical exam findings, of this patient and developed relevant assessment and plan.  Additionally, I have reviewed and concur with the physician assistant's documentation above.  Leeroy Cha, MD

## 2020-02-19 NOTE — Progress Notes (Signed)
Physical Therapy Treatment Patient Details Name: Hector [name] MRN: [id number] DOB: 10-Jul-1940 Today's Date: 02/19/2020    History of Present Illness 79yo male prsenting with R sided weakness and speech slurring/word finding difficulty. 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 No tpa given. PMH lung CA, HTN, lobectomy    PT Comments    Patient received in bed, speech definitely clearer today and with good compliance to LE HEP given yesterday. Shows great progress with mobility- had +2 for safety this session, but able to complete bed mobility with ModAx1 and functional transfers in stedy with MinAx1.  Would likely need +2 for safety with gait attempts given poor motor control and buckling R LE however. Worked on repeated sit to stands with facilitation for R weight bearing/weight shift in stedy with MinAx1 and mod VC/TC for technique. RLE does buckle quite a bit more when fatigued. Also used mirror at sink for midline standing with cues for visual feedback/corrections in mirror while brushing teeth with OT. Left up in recliner with all needs met, chair alarm active and nursing staff aware of patient status. Continue to recommend CIR.   Follow Up Recommendations  CIR     Equipment Recommendations  Other (comment) (defer to next venue)    Recommendations for Other Services       Precautions / Restrictions Precautions Precautions: Fall;Other (comment) Precaution Comments: R sided weakness, chronic peripheral neuropathy Restrictions Weight Bearing Restrictions: No    Mobility  Bed Mobility Overal bed mobility: Needs Assistance Bed Mobility: Rolling;Sidelying to Sit Rolling: Mod assist Sidelying to sit: Mod assist       General bed mobility comments: much improved today- ModA needed mostly for trunk control due to ongoing dynamic unsteadiness but quickly able to find midline sitting  EOB  Transfers Overall transfer level: Needs assistance Equipment used: Ambulation equipment used Transfers: Sit to/from Stand Sit to Stand: Min assist         General transfer comment: MinA to boost to full upright standing in stedy- Mod cues for weight shift right due to tendency to offload R LE but able to correct with min-mod facilitation. RLE fatigues easily and with occasional buckling as fatigue increased  Ambulation/Gait             General Gait Details: deferred   Stairs             Wheelchair Mobility    Modified Rankin (Stroke Patients Only)       Balance Overall balance assessment: Needs assistance Sitting-balance support: Bilateral upper extremity supported;Single extremity supported Sitting balance-Leahy Scale: Fair Sitting balance - Comments: min guard for safety, improved since eval   Standing balance support: During functional activity Standing balance-Leahy Scale: Poor Standing balance comment: reliant on external support, tendency for L lean and somewhat difficult to find midline                            Cognition Arousal/Alertness: Awake/alert Behavior During Therapy: WFL for tasks assessed/performed Overall Cognitive Status: Within Functional Limits for tasks assessed                                        Exercises      General Comments        Pertinent Vitals/Pain Pain Assessment:  No/denies pain Faces Pain Scale: No hurt Pain Intervention(s): Monitored during session    Home Living                      Prior Function            PT Goals (current goals can now be found in the care plan section) Acute Rehab PT Goals Patient Stated Goal: to go to rehab PT Goal Formulation: With patient/family Time For Goal Achievement: 03/03/20 Potential to Achieve Goals: Good Progress towards PT goals: Progressing toward goals    Frequency    Min 4X/week      PT Plan Current plan  remains appropriate    Co-evaluation PT/OT/SLP Co-Evaluation/Treatment: Yes Reason for Co-Treatment: Complexity of the patient's impairments (multi-system involvement);For patient/therapist safety;To address functional/ADL transfers PT goals addressed during session: Mobility/safety with mobility;Balance;Proper use of DME;Strengthening/ROM        AM-PAC PT "6 Clicks" Mobility   Outcome Measure  Help needed turning from your back to your side while in a flat bed without using bedrails?: A Lot Help needed moving from lying on your back to sitting on the side of a flat bed without using bedrails?: A Lot Help needed moving to and from a bed to a chair (including a wheelchair)?: A Lot Help needed standing up from a chair using your arms (e.g., wheelchair or bedside chair)?: A Lot Help needed to walk in hospital room?: Total Help needed climbing 3-5 steps with a railing? : Total 6 Click Score: 10    End of Session Equipment Utilized During Treatment: Gait belt Activity Tolerance: Patient tolerated treatment well Patient left: in chair;with call bell/phone within reach;with chair alarm set Nurse Communication: Mobility status;Need for lift equipment PT Visit Diagnosis: Unsteadiness on feet (R26.81);Difficulty in walking, not elsewhere classified (R26.2);Hemiplegia and hemiparesis;Other abnormalities of gait and mobility (R26.89) Hemiplegia - Right/Left: Right Hemiplegia - dominant/non-dominant: Dominant Hemiplegia - caused by: Cerebral infarction     Time: 1109-1140 PT Time Calculation (min) (ACUTE ONLY): 31 min  Charges:  $Neuromuscular Re-education: 8-22 mins (co-tx with OT)                     Windell Norfolk, DPT, PN1   Supplemental Physical Therapist [facility]    Pager [phone] Acute Rehab Office [phone]

## 2020-02-19 NOTE — Evaluation (Signed)
Occupational Therapy Evaluation Patient Details Name: Hector [name] MRN: [id number] DOB: 28-Feb-1941 Today's Date: 02/19/2020    History of Present Illness 79yo male prsenting with R sided weakness and speech slurring/word finding difficulty. 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 No tpa given. PMH lung CA, HTN, lobectomy   Clinical Impression   This 79 yo male admitted with above presents to acute OT at a PLOF of being totally independent with basic ADLs and IADLs. Currently he is setup/s-max A for basic ADLs. He will benefit from acute OT with follow up on CIR to get to an Mod I level.    Follow Up Recommendations  CIR;Supervision/Assistance - 24 hour    Equipment Recommendations  Other (comment) (TBD next venue)       Precautions / Restrictions Precautions Precautions: Fall;Other (comment) Precaution Comments: R sided weakness, chronic peripheral neuropathy Restrictions Weight Bearing Restrictions: No      Mobility Bed Mobility Overal bed mobility: Needs Assistance Bed Mobility: Rolling;Sidelying to Sit Rolling: Mod assist Sidelying to sit: Mod assist       General bed mobility comments: much improved today- ModA needed mostly for trunk control due to ongoing dynamic unsteadiness but quickly able to find midline sitting EOB  Transfers Overall transfer level: Needs assistance Equipment used: Ambulation equipment used Transfers: Sit to/from Stand Sit to Stand: Min assist         General transfer comment: MinA to boost to full upright standing in stedy- Mod cues for weight shift right due to tendency to offload R LE but able to correct with min-mod facilitation. RLE fatigues easily and with occasional buckling as fatigue increased    Balance Overall balance assessment: Needs assistance Sitting-balance support: Bilateral upper extremity supported;Single extremity  supported Sitting balance-Leahy Scale: Fair Sitting balance - Comments: min guard for safety, improved since eval Postural control: Posterior lean Standing balance support: During functional activity Standing balance-Leahy Scale: Poor Standing balance comment: reliant on external support, tendency for L lean and somewhat difficult to find midline                           ADL either performed or assessed with clinical judgement   ADL Overall ADL's : Needs assistance/impaired Eating/Feeding: Set up Eating/Feeding Details (indicate cue type and reason): sitting in recliner Grooming: Oral care Grooming Details (indicate cue type and reason): standing at sink with sara stedy, minguard-Mod A for standing balance Upper Body Bathing: Minimal assistance Upper Body Bathing Details (indicate cue type and reason): sitting in recliner Lower Body Bathing: Maximal assistance Lower Body Bathing Details (indicate cue type and reason): min A sit<>stand with sara stedy Upper Body Dressing : Moderate assistance Upper Body Dressing Details (indicate cue type and reason): sitting in recliner Lower Body Dressing: Maximal assistance Lower Body Dressing Details (indicate cue type and reason): min A sit<>stand with sara stedy Toilet Transfer: Minimal assistance Toilet Transfer Details (indicate cue type and reason): with sara stedy Toileting- Clothing Manipulation and Hygiene: Maximal assistance Toileting - Clothing Manipulation Details (indicate cue type and reason): min A sit<>stand in sara stedy             Vision Baseline Vision/History: Wears glasses Wears Glasses: Distance only Patient Visual Report: No change from baseline Vision Assessment?: Yes Eye Alignment: Within Functional Limits Ocular Range of Motion: Within Functional Limits Tracking/Visual Pursuits: Other (comment) (decreased smoothness of tracking  all quadrants) Saccades: Additional eye shifts occurred during  testing Visual Fields: No apparent deficits            Pertinent Vitals/Pain Pain Assessment: No/denies pain Faces Pain Scale: No hurt Pain Intervention(s): Monitored during session     Hand Dominance Right   Extremity/Trunk Assessment Upper Extremity Assessment Upper Extremity Assessment: RUE deficits/detail RUE Deficits / Details: Brunstrum II (has some shoulder flexion/extension, elbow flexion, forerarm pronation, wrist flexion/extension, finger flexion/extension) RUE Coordination: decreased fine motor;decreased gross motor              Cognition Arousal/Alertness: Awake/alert Behavior During Therapy: WFL for tasks assessed/performed Overall Cognitive Status: Within Functional Limits for tasks assessed                                                Home Living Family/patient expects to be discharged to:: Private residence Living Arrangements: Spouse/significant other;Children Available Help at Discharge: Family;Available PRN/intermittently Type of Home: House Home Access: Stairs to enter Entrance Stairs-Number of Steps: 2-3 STE no rails Entrance Stairs-Rails: None Home Layout: One level     Bathroom Shower/Tub: Walk-in shower;Other (comment)   Bathroom Toilet: Standard Bathroom Accessibility: Yes   Home Equipment: None   Additional Comments: patient/spouse life in [place] but are staying with daughter as wife recovers from surgery, OK to stay with daughter as he recovers too  Lives With: Spouse    Prior Functioning/Environment Level of Independence: Independent        Comments: still some mild dysarthria        OT Problem List: Decreased strength;Decreased range of motion;Impaired balance (sitting and/or standing);Impaired vision/perception;Impaired UE functional use;Decreased knowledge of use of DME or AE;Impaired tone;Decreased coordination      OT Treatment/Interventions: Self-care/ADL training;DME and/or AE  instruction;Patient/family education;Balance training;Visual/perceptual remediation/compensation;Therapeutic activities;Neuromuscular education    OT Goals(Current goals can be found in the care plan section) Acute Rehab OT Goals Patient Stated Goal: to go to rehab OT Goal Formulation: With patient Time For Goal Achievement: 03/04/20 Potential to Achieve Goals: Good  OT Frequency: Min 2X/week           Co-evaluation PT/OT/SLP Co-Evaluation/Treatment: Yes Reason for Co-Treatment: For patient/therapist safety;To address functional/ADL transfers PT goals addressed during session: Mobility/safety with mobility;Balance;Proper use of DME;Strengthening/ROM OT goals addressed during session: ADL's and self-care;Strengthening/ROM      AM-PAC OT "6 Clicks" Daily Activity     Outcome Measure Help from another person eating meals?: A Little Help from another person taking care of personal grooming?: A Lot Help from another person toileting, which includes using toliet, bedpan, or urinal?: A Lot Help from another person bathing (including washing, rinsing, drying)?: A Lot Help from another person to put on and taking off regular upper body clothing?: A Lot Help from another person to put on and taking off regular lower body clothing?: A Lot 6 Click Score: 13   End of Session Equipment Utilized During Treatment: Gait belt (sara stedy) Nurse Communication: Mobility status (NT--use sara stedy)  Activity Tolerance: Patient tolerated treatment well Patient left: in chair;with call bell/phone within reach;with chair alarm set  OT Visit Diagnosis: Unsteadiness on feet (R26.81);Other abnormalities of gait and mobility (R26.89);Muscle weakness (generalized) (M62.81);Hemiplegia and hemiparesis Hemiplegia - Right/Left: Right Hemiplegia - dominant/non-dominant: Dominant Hemiplegia - caused by: Cerebral infarction  Time: 6825-7493 OT Time Calculation (min): 34 min Charges:  OT General  Charges $OT Visit: 1 Visit OT Evaluation $OT Eval Moderate Complexity: 1 Mod  [place] Circle, OTR/L Acute [employer] Pager [phone] Office [phone]     Almon Register 02/19/2020, 1:08 PM

## 2020-02-19 NOTE — Discharge Instructions (Signed)
 Hospital Discharge After a Stroke  Being discharged from the hospital after a stroke can feel overwhelming. Many things may be different, and it is normal to feel scared or anxious. Some stroke survivors may be able to return to their homes, and others may need more specialized care on a temporary or permanent basis. Your stroke care team will work with you to develop a discharge plan that is best for you. Ask questions if you do not understand something. Invite a friend or family member to participate in discharge planning. Understanding and following your discharge plan can help to prevent another stroke or other problems. Understanding your medicines After a stroke, your health care provider may prescribe one or more types of medicine. It is important to take medicines exactly as told by your health care provider. Serious harm, such as another stroke, can happen if you are unable to take your medicine exactly as prescribed. Make sure you understand:  What medicine to take.  Why you are taking the medicine.  How and when to take it.  If it can be taken with your other medicines and herbal supplements.  Possible side effects.  When to call your health care provider if you have any side effects.  How you will get and pay for your medicines. Medical assistance programs may be able to help you pay for prescription medicines if you cannot afford them. If you are taking an anticoagulant, be sure to take it exactly as told by your health care provider. This type of medicine can increase the risk of bleeding because it works to prevent blood from clotting. You may need to take certain precautions to prevent bleeding. You should contact your health care provider if you have:  Bleeding or bruising.  A fall or other injury to your head.  Blood in your urine or stool (feces). Planning for home safety  Take steps to prevent falls, such as installing grab bars or using a shower chair. Ask a  friend or family member to get needed things in place before you go home if possible. A therapist can come to your home to make recommendations for safety equipment. Ask your health care provider if you would benefit from this service or from home care. Getting needed equipment Ask your health care provider for a list of any medical equipment and supplies you will need at home. These may include items such as:  Walkers.  Canes.  Wheelchairs.  Hand-strengthening devices.  Special eating utensils. Medical equipment can be rented or purchased, depending on your insurance coverage. Check with your insurance company about what is covered. Keeping follow-up visits After a stroke, you will need to follow up regularly with a health care provider. You may also need rehabilitation, which can include physical therapy, occupational therapy, or speech-language therapy. Keeping these appointments is very important to your recovery after a stroke. Be sure to bring your medicine list and discharge papers with you to your appointments. If you need help to keep track of your schedule, use a calendar or appointment reminder. Preventing another stroke Having a stroke puts you at risk for another stroke in the future. Ask your health care provider what actions you can take to lower the risk. These may include:  Increasing how much you exercise.  Making a healthy eating plan.  Quitting smoking.  Managing other health conditions, such as high blood pressure, high cholesterol, or diabetes.  Limiting alcohol use. Knowing the warning signs of a stroke  Make   sure you understand the signs of a stroke. Before you leave the hospital, you will receive information outlining the stroke warning signs. Share these with your friends and family members. "BE FAST" is an easy way to remember the main warning signs of a stroke:  B - Balance. Signs are dizziness, sudden trouble walking, or loss of balance.  E - Eyes.  Signs are trouble seeing or a sudden change in vision.  F - Face. Signs are sudden weakness or numbness of the face, or the face or eyelid drooping on one side.  A - Arms. Signs are weakness or numbness in an arm. This happens suddenly and usually on one side of the body.  S - Speech. Signs are sudden trouble speaking, slurred speech, or trouble understanding what people say.  T - Time. Time to call emergency services. Write down what time symptoms started. Other signs of stroke may include:  A sudden, severe headache with no known cause.  Nausea or vomiting.  Seizure. These symptoms may represent a serious problem that is an emergency. Do not wait to see if the symptoms will go away. Get medical help right away. Call your local emergency services (911 in the [place] Do not drive yourself to the hospital. Make note of the time that you had your first symptoms. Your emergency responders or emergency room staff will need to know this information. Summary  Being discharged from the hospital after a stroke can feel overwhelming. It is normal to feel scared or anxious.  Make sure you take medicines exactly as told by your health care provider.  Know the warning signs of a stroke, and get help right way if you have any of these symptoms. "BE FAST" is an easy way to remember the main warning signs of a stroke. This information is not intended to replace advice given to you by your health care provider. Make sure you discuss any questions you have with your health care provider. Document Revised: 01/15/2019 Document Reviewed: 07/28/2016 Elsevier Patient Education  2020 [employer]  

## 2020-02-19 NOTE — Progress Notes (Signed)
Inpatient Rehab Admissions Coordinator:   I spoke with Pt. Briefly during AM therapy session. Therapists report good progress and motivation. I am awaiting insurance authorization for this pt.   Clemens Catholic, [place], [place] Admissions Coordinator  [phone] ([place]) [phone] (office)

## 2020-02-20 ENCOUNTER — Encounter (HOSPITAL_COMMUNITY): Payer: Self-pay | Admitting: Physical Medicine & Rehabilitation

## 2020-02-20 ENCOUNTER — Inpatient Hospital Stay (HOSPITAL_COMMUNITY)
Admission: RE | Admit: 2020-02-20 | Discharge: 2020-03-11 | DRG: 057 | Disposition: A | Discharge status: Home / Self Care | Payer: Medicare PPO | Source: Intra-hospital | Attending: Physical Medicine & Rehabilitation | Admitting: Physical Medicine & Rehabilitation

## 2020-02-20 DIAGNOSIS — Z902 Acquired absence of lung [part of]: Secondary | ICD-10-CM | POA: Diagnosis not present

## 2020-02-20 DIAGNOSIS — F419 Anxiety disorder, unspecified: Secondary | ICD-10-CM | POA: Diagnosis present

## 2020-02-20 DIAGNOSIS — Z823 Family history of stroke: Secondary | ICD-10-CM | POA: Diagnosis not present

## 2020-02-20 DIAGNOSIS — Z87891 Personal history of nicotine dependence: Secondary | ICD-10-CM

## 2020-02-20 DIAGNOSIS — H9201 Otalgia, right ear: Secondary | ICD-10-CM | POA: Diagnosis not present

## 2020-02-20 DIAGNOSIS — Z79899 Other long term (current) drug therapy: Secondary | ICD-10-CM

## 2020-02-20 DIAGNOSIS — K219 Gastro-esophageal reflux disease without esophagitis: Secondary | ICD-10-CM | POA: Diagnosis present

## 2020-02-20 DIAGNOSIS — E785 Hyperlipidemia, unspecified: Secondary | ICD-10-CM | POA: Diagnosis present

## 2020-02-20 DIAGNOSIS — R531 Weakness: Secondary | ICD-10-CM | POA: Diagnosis not present

## 2020-02-20 DIAGNOSIS — I639 Cerebral infarction, unspecified: Secondary | ICD-10-CM

## 2020-02-20 DIAGNOSIS — Z8249 Family history of ischemic heart disease and other diseases of the circulatory system: Secondary | ICD-10-CM

## 2020-02-20 DIAGNOSIS — Z9079 Acquired absence of other genital organ(s): Secondary | ICD-10-CM

## 2020-02-20 DIAGNOSIS — I69351 Hemiplegia and hemiparesis following cerebral infarction affecting right dominant side: Principal | ICD-10-CM

## 2020-02-20 DIAGNOSIS — I69322 Dysarthria following cerebral infarction: Secondary | ICD-10-CM | POA: Diagnosis not present

## 2020-02-20 DIAGNOSIS — N179 Acute kidney failure, unspecified: Secondary | ICD-10-CM

## 2020-02-20 DIAGNOSIS — G629 Polyneuropathy, unspecified: Secondary | ICD-10-CM | POA: Diagnosis present

## 2020-02-20 DIAGNOSIS — Z7902 Long term (current) use of antithrombotics/antiplatelets: Secondary | ICD-10-CM

## 2020-02-20 DIAGNOSIS — Z7982 Long term (current) use of aspirin: Secondary | ICD-10-CM | POA: Diagnosis not present

## 2020-02-20 DIAGNOSIS — C3492 Malignant neoplasm of unspecified part of left bronchus or lung: Secondary | ICD-10-CM | POA: Diagnosis present

## 2020-02-20 DIAGNOSIS — I10 Essential (primary) hypertension: Secondary | ICD-10-CM | POA: Diagnosis present

## 2020-02-20 DIAGNOSIS — N400 Enlarged prostate without lower urinary tract symptoms: Secondary | ICD-10-CM | POA: Diagnosis present

## 2020-02-20 DIAGNOSIS — R252 Cramp and spasm: Secondary | ICD-10-CM | POA: Diagnosis not present

## 2020-02-20 LAB — CBC WITH DIFFERENTIAL/PLATELET
Abs Immature Granulocytes: 0.05 10*3/uL (ref 0.00–0.07)
Basophils Absolute: 0.1 10*3/uL (ref 0.0–0.1)
Basophils Relative: 1 %
Eosinophils Absolute: 0.4 10*3/uL (ref 0.0–0.5)
Eosinophils Relative: 5 %
HCT: 49 % (ref 39.0–52.0)
Hemoglobin: 16.5 g/dL (ref 13.0–17.0)
Immature Granulocytes: 1 %
Lymphocytes Relative: 24 %
Lymphs Abs: 1.8 10*3/uL (ref 0.7–4.0)
MCH: 30.5 pg (ref 26.0–34.0)
MCHC: 33.7 g/dL (ref 30.0–36.0)
MCV: 90.6 fL (ref 80.0–100.0)
Monocytes Absolute: 0.7 10*3/uL (ref 0.1–1.0)
Monocytes Relative: 10 %
Neutro Abs: 4.5 10*3/uL (ref 1.7–7.7)
Neutrophils Relative %: 59 %
Platelets: 169 10*3/uL (ref 150–400)
RBC: 5.41 MIL/uL (ref 4.22–5.81)
RDW: 12.2 % (ref 11.5–15.5)
WBC: 7.5 10*3/uL (ref 4.0–10.5)
nRBC: 0 % (ref 0.0–0.2)

## 2020-02-20 LAB — BASIC METABOLIC PANEL
Anion gap: 9 (ref 5–15)
BUN: 13 mg/dL (ref 8–23)
CO2: 20 mmol/L — ABNORMAL LOW (ref 22–32)
Calcium: 9.1 mg/dL (ref 8.9–10.3)
Chloride: 109 mmol/L (ref 98–111)
Creatinine, Ser: 1.04 mg/dL (ref 0.61–1.24)
GFR, Estimated: >60 mL/min (ref >60)
Glucose, Bld: 117 mg/dL — ABNORMAL HIGH (ref 70–99)
Potassium: 3.9 mmol/L (ref 3.5–5.1)
Sodium: 138 mmol/L (ref 135–145)

## 2020-02-20 LAB — MAGNESIUM: Magnesium: 2 mg/dL (ref 1.7–2.4)

## 2020-02-20 MED ORDER — ATENOLOL 25 MG PO TABS
50.0000 mg | ORAL_TABLET | Freq: Every day | ORAL | Status: DC
  Administered 2020-02-21 – 2020-03-11 (×20): 50 mg via ORAL
  Filled 2020-02-20 (×21): qty 2

## 2020-02-20 MED ORDER — ACETAMINOPHEN 650 MG RE SUPP: 650.0000 mg | RECTAL | Status: DC | PRN

## 2020-02-20 MED ORDER — LOSARTAN POTASSIUM 50 MG PO TABS
25.0000 mg | ORAL_TABLET | Freq: Every day | ORAL | Status: DC
  Administered 2020-02-21: 25 mg via ORAL
  Filled 2020-02-20: qty 1

## 2020-02-20 MED ORDER — CLOPIDOGREL BISULFATE 75 MG PO TABS
75.0000 mg | ORAL_TABLET | Freq: Every day | ORAL | Status: DC
  Administered 2020-02-21 – 2020-03-11 (×20): 75 mg via ORAL
  Filled 2020-02-20 (×20): qty 1

## 2020-02-20 MED ORDER — CLOPIDOGREL BISULFATE 75 MG PO TABS
75.0000 mg | ORAL_TABLET | Freq: Every day | ORAL | 0 refills | Status: DC
Start: 2020-02-20 — End: 2020-03-10

## 2020-02-20 MED ORDER — ASPIRIN EC 81 MG PO TBEC
81.0000 mg | DELAYED_RELEASE_TABLET | Freq: Every day | ORAL | Status: AC
  Administered 2020-02-21 – 2020-03-06 (×15): 81 mg via ORAL
  Filled 2020-02-20 (×15): qty 1

## 2020-02-20 MED ORDER — ACETAMINOPHEN 325 MG PO TABS: 650.0000 mg | ORAL_TABLET | ORAL | Status: DC | PRN

## 2020-02-20 MED ORDER — ASPIRIN 81 MG PO TABS: 81.0000 mg | ORAL_TABLET | Freq: Every day | ORAL | 0 refills | Status: DC

## 2020-02-20 MED ORDER — SENNOSIDES-DOCUSATE SODIUM 8.6-50 MG PO TABS: 1.0000 | ORAL_TABLET | Freq: Every evening | ORAL | Status: DC | PRN

## 2020-02-20 MED ORDER — CLOPIDOGREL BISULFATE 75 MG PO TABS: 75.0000 mg | ORAL_TABLET | Freq: Every day | ORAL | Status: DC

## 2020-02-20 MED ORDER — ATORVASTATIN CALCIUM 10 MG PO TABS
20.0000 mg | ORAL_TABLET | Freq: Every day | ORAL | Status: DC
  Administered 2020-02-21 – 2020-03-11 (×20): 20 mg via ORAL
  Filled 2020-02-20 (×20): qty 2

## 2020-02-20 MED ORDER — LOSARTAN POTASSIUM 25 MG PO TABS
25.0000 mg | ORAL_TABLET | Freq: Every day | ORAL | Status: DC
  Administered 2020-02-20: 25 mg via ORAL
  Filled 2020-02-20: qty 1

## 2020-02-20 MED ORDER — ATORVASTATIN CALCIUM 20 MG PO TABS
20.0000 mg | ORAL_TABLET | Freq: Every day | ORAL | 0 refills | Status: DC
Start: 2020-02-20 — End: 2020-03-10

## 2020-02-20 MED ORDER — ACETAMINOPHEN 160 MG/5ML PO SOLN: 650.0000 mg | ORAL | Status: DC | PRN

## 2020-02-20 MED ORDER — ATENOLOL 25 MG PO TABS
50.0000 mg | ORAL_TABLET | Freq: Every day | ORAL | Status: DC
  Administered 2020-02-20: 50 mg via ORAL
  Filled 2020-02-20: qty 2

## 2020-02-20 NOTE — Progress Notes (Signed)
Patient admitted to 4W05, [facility]. Patient appears slert and denies pain.

## 2020-02-20 NOTE — Progress Notes (Signed)
Physical Therapy Treatment Patient Details Name: Hector [name] MRN: [id number] DOB: 06-06-1940 Today's Date: 02/20/2020    History of Present Illness 79yo male prsenting with R sided weakness and speech slurring/word finding difficulty. 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 No tpa given. PMH lung CA, HTN, lobectomy    PT Comments    Patient progressing well towards PT goals. Initiated gait training holding onto back of recliner today with Mod A for support. Pt with an ataxic like gait, difficulty progressing RLE and needed assist to prevent right knee from buckling. Worked on marching in place with UE support and importance of WB through RUE/LE during transitions. Highly motivated. Able to brush teeth with some assist to close tooth paste using left hand. Will continue to follow  Follow Up Recommendations  CIR     Equipment Recommendations  Other (comment) (defer)    Recommendations for Other Services       Precautions / Restrictions Precautions Precautions: Fall Precaution Comments: R sided weakness, chronic peripheral neuropathy Restrictions Weight Bearing Restrictions: No    Mobility  Bed Mobility Overal bed mobility: Needs Assistance Bed Mobility: Rolling;Sidelying to Sit Rolling: Min guard Sidelying to sit: Min assist;HOB elevated   Sit to supine: Min guard   General bed mobility comments: Use of rail to get to EOB, Min A for trunk stability. Able to return self to supine bringing LEs into bed with effort.  Transfers Overall transfer level: Needs assistance Equipment used:  (back of recliner chair) Transfers: Sit to/from Stand Sit to Stand: Min assist         General transfer comment: Assist to power to standing with cues for proper foot placement and right hand on knee to elicit WB during transition. Stood from EOB x3.  Ambulation/Gait Ambulation/Gait  assistance: Mod assist;+2 safety/equipment Gait Distance (Feet): 4 Feet Assistive device:  (back of recliner) Gait Pattern/deviations: Step-through pattern;Decreased step length - left;Decreased stance time - right;Narrow base of support;Decreased weight shift to right Gait velocity: decreased   General Gait Details: Slow, ataxic like gait with difficulty coordinating RLE, assist to stabilize right knee during stance and assist with trunk control.   Stairs             Wheelchair Mobility    Modified Rankin (Stroke Patients Only) Modified Rankin (Stroke Patients Only) Pre-Morbid Rankin Score: No symptoms Modified Rankin: Moderately severe disability     Balance Overall balance assessment: Needs assistance Sitting-balance support: Feet supported;Single extremity supported Sitting balance-Leahy Scale: Fair Sitting balance - Comments: supervision for safety   Standing balance support: During functional activity Standing balance-Leahy Scale: Poor Standing balance comment: reliant on external support. Worked on marching in place leaning on recliner chair; assist with supporting right knee during stance for buckling.                            Cognition Arousal/Alertness: Awake/alert Behavior During Therapy: WFL for tasks assessed/performed Overall Cognitive Status: Within Functional Limits for tasks assessed                                        Exercises      General Comments        Pertinent Vitals/Pain Pain Assessment: No/denies pain    Home Living  Prior Function            PT Goals (current goals can now be found in the care plan section) Progress towards PT goals: Progressing toward goals    Frequency    Min 4X/week      PT Plan Current plan remains appropriate    Co-evaluation              AM-PAC PT "6 Clicks" Mobility   Outcome Measure  Help needed turning from your back to  your side while in a flat bed without using bedrails?: A Little Help needed moving from lying on your back to sitting on the side of a flat bed without using bedrails?: A Little Help needed moving to and from a bed to a chair (including a wheelchair)?: A Lot Help needed standing up from a chair using your arms (e.g., wheelchair or bedside chair)?: A Little Help needed to walk in hospital room?: A Lot Help needed climbing 3-5 steps with a railing? : Total 6 Click Score: 14    End of Session Equipment Utilized During Treatment: Gait belt Activity Tolerance: Patient tolerated treatment well Patient left: in bed;with call bell/phone within reach;with bed alarm set;with SCD's reapplied Nurse Communication: Mobility status PT Visit Diagnosis: Unsteadiness on feet (R26.81);Difficulty in walking, not elsewhere classified (R26.2);Hemiplegia and hemiparesis;Other abnormalities of gait and mobility (R26.89) Hemiplegia - Right/Left: Right Hemiplegia - dominant/non-dominant: Dominant Hemiplegia - caused by: Cerebral infarction     Time: 8315-1761 PT Time Calculation (min) (ACUTE ONLY): 18 min  Charges:  $Therapeutic Activity: 8-22 mins                     Marisa Severin, PT, DPT Acute Rehabilitation Services Pager [phone] Office [place] 02/20/2020, 3:36 PM

## 2020-02-20 NOTE — Discharge Summary (Signed)
Physician Discharge Summary  Hector [name] WEX:[id number] DOB: 12/26/1940 DOA: 02/16/2020  PCP: Pcp, No  Admit date: 02/16/2020 Discharge date: 02/20/2020  Admitted From: Home Disposition: CIR  Recommendations for Outpatient Follow-up:  1. Follow up with PCP in 1-2 weeks 2. Follow-up with neurology in 4 to 6 weeks 3. Please obtain BMP/CBC in one week 4. Please follow up with your PCP on the following pending results: Unresulted Labs (From admission, onward)          Start     Ordered   02/18/20 0500  JAK2  [id number] Qual. with reflex to Exon 12  Tomorrow morning,   R        02/17/20 1458   02/18/20 9381  Basic metabolic panel  Daily,   R     Question:  Specimen collection method  Answer:  Lab=Lab collect   02/17/20 1930   02/18/20 0500  CBC with Differential/Platelet  Daily,   R     Question:  Specimen collection method  Answer:  Lab=Lab collect   02/17/20 1930   02/18/20 0500  Magnesium  Daily,   R     Question:  Specimen collection method  Answer:  Lab=Lab collect   02/17/20 1930   Signed and Held  Comprehensive metabolic panel  Tomorrow morning,   R       Question:  Specimen collection method  Answer:  Lab=Lab collect   Signed and Held   Signed and Held  CBC WITH DIFFERENTIAL  Tomorrow morning,   R       Question:  Specimen collection method  Answer:  Lab=Lab collect   Signed and Held           Home Health: None Equipment/Devices: None  Discharge Condition: Stable CODE STATUS: Full code Diet recommendation: Cardiac  Subjective: Seen and examined.  No family member present today.  He has no complaints.  Right sided weakness is improving.  Brief/Interim Summary: Hector [name] is a 79 yo CM with PMH atypical carcinoid tumor,, BPH s/p TURP, HTN, GERD, diverticulitis who initially presented to [facility] on Monday after he had developed right sided weakness over the weekend as well as a change in his speech.  He states that he last felt normal on Saturday and waking up on Sunday  morning but shortly after, he noted that his left face felt tingly and he then started to have difficulty with his speech. he also had right-sided weakness to the point of having an unstable gait.  Most of his symptoms resolved later on Sunday but again recurred on Monday and did not improve, thus he sought attention. He underwent imaging studies given the suspicion for stroke. 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 CTA head/neck was also negative for LVO or significant carotid stenosis. He was again noted to have chronic ischemic microangiopathy and generalized atrophy.He was transferred to [facility] for MRI brain and further stroke work-up.Of note, prior office notes dating back to 2017 revealed that the patient had complaints of paresthesias in his extremities and stocking glove distribution of numbness without pain. He describes sensations that felt like his feet were not touching the ground. He was on oral vitamin B12 already at that time.  After admission to [facility], he underwent MRI brain which showed small acute infarct of the left paramedian pons. No hemorrhage.  He was seen  by neurology.  He was already on aspirin and Plavix was added.  Lipid panel showed LDL of 77.  He was started on atorvastatin 20 mg.  He was seen by PT OT who recommended CIR. neurology cleared him for discharge.  His weakness on the right side is improving gradually every day.  He is being discharged in stable condition.  He is supposed to continue both aspirin and Plavix for total of 3 weeks and then stop aspirin but continue Plavix.  Discharge Diagnoses:  Principal Problem:   Right sided weakness Active Problems:   Polycythemia   Essential hypertension   Acute ischemic stroke [facility])   Malignant neoplasm of left lung (HCC)   Cerebral thrombosis  with cerebral infarction   Benign prostatic hyperplasia   Prediabetes   Right hemiparesis ([place])   Dyslipidemia    Discharge Instructions   Allergies as of 02/20/2020   No Known Allergies     Medication List    TAKE these medications   acetaminophen 500 MG tablet Commonly known as: TYLENOL Take 500-1,000 mg by mouth every 6 (six) hours as needed for mild pain or headache.   aspirin 81 MG tablet Take 1 tablet (81 mg total) by mouth at bedtime for 21 days.   atenolol 50 MG tablet Commonly known as: TENORMIN TAKE ONE (1) TABLET EACH DAY What changed: See the new instructions.   atorvastatin 20 MG tablet Commonly known as: LIPITOR Take 1 tablet (20 mg total) by mouth daily.   clopidogrel 75 MG tablet Commonly known as: PLAVIX Take 1 tablet (75 mg total) by mouth daily.   diazepam 5 MG tablet Commonly known as: VALIUM Take 5 mg by mouth 2 (two) times daily as needed for anxiety.   fluticasone 50 MCG/ACT nasal spray Commonly known as: FLONASE Place 1-2 sprays into both nostrils daily as needed for allergies or rhinitis.   losartan 25 MG tablet Commonly known as: COZAAR Take 25 mg by mouth at bedtime.   sucralfate 1 g tablet Commonly known as: CARAFATE Take 1 g by mouth in the morning and at bedtime.   vitamin B-12 1000 MCG tablet Commonly known as: CYANOCOBALAMIN Take 1,000 mcg by mouth daily.       No Known Allergies  Consultations: Neurology   Procedures/Studies: CT Angio Head W or Wo Contrast  Result Date: 02/17/2020 CLINICAL DATA:  Stroke-like symptoms. Left facial numbness and dizziness. EXAM: CT ANGIOGRAPHY HEAD AND NECK TECHNIQUE: Multidetector CT imaging of the head and neck was performed using the standard protocol during bolus administration of intravenous contrast. Multiplanar CT image reconstructions and MIPs were obtained to evaluate the vascular anatomy. Carotid stenosis measurements (when applicable) are obtained utilizing NASCET criteria,  using the distal internal carotid diameter as the denominator. CONTRAST:  177mL OMNIPAQUE IOHEXOL 350 MG/ML SOLN COMPARISON:  None. FINDINGS: CT HEAD FINDINGS Brain: There is no mass, hemorrhage or extra-axial collection. There is generalized atrophy without lobar predilection. There is hypoattenuation of the periventricular white matter, most commonly indicating chronic ischemic microangiopathy. Skull: The visualized skull base, calvarium and extracranial soft tissues are normal. Sinuses/Orbits: No fluid levels or advanced mucosal thickening of the visualized paranasal sinuses. No mastoid or middle ear effusion. The orbits are normal. CTA NECK FINDINGS SKELETON: There is no bony spinal canal stenosis. No lytic or blastic lesion. OTHER NECK: Normal pharynx, larynx and major salivary glands. No cervical lymphadenopathy. Unremarkable thyroid gland. UPPER CHEST: No pneumothorax or pleural effusion. No nodules or masses. AORTIC ARCH: There is calcific  atherosclerosis of the aortic arch. There is no aneurysm, dissection or hemodynamically significant stenosis of the visualized portion of the aorta. Conventional 3 vessel aortic branching pattern. The visualized proximal subclavian arteries are widely patent. RIGHT CAROTID SYSTEM: Normal without aneurysm, dissection or stenosis. LEFT CAROTID SYSTEM: Normal without aneurysm, dissection or stenosis. VERTEBRAL ARTERIES: Left dominant configuration. Both origins are clearly patent. There is no dissection, occlusion or flow-limiting stenosis to the skull base (V1-V3 segments). CTA HEAD FINDINGS POSTERIOR CIRCULATION: --Vertebral arteries: Normal V4 segments. --Inferior cerebellar arteries: Normal. --Basilar artery: Normal. --Superior cerebellar arteries: Normal. --Posterior cerebral arteries (PCA): Normal. ANTERIOR CIRCULATION: --Intracranial internal carotid arteries: Atherosclerotic calcification of the internal carotid arteries at the skull base without hemodynamically  significant stenosis. --Anterior cerebral arteries (ACA): Normal. Both A1 segments are present. Patent anterior communicating artery (a-comm). --Middle cerebral arteries (MCA): Normal. VENOUS SINUSES: As permitted by contrast timing, patent. ANATOMIC VARIANTS: Fetal origin of the right posterior cerebral artery. Review of the MIP images confirms the above findings. IMPRESSION: 1. No emergent large vessel occlusion or hemodynamically significant stenosis of the head or neck. 2. Chronic ischemic microangiopathy and generalized atrophy. Aortic Atherosclerosis (ICD10-I70.0). Electronically Signed   By: Ulyses Jarred M.D.   On: 02/17/2020 03:48   CT Head Wo Contrast  Result Date: 02/16/2020 CLINICAL DATA:  Left facial burning and gait instability, slurred speech noted at 1200 hours EXAM: CT HEAD WITHOUT CONTRAST TECHNIQUE: Contiguous axial images were obtained from the base of the skull through the vertex without intravenous contrast. COMPARISON:  None. 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 No large CT evident area large vascular territory or cortically based infarction. No evidence of hemorrhage, hydrocephalus, extra-axial collection, visible mass lesion or mass effect. Symmetric prominence of the ventricles, cisterns and sulci compatible with parenchymal volume loss. Patchy areas of white matter hypoattenuation are most compatible with chronic microvascular angiopathy. Vascular: Atherosclerotic calcification of the carotid siphons and intradural vertebral arteries. No hyperdense vessel. Skull: No calvarial fracture or suspicious osseous lesion. No scalp swelling or hematoma. Sinuses/Orbits: Mild thickening in the paranasal sinuses, left greater than right. No air-fluid levels are pneumatized secretions. Mastoid air cells are predominantly clear with  pneumatization of the petrous apices. Included orbital structures are unremarkable. Other: None. IMPRESSION: 1. 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 2. No large CT evident area of large vascular territory or cortically based infarction or other acute intracranial abnormality. If there is persisting concern for acute infarction, MRI is more sensitive and specific for early features of ischemia. 3. Parenchymal volume loss and chronic microvascular angiopathy. 4. Intracranial atherosclerosis. Electronically Signed   By: Lovena Le M.D.   On: 02/16/2020 15:33   CT Angio Neck W and/or Wo Contrast  Result Date: 02/17/2020 CLINICAL DATA:  Stroke-like symptoms. Left facial numbness and dizziness. EXAM: CT ANGIOGRAPHY HEAD AND NECK TECHNIQUE: Multidetector CT imaging of the head and neck was performed using the standard protocol during bolus administration of intravenous contrast. Multiplanar CT image reconstructions and MIPs were obtained to evaluate the vascular anatomy. Carotid stenosis measurements (when applicable) are obtained utilizing NASCET criteria, using the distal internal carotid diameter as the denominator. CONTRAST:  16mL OMNIPAQUE IOHEXOL 350 MG/ML SOLN COMPARISON:  None. FINDINGS: CT HEAD FINDINGS Brain: There is no mass, hemorrhage or  extra-axial collection. There is generalized atrophy without lobar predilection. There is hypoattenuation of the periventricular white matter, most commonly indicating chronic ischemic microangiopathy. Skull: The visualized skull base, calvarium and extracranial soft tissues are normal. Sinuses/Orbits: No fluid levels or advanced mucosal thickening of the visualized paranasal sinuses. No mastoid or middle ear effusion. The orbits are normal. CTA NECK FINDINGS SKELETON: There is no bony spinal canal stenosis. No lytic or blastic  lesion. OTHER NECK: Normal pharynx, larynx and major salivary glands. No cervical lymphadenopathy. Unremarkable thyroid gland. UPPER CHEST: No pneumothorax or pleural effusion. No nodules or masses. AORTIC ARCH: There is calcific atherosclerosis of the aortic arch. There is no aneurysm, dissection or hemodynamically significant stenosis of the visualized portion of the aorta. Conventional 3 vessel aortic branching pattern. The visualized proximal subclavian arteries are widely patent. RIGHT CAROTID SYSTEM: Normal without aneurysm, dissection or stenosis. LEFT CAROTID SYSTEM: Normal without aneurysm, dissection or stenosis. VERTEBRAL ARTERIES: Left dominant configuration. Both origins are clearly patent. There is no dissection, occlusion or flow-limiting stenosis to the skull base (V1-V3 segments). CTA HEAD FINDINGS POSTERIOR CIRCULATION: --Vertebral arteries: Normal V4 segments. --Inferior cerebellar arteries: Normal. --Basilar artery: Normal. --Superior cerebellar arteries: Normal. --Posterior cerebral arteries (PCA): Normal. ANTERIOR CIRCULATION: --Intracranial internal carotid arteries: Atherosclerotic calcification of the internal carotid arteries at the skull base without hemodynamically significant stenosis. --Anterior cerebral arteries (ACA): Normal. Both A1 segments are present. Patent anterior communicating artery (a-comm). --Middle cerebral arteries (MCA): Normal. VENOUS SINUSES: As permitted by contrast timing, patent. ANATOMIC VARIANTS: Fetal origin of the right posterior cerebral artery. Review of the MIP images confirms the above findings. IMPRESSION: 1. No emergent large vessel occlusion or hemodynamically significant stenosis of the head or neck. 2. Chronic ischemic microangiopathy and generalized atrophy. Aortic Atherosclerosis (ICD10-I70.0). Electronically Signed   By: Ulyses Jarred M.D.   On: 02/17/2020 03:48   MR BRAIN WO CONTRAST  Result Date: 02/17/2020 CLINICAL DATA:  Right-sided weakness  EXAM: MRI HEAD WITHOUT CONTRAST TECHNIQUE: Multiplanar, multiecho pulse sequences of the brain and surrounding structures were obtained without intravenous contrast. COMPARISON:  None. 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 No acute hemorrhage. Multifocal hyperintense T2-weighted signal within the white matter. There is generalized atrophy without lobar predilection. No chronic microhemorrhage. Normal midline structures. Vascular: Normal flow voids. Skull and upper cervical spine: Normal marrow signal. Sinuses/Orbits: Negative. Other: None. 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 No hemorrhage or mass effect. Electronically Signed   By: Ulyses Jarred M.D.   On: 02/17/2020 19:08   DG Chest Portable 1 View  Result Date: 02/16/2020 CLINICAL DATA:  Dizziness EXAM: PORTABLE CHEST 1 VIEW COMPARISON:  None. FINDINGS: The heart size and mediastinal contours are within normal limits. Both lungs are clear. The visualized skeletal structures are unremarkable. IMPRESSION: No active disease. Electronically Signed   By: Inez Catalina M.D.   On: 02/16/2020 15:45   DG Swallowing Func-Speech Pathology  Result Date: 02/18/2020 Objective Swallowing Evaluation: Type of Study: MBS-Modified Barium Swallow Study  Patient Details Name: Hector [name] MRN: [id number] Date of Birth: [birth year] Today's Date: 02/18/2020 Time: SLP Start Time (ACUTE ONLY): 1106 -SLP Stop Time (ACUTE ONLY): 1113 SLP Time Calculation (min) (ACUTE ONLY): 7 min Past Medical History: Past Medical History: Diagnosis Date . Diverticulitis 04/2012  admission [facility] . Former smoker   Smoked for 20 yrs;  Quit  25 yrs ago. Marland Kitchen GERD (gastroesophageal reflux disease)  . Hypertension  . Hypokalemia  .  Lung cancer ([place])  . Personal history of colonic adenoma 04/2009 . Polycythemia  . Prostatitis   S/P  TURP Past Surgical History: Past Surgical History: Procedure Laterality Date . APPENDECTOMY  02/25/11  . COLONOSCOPY   . LOBECTOMY   . TRANSURETHRAL RESECTION OF PROSTATE  2004 HPI: 79 yo male adm to [facility] with right sided weakness, also has polycythemia, lung cancer hx s/p VATS and lobectomy, colonic adenoma, insomnia, HTN.  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  Speech eval ordered.  Subjective: Pt awake, alert, pleasant, participative.  Daughter present for evaluation Assessment / Plan / Recommendation CHL IP CLINICAL IMPRESSIONS 02/18/2020 Clinical Impression Pt presents with grossly normal swallow function.  There was no penetration or aspiration with any consistencies trialed.  Oral phase was efficient and well controlled.  Swallow initiation was timely.  Recommend regular texture diet with thin liquids. No further ST needs for dysphagia.  SLP will sign off for swallowing. SLP Visit Diagnosis Dysphagia, unspecified (R13.10) Attention and concentration deficit following -- Frontal lobe and executive function deficit following -- Impact on safety and function No limitations   CHL IP TREATMENT RECOMMENDATION 02/18/2020 Treatment Recommendations No treatment recommended at this time   No flowsheet data found. CHL IP DIET RECOMMENDATION 02/18/2020 SLP Diet Recommendations Regular solids;Thin liquid Liquid Administration via Cup;Straw Medication Administration Whole meds with liquid Compensations Slow rate;Small sips/bites Postural Changes Seated upright at 90 degrees   CHL IP OTHER RECOMMENDATIONS 02/18/2020 Recommended Consults -- Oral Care Recommendations Oral care BID Other Recommendations --   CHL IP FOLLOW UP RECOMMENDATIONS 02/18/2020 Follow up Recommendations None   CHL IP FREQUENCY AND DURATION 02/18/2020 Speech Therapy Frequency (ACUTE ONLY) (No Data) Treatment Duration --      CHL IP ORAL PHASE 02/18/2020 Oral Phase WFL Oral - Pudding Teaspoon -- Oral - Pudding Cup -- Oral - Honey Teaspoon -- Oral - Honey Cup -- Oral - Nectar Teaspoon -- Oral -  Nectar Cup -- Oral - Nectar Straw -- Oral - Thin Teaspoon -- Oral - Thin Cup WFL Oral - Thin Straw WFL Oral - Puree WFL Oral - Mech Soft -- Oral - Regular WFL Oral - Multi-Consistency -- Oral - Pill WFL Oral Phase - Comment --  CHL IP PHARYNGEAL PHASE 02/18/2020 Pharyngeal Phase WFL Pharyngeal- Pudding Teaspoon -- Pharyngeal -- Pharyngeal- Pudding Cup -- Pharyngeal -- Pharyngeal- Honey Teaspoon -- Pharyngeal -- Pharyngeal- Honey Cup -- Pharyngeal -- Pharyngeal- Nectar Teaspoon -- Pharyngeal -- Pharyngeal- Nectar Cup -- Pharyngeal -- Pharyngeal- Nectar Straw -- Pharyngeal -- Pharyngeal- Thin Teaspoon -- Pharyngeal -- Pharyngeal- Thin Cup [facility] Pharyngeal Material does not enter airway Pharyngeal- Thin Straw WFL Pharyngeal Material does not enter airway Pharyngeal- Puree WFL Pharyngeal Material does not enter airway Pharyngeal- Mechanical Soft -- Pharyngeal -- Pharyngeal- Regular WFL Pharyngeal Material does not enter airway Pharyngeal- Multi-consistency -- Pharyngeal -- Pharyngeal- Pill WFL Pharyngeal Material does not enter airway Pharyngeal Comment --  CHL IP CERVICAL ESOPHAGEAL PHASE 02/18/2020 Cervical Esophageal Phase WFL Pudding Teaspoon -- Pudding Cup -- Honey Teaspoon -- Honey Cup -- Nectar Teaspoon -- Nectar Cup -- Nectar Straw -- Thin Teaspoon -- Thin Cup -- Thin Straw -- Puree -- Mechanical Soft -- Regular -- Multi-consistency -- Pill -- Cervical Esophageal Comment -- Celedonio Savage, MA, [place] Office: [phone] 02/18/2020, 12:09 PM              ECHOCARDIOGRAM COMPLETE  Result Date: 02/17/2020  ECHOCARDIOGRAM REPORT   Patient Name:   Hector [name] Date of Exam: 02/17/2020 Medical Rec #:  [id number]      Height:       71.0 in Accession #:    [id number]     Weight:       200.0 lb Date of Birth:  July 16, 1940      BSA:          2.109 m Patient Age:    49 years       BP:           137/89 mmHg Patient Gender: M              HR:           68 bpm. Exam Location:  Inpatient  Procedure: 2D Echo Indications:    right sided weakness  History:        Patient has no prior history of Echocardiogram examinations.                 Risk Factors:Former Smoker. Lung cancer.  Sonographer:    Jannett Celestine RDCS (AE) Referring Phys: [place]  Sonographer Comments: limited mobility due to right sided weakness. no true parasternal window IMPRESSIONS  1. Left ventricular ejection fraction, by estimation, is 60 to 65%. The left ventricle has normal function. The left ventricle has no regional wall motion abnormalities. Left ventricular diastolic parameters were normal.  2. Right ventricular systolic function is normal. The right ventricular size is normal.  3. The mitral valve is normal in structure. No evidence of mitral valve regurgitation. No evidence of mitral stenosis.  4. The aortic valve is normal in structure. Aortic valve regurgitation is not visualized. No aortic stenosis is present.  5. The inferior vena cava is normal in size with greater than 50% respiratory variability, suggesting right atrial pressure of 3 mmHg. FINDINGS  Left Ventricle: Left ventricular ejection fraction, by estimation, is 60 to 65%. The left ventricle has normal function. The left ventricle has no regional wall motion abnormalities. The left ventricular internal cavity size was normal in size. There is  no left ventricular hypertrophy. Left ventricular diastolic parameters were normal. Normal left ventricular filling pressure. Right Ventricle: The right ventricular size is normal. No increase in right ventricular wall thickness. Right ventricular systolic function is normal. Left Atrium: Left atrial size was normal in size. Right Atrium: Right atrial size was normal in size. Pericardium: There is no evidence of pericardial effusion. Mitral Valve: The mitral valve is normal in structure. No evidence of mitral valve regurgitation. No evidence of mitral valve stenosis. Tricuspid Valve: The tricuspid valve is normal  in structure. Tricuspid valve regurgitation is not demonstrated. No evidence of tricuspid stenosis. Aortic Valve: The aortic valve is normal in structure. Aortic valve regurgitation is not visualized. No aortic stenosis is present. Pulmonic Valve: The pulmonic valve was not well visualized. Pulmonic valve regurgitation is not visualized. No evidence of pulmonic stenosis. Aorta: The aortic root is normal in size and structure. Venous: The inferior vena cava is normal in size with greater than 50% respiratory variability, suggesting right atrial pressure of 3 mmHg. IAS/Shunts: No atrial level shunt detected by color flow Doppler.  LEFT VENTRICLE PLAX 2D LVIDd:         3.50 cm  Diastology LVIDs:         2.20 cm  LV e' medial:    6.64 cm/s LV PW:         0.80 cm  LV E/e' medial:  8.6 LV IVS:        0.90 cm  LV e' lateral:   9.46 cm/s LVOT diam:     2.40 cm  LV E/e' lateral: 6.1 LV SV:         76 LV SV Index:   36 LVOT Area:     4.52 cm  RIGHT VENTRICLE RV S prime:     10.60 cm/s TAPSE (M-mode): 1.6 cm LEFT ATRIUM         Index LA diam:    3.90 cm 1.85 cm/m  AORTIC VALVE LVOT Vmax:   82.90 cm/s LVOT Vmean:  63.100 cm/s LVOT VTI:    0.168 m  AORTA Ao Root diam: 3.10 cm MITRAL VALVE MV Area (PHT): 2.97 cm    SHUNTS MV Decel Time: 255 msec    Systemic VTI:  0.17 m MV E velocity: 57.40 cm/s  Systemic Diam: 2.40 cm MV A velocity: 58.30 cm/s MV E/A ratio:  0.98 Mihai Croitoru MD Electronically signed by Sanda Klein MD Signature Date/Time: 02/17/2020/2:45:59 PM    Final      Discharge Exam: Vitals:   02/20/20 0742 02/20/20 1128  BP: (!) 167/90 (!) 146/83  Pulse: 76 62  Resp: 18 18  Temp: 97.9 F (36.6 C) 98.3 F (36.8 C)  SpO2: 96% 96%   Vitals:   02/19/20 2342 02/20/20 0401 02/20/20 0742 02/20/20 1128  BP: (!) 150/80 (!) 181/94 (!) 167/90 (!) 146/83  Pulse: 64 75 76 62  Resp: 18 17 18 18   Temp: 98.2 F (36.8 C) 97.7 F (36.5 C) 97.9 F (36.6 C) 98.3 F (36.8 C)  TempSrc: Oral  Oral Oral  SpO2:  97% 98% 96% 96%  Weight:      Height:        General: Pt is alert, awake, not in acute distress Cardiovascular: RRR, S1/S2 +, no rubs, no gallops Respiratory: CTA bilaterally, no wheezing, no rhonchi Abdominal: Soft, NT, ND, bowel sounds + Extremities: no edema, no cyanosis Neuro: Alert and oriented.  4/5 power in right upper and lower extremity.  Cranial nerves II to XII intact.  Speech fluent and normal.  Strength normal on the left side of the body.   The results of significant diagnostics from this hospitalization (including imaging, microbiology, ancillary and laboratory) are listed below for reference.     Microbiology: Recent Results (from the past 240 hour(s))  Respiratory Panel by RT PCR (Flu A&B, Covid) - Nasopharyngeal Swab     Status: None   Collection Time: 02/16/20  3:29 PM   Specimen: Nasopharyngeal Swab  Result Value Ref Range Status   SARS Coronavirus 2 by RT PCR NEGATIVE NEGATIVE Final    Comment: (NOTE) SARS-CoV-2 target nucleic acids are NOT DETECTED.  The SARS-CoV-2 RNA is generally detectable in upper respiratoy specimens during the acute phase of infection. The lowest concentration of SARS-CoV-2 viral copies this assay can detect is 131 copies/mL. A negative result does not preclude SARS-Cov-2 infection and should not be used as the sole basis for treatment or other patient management decisions. A negative result may occur with  improper specimen collection/handling, submission of specimen other than nasopharyngeal swab, presence of viral mutation(s) within the areas targeted by this assay, and inadequate number of viral copies (<131 copies/mL). A negative result must be combined with clinical observations, patient history, and epidemiological information. The expected result is Negative.  Fact Sheet for Patients:  PinkCheek.be  Fact Sheet for Healthcare Providers:  GravelBags.it  This test is  no t yet approved or cleared by the [place] and  has been authorized for detection and/or diagnosis of SARS-CoV-2 by FDA under an Emergency Use Authorization (EUA). This EUA will remain  in effect (meaning this test can be used) for the duration of the COVID-19 declaration under Section 564(b)(1) of the Act, 21 U.S.C. section 360bbb-3(b)(1), unless the authorization is terminated or revoked sooner.     Influenza A by PCR NEGATIVE NEGATIVE Final   Influenza B by PCR NEGATIVE NEGATIVE Final    Comment: (NOTE) The Xpert Xpress SARS-CoV-2/FLU/RSV assay is intended as an aid in  the diagnosis of influenza from Nasopharyngeal swab specimens and  should not be used as a sole basis for treatment. Nasal washings and  aspirates are unacceptable for Xpert Xpress SARS-CoV-2/FLU/RSV  testing.  Fact Sheet for Patients: PinkCheek.be  Fact Sheet for Healthcare Providers: GravelBags.it  This test is not yet approved or cleared by the [place] FDA and  has been authorized for detection and/or diagnosis of SARS-CoV-2 by  FDA under an Emergency Use Authorization (EUA). This EUA will remain  in effect (meaning this test can be used) for the duration of the  Covid-19 declaration under Section 564(b)(1) of the Act, 21  U.S.C. section 360bbb-3(b)(1), unless the authorization is  terminated or revoked. Performed at [facility], [address] [place], [place] [address]   Urine culture     Status: None   Collection Time: 02/16/20  3:29 PM   Specimen: Urine, Clean Catch  Result Value Ref Range Status   Specimen Description   Final    URINE, CLEAN CATCH Performed at [facility], [address] [place], [place] [address]    Special Requests   Final    Normal Performed at [facility], [address] [place], [place] [address]    Culture   Final    NO GROWTH Performed at [place], [place] [address] [place], [place] [address]    Report Status 02/18/2020 FINAL  Final     Labs: BNP (last 3 results) No results for input(s): BNP in the last 8760 hours. Basic Metabolic Panel: Recent Labs  Lab 02/16/20 1458 02/17/20 1236 02/18/20 0124 02/19/20 0633 02/20/20 0104  NA 137 138 139 137 138  K 3.7 4.1 3.9 3.7 3.9  CL 101 103 104 107 109  CO2 27 26 26 23  20*  GLUCOSE 152* 139* 123* 122* 117*  BUN 15 12 11 10 13   CREATININE 1.20 1.19 1.07 1.02 1.04  CALCIUM 9.6 9.5 9.2 8.9 9.1  MG  --   --  1.9 2.0 2.0   Liver Function Tests: Recent Labs  Lab 02/16/20 1458 02/17/20 1236  AST 23 22  ALT 26 29  ALKPHOS 55 48  BILITOT 0.6 0.9  PROT 7.2 6.6  ALBUMIN 4.3 4.0   No results for input(s): LIPASE, AMYLASE in the last 168 hours. No results for input(s): AMMONIA in the last 168 hours. CBC: Recent Labs  Lab 02/16/20 1458 02/17/20 1236 02/18/20 0124 02/19/20 0633 02/20/20 0104  WBC 7.8 7.0 8.3 6.3 7.5  NEUTROABS 4.9  --  5.7 3.9 4.5  HGB 17.5* 17.0 16.2 16.2 16.5  HCT 51.6 50.0 47.8 47.8 49.0  MCV 93.5 91.7 91.9 90.9 90.6  PLT 217 181 187 179 169   Cardiac Enzymes: No results for input(s): CKTOTAL, CKMB, CKMBINDEX, TROPONINI in the last 168 hours.  BNP: Invalid input(s): POCBNP CBG: No results for input(s): GLUCAP in the last 168 hours. D-Dimer No results for input(s): DDIMER in the last 72 hours. Hgb A1c No results for input(s): HGBA1C in the last 72 hours. Lipid Profile No results for input(s): CHOL, HDL, LDLCALC, TRIG, CHOLHDL, LDLDIRECT in the last 72 hours. Thyroid function studies No results for input(s): TSH, T4TOTAL, T3FREE, THYROIDAB in the last 72 hours.  Invalid input(s): FREET3 Anemia work up Recent Labs    02/17/20 2040  VITAMINB12 514   Urinalysis    Component Value Date/Time   COLORURINE YELLOW 02/16/2020 [place] 02/16/2020 1529   APPEARANCEUR Clear 02/01/2016 1216   LABSPEC 1.010 02/16/2020 1529   PHURINE  6.0 02/16/2020 1529   GLUCOSEU NEGATIVE 02/16/2020 1529   HGBUR MODERATE (A) 02/16/2020 1529   BILIRUBINUR NEGATIVE 02/16/2020 1529   BILIRUBINUR Negative 02/01/2016 1216   [place] 02/16/2020 1529   PROTEINUR NEGATIVE 02/16/2020 1529   NITRITE NEGATIVE 02/16/2020 1529   LEUKOCYTESUR NEGATIVE 02/16/2020 1529   Sepsis Labs Invalid input(s): PROCALCITONIN,  WBC,  LACTICIDVEN Microbiology Recent Results (from the past 240 hour(s))  Respiratory Panel by RT PCR (Flu A&B, Covid) - Nasopharyngeal Swab     Status: None   Collection Time: 02/16/20  3:29 PM   Specimen: Nasopharyngeal Swab  Result Value Ref Range Status   SARS Coronavirus 2 by RT PCR NEGATIVE NEGATIVE Final    Comment: (NOTE) SARS-CoV-2 target nucleic acids are NOT DETECTED.  The SARS-CoV-2 RNA is generally detectable in upper respiratoy specimens during the acute phase of infection. The lowest concentration of SARS-CoV-2 viral copies this assay can detect is 131 copies/mL. A negative result does not preclude SARS-Cov-2 infection and should not be used as the sole basis for treatment or other patient management decisions. A negative result may occur with  improper specimen collection/handling, submission of specimen other than nasopharyngeal swab, presence of viral mutation(s) within the areas targeted by this assay, and inadequate number of viral copies (<131 copies/mL). A negative result must be combined with clinical observations, patient history, and epidemiological information. The expected result is Negative.  Fact Sheet for Patients:  PinkCheek.be  Fact Sheet for Healthcare Providers:  GravelBags.it  This test is no t yet approved or cleared by the [place] FDA and  has been authorized for detection and/or diagnosis of SARS-CoV-2 by FDA under an Emergency Use Authorization (EUA). This EUA will remain  in effect (meaning this test can be  used) for the duration of the COVID-19 declaration under Section 564(b)(1) of the Act, 21 U.S.C. section 360bbb-3(b)(1), unless the authorization is terminated or revoked sooner.     Influenza A by PCR NEGATIVE NEGATIVE Final   Influenza B by PCR NEGATIVE NEGATIVE Final    Comment: (NOTE) The Xpert Xpress SARS-CoV-2/FLU/RSV assay is intended as an aid in  the diagnosis of influenza from Nasopharyngeal swab specimens and  should not be used as a sole basis for treatment. Nasal washings and  aspirates are unacceptable for Xpert Xpress SARS-CoV-2/FLU/RSV  testing.  Fact Sheet for Patients: PinkCheek.be  Fact Sheet for Healthcare Providers: GravelBags.it  This test is not yet approved or cleared by the [place] FDA and  has been authorized for detection and/or diagnosis of SARS-CoV-2 by  FDA under an Emergency Use Authorization (EUA). This EUA will remain  in effect (meaning this test can be used) for the duration of the  Covid-19 declaration under Section 564(b)(1) of the Act, 21  U.S.C. section 360bbb-3(b)(1), unless the authorization is  terminated or revoked. Performed at [facility], [address], [place], [place] [address]   Urine culture     Status: None   Collection Time: 02/16/20  3:29 PM   Specimen: Urine, Clean Catch  Result Value Ref Range Status   Specimen Description   Final    URINE, CLEAN CATCH Performed at [facility], [address] [place], [place] [address]    Special Requests   Final    Normal Performed at [facility], [address] [place], [place] [address]    Culture   Final    NO GROWTH Performed at [facility], [place] [address], [place],  [address]    Report Status 02/18/2020 FINAL  Final     Time coordinating discharge: Over 30 minutes  SIGNED:   Darliss Cheney, MD  [facility] 02/20/2020, 1:27 PM  If 7PM-7AM, please  contact night-coverage [email or web address]

## 2020-02-20 NOTE — Care Management Important Message (Signed)
Important Message  Patient Details  Name: Hector [name] MRN: [id number] Date of Birth: [birth year]   Medicare Important Message Given:  Yes     Shruthi Northrup [address] 02/20/2020, 3:34 PM

## 2020-02-20 NOTE — Progress Notes (Signed)
Inpatient Rehab Admissions Coordinator:   I received authorization from pt.'s insurance and will plan to admit pt.  To CIR today. RN may call Charge Rn at [phone].  Clemens Catholic, [place], [place] Admissions Coordinator  [phone] ([place]) [phone] (office)

## 2020-02-20 NOTE — Progress Notes (Signed)
PMR Admission Coordinator Pre-Admission Assessment  Patient: Hector [name] is an 79 y.o., male MRN: [id number] DOB: 01/20/1941 Height: 5' 11" (180.3 cm) Weight: 90.7 kg              Insurance Information HMO:    PPO: yes       PCP:      IPA:      80/20:      OTHER:  PRIMARY: [employer]      Policy#: [id number]      Subscriber: Patient  CM Name:  Kiara     Phone#: [phone]  Fax#: [phone] clinical updates due 10/22 Pre-Cert#: [id number]      Employer:  Benefits:  Phone #:      Name:  Eff. Date: 05/09/2019  Deductible: does not have for in-network providers OOP Max: $7,550 ($2,156.23 met) CIR: $360/day co-pay for days 1-6, $0/day co-pay for days 7-90 SNF: 100% coverage, 0% co-insurance; limited by medical necessity Outpatient:  $15-$40/visit co-pay pending service; visits limited by medical necessity Home Health:  100% coverage, 0% co-insurance; limited by medical necessity Providers: in network  SECONDARY:       Policy#:       Phone#:   Emergency Contact Information Contact Information    Name Relation Home Work Mobile   Rossi,Karen Spouse [phone]     ADKINS,LISA Daughter [phone]       Current Medical History  Patient Admitting Diagnosis: CVA History of Present Illness: Hector [name] is a 79 y.o. right-handed male with history of hypertension, atypical carcinoid tumor, BPH with TURP, remote smoker.  History taken from chart review and patient.  Patient lives with spouse in [place] and has been staying in the area with daughter after wife's recent hysterectomy.  Independent prior to admission.  1 level home 2-3 steps to entry.  He presented on 02/16/2020 with right hemiparesis and dysarthria.  Cranial CT scan showed few scattered hypoattenuating foci in the basal ganglia and left external capsule.  CT angiogram of head and neck no emergent large vessel occlusion.  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  No hemorrhage or  mass-effect.  Echocardiogram with ejection fraction of 60-65%, no wall motion abnormalities.  Hospital course further complicated by prediabetes.  Patient with associated dysarthria.  Currently maintained on aspirin and Plavix for CVA prophylaxis x3 weeks and aspirin alone.  Subcutaneous Lovenox for DVT prophylaxis.  Tolerating regular diet.  Therapy evaluations completed with recommendations of physical medicine rehab consult.  Complete NIHSS TOTAL: 7 Glasgow Coma Scale Score: 15  Past Medical History  Past Medical History:  Diagnosis Date  . Diverticulitis 04/2012   admission [facility]  . Former smoker    Smoked for 20 yrs;  Quit  25 yrs ago.  . GERD (gastroesophageal reflux disease)   . Hypertension   . Hypokalemia   . Lung cancer (HCC)   . 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 Polycythemia   . Prostatitis    S/P  TURP    Family History  family history includes Cancer in his paternal uncle; Heart disease (age of onset: 84) in his father; Stroke (age of onset: 85) in his mother.  Prior Rehab/Hospitalizations:  Has the patient had prior rehab or hospitalizations prior to admission? Yes  Has the patient had major surgery during 100 days prior to admission? No  Current Medications   Current Facility-Administered Medications:  .  0.9 %  sodium chloride infusion, , Intravenous, Continuous,   Khaliqdina, Salman, MD, Last Rate: 100 mL/hr at 02/20/20 1017, New Bag at 02/20/20 1017 .  acetaminophen (TYLENOL) tablet 650 mg, 650 mg, Oral, Q4H PRN **OR** acetaminophen (TYLENOL) 160 MG/5ML solution 650 mg, 650 mg, Per Tube, Q4H PRN **OR** acetaminophen (TYLENOL) suppository 650 mg, 650 mg, Rectal, Q4H PRN, Girguis, David, MD .  aspirin EC tablet 81 mg, 81 mg, Oral, Daily, Pahwani, Ravi, MD, 81 mg at 02/20/20 1010 .  atenolol (TENORMIN) tablet 50 mg, 50 mg, Oral, Daily, Pahwani, Ravi, MD, 50 mg at 02/20/20 1010 .  atorvastatin (LIPITOR) tablet 20 mg, 20 mg, Oral, Daily, Biby,  Sharon L, NP, 20 mg at 02/20/20 1014 .  clopidogrel (PLAVIX) tablet 75 mg, 75 mg, Oral, Daily, Khaliqdina, Salman, MD, 75 mg at 02/20/20 1010 .  losartan (COZAAR) tablet 25 mg, 25 mg, Oral, Daily, Pahwani, Ravi, MD, 25 mg at 02/20/20 1010 .  senna-docusate (Senokot-S) tablet 1 tablet, 1 tablet, Oral, QHS PRN, Girguis, David, MD  Patients Current Diet:  Diet Order            Diet Heart Room service appropriate? Yes; Fluid consistency: Thin  Diet effective now                 Precautions / Restrictions Precautions Precautions: Fall, Other (comment) Precaution Comments: R sided weakness, chronic peripheral neuropathy Restrictions Weight Bearing Restrictions: No   Has the patient had 2 or more falls or a fall with injury in the past year?Yes  Prior Activity Level  Pt. Was active in the community, going out daily   Prior Functional Level Prior Function Level of Independence: Independent Comments: still some mild dysarthria  Self Care: Did the patient need help bathing, dressing, using the toilet or eating?  Independent  Indoor Mobility: Did the patient need assistance with walking from room to room (with or without device)? Independent  Stairs: Did the patient need assistance with internal or external stairs (with or without device)? Independent  Functional Cognition: Did the patient need help planning regular tasks such as shopping or remembering to take medications? Independent  Home Assistive Devices / Equipment Home Assistive Devices/Equipment: None Home Equipment: None  Prior Device Use: Indicate devices/aids used by the patient prior to current illness, exacerbation or injury? None of the above  Current Functional Level Cognition  Arousal/Alertness: Awake/alert Overall Cognitive Status: Within Functional Limits for tasks assessed Orientation Level: Oriented X4 Memory: Appears intact (recalled 5/5 words independently) Awareness: Appears intact Problem Solving:  Appears intact Behaviors:  (pt became tearful during session several times) Safety/Judgment: Appears intact    Extremity Assessment (includes Sensation/Coordination)  Upper Extremity Assessment: RUE deficits/detail RUE Deficits / Details: Brunstrum II (has some shoulder flexion/extension, elbow flexion, forerarm pronation, wrist flexion/extension, finger flexion/extension) RUE Coordination: decreased fine motor, decreased gross motor  Lower Extremity Assessment: RLE deficits/detail, LLE deficits/detail RLE Deficits / Details: ankle dorsiflexion 2+ to 3-/5, hip flexor 3/5, quad 2+ to 3-/5 RLE Sensation: history of peripheral neuropathy, decreased proprioception RLE Coordination: decreased gross motor LLE Deficits / Details: WNL LLE Sensation: history of peripheral neuropathy, WNL LLE Coordination: WNL    ADLs  Overall ADL's : Needs assistance/impaired Eating/Feeding: Set up Eating/Feeding Details (indicate cue type and reason): sitting in recliner Grooming: Oral care Grooming Details (indicate cue type and reason): standing at sink with sara stedy, minguard-Mod A for standing balance Upper Body Bathing: Minimal assistance Upper Body Bathing Details (indicate cue type and reason): sitting in recliner Lower Body Bathing: Maximal assistance Lower Body Bathing Details (  indicate cue type and reason): min A sit<>stand with sara stedy Upper Body Dressing : Moderate assistance Upper Body Dressing Details (indicate cue type and reason): sitting in recliner Lower Body Dressing: Maximal assistance Lower Body Dressing Details (indicate cue type and reason): min A sit<>stand with sara stedy Toilet Transfer: Minimal assistance Toilet Transfer Details (indicate cue type and reason): with sara stedy Toileting- Clothing Manipulation and Hygiene: Maximal assistance Toileting - Clothing Manipulation Details (indicate cue type and reason): min A sit<>stand in sara stedy    Mobility  Overal bed  mobility: Needs Assistance Bed Mobility: Rolling, Sidelying to Sit Rolling: Mod assist Sidelying to sit: Mod assist Supine to sit: HOB elevated, Total assist Sit to supine: Max assist General bed mobility comments: much improved today- ModA needed mostly for trunk control due to ongoing dynamic unsteadiness but quickly able to find midline sitting EOB    Transfers  Overall transfer level: Needs assistance Equipment used: Ambulation equipment used Transfer via Lift Equipment: Stedy Transfers: Sit to/from Stand Sit to Stand: Min assist General transfer comment: MinA to boost to full upright standing in stedy- Mod cues for weight shift right due to tendency to offload R LE but able to correct with min-mod facilitation. RLE fatigues easily and with occasional buckling as fatigue increased    Ambulation / Gait / Stairs / Wheelchair Mobility  Ambulation/Gait General Gait Details: deferred    Posture / Balance Dynamic Sitting Balance Sitting balance - Comments: min guard for safety, improved since eval Balance Overall balance assessment: Needs assistance Sitting-balance support: Bilateral upper extremity supported, Single extremity supported Sitting balance-Leahy Scale: Fair Sitting balance - Comments: min guard for safety, improved since eval Postural control: Posterior lean Standing balance support: During functional activity Standing balance-Leahy Scale: Poor Standing balance comment: reliant on external support, tendency for L lean and somewhat difficult to find midline    Special needs/care consideration none     Previous Home Environment (from acute therapy documentation) Living Arrangements: Spouse/significant other, Children  Lives With: Spouse Available Help at Discharge: Family, Available PRN/intermittently Type of Home: House Home Layout: One level Home Access: Stairs to enter Entrance Stairs-Rails: None Entrance Stairs-Number of Steps: 2-3 STE no rails Bathroom  Shower/Tub: Walk-in shower, Other (comment) Bathroom Toilet: Standard Bathroom Accessibility: Yes Home Care Services: No Additional Comments: patient/spouse life in [place] but are staying with daughter as wife recovers from surgery, OK to stay with daughter as he recovers too  Discharge Living Setting Plans for Discharge Living Setting: Patient's home Type of Home at Discharge: House Discharge Home Layout: Multi-level Alternate Level Stairs-Rails: Right Alternate Level Stairs-Number of Steps: Full flight Discharge Home Access: Stairs to enter Entrance Stairs-Rails: Right Entrance Stairs-Number of Steps: 15 Discharge Bathroom Shower/Tub: Tub/shower unit, Walk-in shower Discharge Bathroom Toilet: Standard Discharge Bathroom Accessibility: No Does the patient have any problems obtaining your medications?: No  Social/Family/Support Systems Patient Roles: Spouse Contact Information: [phone] Anticipated Caregiver: Karen Nadeau  Anticipated Caregiver's Contact Information: [phone] Caregiver Availability: 24/7 Discharge Plan Discussed with Primary Caregiver: Yes Is Caregiver In Agreement with Plan?: Yes   Goals Patient/Family Goal for Rehab: PT/OT/SLP Min A Expected length of stay: 19-23 days Pt/Family Agrees to Admission and willing to participate: Yes Program Orientation Provided & Reviewed with Pt/Caregiver Including Roles  & Responsibilities: Yes   Decrease burden of Care through IP rehab admission: Specialzed equipment needs, Decrease number of caregivers and Patient/family education   Possible need for SNF placement upon discharge:Not anticipated   Patient Condition: This patient's   condition remains as documented in the consult dated 02/18/20, in which the Rehabilitation Physician determined and documented that the patient's condition is appropriate for intensive rehabilitative care in an inpatient rehabilitation facility. Will admit to inpatient rehab  today.  Preadmission Screen Completed By:  Kynlea Blackston B Shaun Runyon, CCC-SLP, 02/20/2020 1:28 PM ______________________________________________________________________   Discussed status with Dr. Raulkar on 02/20/20 at 930am received approval for admission today.  Admission Coordinator:  Jaskaran Dauzat B Ronith Berti, time 02/20/20/Date 9:30   

## 2020-02-20 NOTE — Progress Notes (Signed)
Neuro's plan to continue Plavix and Aspirin for 3 weeks; then Plavix alone. Orders were clarified with Merrily Pew, PA and fixed in the computer.  Rae Mar, PharmD Pharmacist

## 2020-02-20 NOTE — Progress Notes (Signed)
Inpatient Rehab Admissions Coordinator:   I met with pt. At bedside and notified him that I have not yet heard from Pt.'s insurance regarding approval/denial for CIR admission. I do anticipate hearing from them by end of day today.   Clemens Catholic, [place], [place] Admissions Coordinator  [phone] ([place]) [phone] (office)

## 2020-02-20 NOTE — Progress Notes (Signed)
Physical Medicine and Rehabilitation Consult Reason for Consult: Right side weakness Referring Physician: [facility]   HPI: Hector [name] is a 79 y.o. right-handed male with history of hypertension, atypical carcinoid tumor, BPH with TURP, remote smoker.  History taken from chart review and patient.  Patient lives with spouse in [place] and has been staying in the area with daughter after wife's recent hysterectomy.  Independent prior to admission.  1 level home 2-3 steps to entry.  He presented on 02/16/2020 with right hemiparesis and dysarthria.  Cranial CT scan showed few scattered hypoattenuating foci in the basal ganglia and left external capsule.  CT angiogram of head and neck no emergent large vessel occlusion.  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  No hemorrhage or mass-effect.  Echocardiogram with ejection fraction of 60-65%, no wall motion abnormalities.  Hospital course further complicated by prediabetes.  Patient with associated dysarthria.  Currently maintained on aspirin and Plavix for CVA prophylaxis x3 weeks and aspirin alone.  Subcutaneous Lovenox for DVT prophylaxis.  Tolerating regular diet.  Therapy evaluations completed with recommendations of physical medicine rehab consult.  Review of Systems  Constitutional: Negative for chills and fever.  HENT: Negative for hearing loss.   Eyes: Negative for blurred vision and double vision.  Respiratory: Negative for cough and shortness of breath.   Cardiovascular: Negative for chest pain, palpitations and leg swelling.  Gastrointestinal: Positive for constipation. Negative for heartburn, nausea and vomiting.       GERD  Genitourinary: Positive for urgency. Negative for dysuria and hematuria.  Musculoskeletal: Positive for myalgias.  Skin: Negative for rash.  Neurological: Positive for sensory change, speech change, focal weakness and weakness.  All other systems reviewed and are negative.      Past Medical  History:  Diagnosis Date  . Diverticulitis 04/2012   admission [facility]  . Former smoker    Smoked for 20 yrs;  Quit  25 yrs ago.  Marland Kitchen GERD (gastroesophageal reflux disease)   . Hypertension   . Hypokalemia   . Lung cancer ([place])   . 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 Polycythemia   . Prostatitis    S/P  TURP        Past Surgical History:  Procedure Laterality Date  . APPENDECTOMY  02/25/11  . COLONOSCOPY    . LOBECTOMY    . TRANSURETHRAL RESECTION OF PROSTATE  2004        Family History  Problem Relation Age of Onset  . Stroke Mother 25  . Heart disease Father 41  . Cancer Paternal Uncle        Carcinoma of unknown origin.  . Colon cancer Neg Hx    Social History:  reports that he quit smoking about 31 years ago. He has never used smokeless tobacco. He reports current alcohol use of about 7.0 standard drinks of alcohol per week. He reports that he does not use drugs. Allergies: No Known Allergies       Medications Prior to Admission  Medication Sig Dispense Refill  . acetaminophen (TYLENOL) 500 MG tablet Take 500-1,000 mg by mouth every 6 (six) hours as needed for mild pain or headache.    Marland Kitchen aspirin 81 MG tablet Take 81 mg by mouth at bedtime.     Marland Kitchen atenolol (TENORMIN) 50 MG tablet TAKE ONE (1) TABLET EACH DAY (Patient taking differently: Take 50 mg by mouth daily. ) 90 tablet 1  . diazepam (VALIUM) 5 MG tablet Take  5 mg by mouth 2 (two) times daily as needed for anxiety.    . fluticasone (FLONASE) 50 MCG/ACT nasal spray Place 1-2 sprays into both nostrils daily as needed for allergies or rhinitis.    Marland Kitchen losartan (COZAAR) 25 MG tablet Take 25 mg by mouth at bedtime.     . sucralfate (CARAFATE) 1 g tablet Take 1 g by mouth in the morning and at bedtime.     . vitamin B-12 (CYANOCOBALAMIN) 1000 MCG tablet Take 1,000 mcg by mouth daily.        Home: Home Living Family/patient expects to be discharged to:: Private  residence Living Arrangements: Spouse/significant other, Children (wife just had hysterectomy) Available Help at Discharge: Family, Available PRN/intermittently (but daughter can be available 24/7 if needed) Type of Home: House Home Access: Stairs to enter [employer] of Steps: 2-3 STE no rails Entrance Stairs-Rails: None Home Layout: One level Bathroom Shower/Tub: Walk-in shower, Other (comment) (walkin is very small shower with no space for seat; has other bathroom with more space but would have to step over tub) Bathroom Toilet: Standard Bathroom Accessibility: Yes Home Equipment: None Additional Comments: patient/spouse life in [place] but are staying with daughter as wife recovers from surgery, OK to stay with daughter as he recovers too  Lives With: Spouse  Functional History: Prior Function Level of Independence: Independent Comments: still some mild dysarthria Functional Status:  Mobility: Bed Mobility Overal bed mobility: Needs Assistance Bed Mobility: Supine to Sit, Sit to Supine Supine to sit: HOB elevated, Total assist Sit to supine: Max assist General bed mobility comments: able to get BLEs off EOB but then needed totalA to bring trunk up and scoot hips around to EOB; with return to bed, able to manage trunk but needed MaxA for BLEs Transfers Overall transfer level: Needs assistance Equipment used: Rolling Rodin (2 wheeled) Transfers: Sit to/from Stand Sit to Stand: Total assist, From elevated surface General transfer comment: able to perform partial stand from elevated surface, but unable to fully extend hips into standing position; minimal coordination/activation R LE Ambulation/Gait General Gait Details: unable  ADL:  Cognition: Cognition Overall Cognitive Status: Within Functional Limits for tasks assessed Arousal/Alertness: Awake/alert Orientation Level: Oriented X4 Memory: Appears intact (recalled 5/5 words independently) Awareness: Appears  intact Problem Solving: Appears intact Behaviors:  (pt became tearful during session several times) Safety/Judgment: Appears intact Cognition Arousal/Alertness: Awake/alert Behavior During Therapy: WFL for tasks assessed/performed Overall Cognitive Status: Within Functional Limits for tasks assessed  Blood pressure (!) 148/85, pulse 70, temperature 98.5 F (36.9 C), temperature source Oral, resp. rate 16, height 5\' 11"  (1.803 m), weight 90.7 kg, SpO2 97 %. Physical Exam Vitals reviewed.  Constitutional:      General: He is not in acute distress.    Appearance: Normal appearance.  HENT:     Head: Normocephalic and atraumatic.     Right Ear: External ear normal.     Left Ear: External ear normal.     Nose: Nose normal.  Eyes:     General:        Right eye: No discharge.        Left eye: No discharge.     Extraocular Movements: Extraocular movements intact.  Cardiovascular:     Rate and Rhythm: Normal rate and regular rhythm.  Pulmonary:     Effort: Pulmonary effort is normal. No respiratory distress.     Breath sounds: Normal breath sounds. No stridor.  Abdominal:     General: Abdomen is flat. Bowel  sounds are normal. There is no distension.  Musculoskeletal:     Cervical back: Normal range of motion and neck supple.     Comments: No edema or tenderness in extremities  Skin:    General: Skin is warm and dry.  Neurological:     Mental Status: He is alert.     Comments: Alert and oriented x3 Dysarthria Follows commands. Motor: RUE: Shoulder abduction, elbow flexion/extension 2+/5, wrist extension, handgrip 1/5 RLE: Hip flexion, knee extension 2/5, ankle dorsiflexion 3/5 LUE/LLE: 5/5 proximal to distal Sensation diminished to light touch bilateral lower extremities  Psychiatric:        Mood and Affect: Mood normal.        Behavior: Behavior normal.     Lab Results Last 24 Hours  Results for orders placed or performed during the hospital encounter of 02/16/20 (from  the past 24 hour(s))  Vitamin B12     Status: None   Collection Time: 02/17/20  8:40 PM  Result Value Ref Range   Vitamin B-12 514 180 - 914 pg/mL  Basic metabolic panel     Status: Abnormal   Collection Time: 02/18/20  1:24 AM  Result Value Ref Range   Sodium 139 135 - 145 mmol/L   Potassium 3.9 3.5 - 5.1 mmol/L   Chloride 104 98 - 111 mmol/L   CO2 26 22 - 32 mmol/L   Glucose, Bld 123 (H) 70 - 99 mg/dL   BUN 11 8 - 23 mg/dL   Creatinine, Ser 1.07 0.61 - 1.24 mg/dL   Calcium 9.2 8.9 - 10.3 mg/dL   GFR, Estimated >60 >60 mL/min   Anion gap 9 5 - 15  CBC with Differential/Platelet     Status: None   Collection Time: 02/18/20  1:24 AM  Result Value Ref Range   WBC 8.3 4.0 - 10.5 K/uL   RBC 5.20 4.22 - 5.81 MIL/uL   Hemoglobin 16.2 13.0 - 17.0 g/dL   HCT 47.8 39 - 52 %   MCV 91.9 80.0 - 100.0 fL   MCH 31.2 26.0 - 34.0 pg   MCHC 33.9 30.0 - 36.0 g/dL   RDW 12.3 11.5 - 15.5 %   Platelets 187 150 - 400 K/uL   nRBC 0.0 0.0 - 0.2 %   Neutrophils Relative % 67 %   Neutro Abs 5.7 1.7 - 7.7 K/uL   Lymphocytes Relative 19 %   Lymphs Abs 1.6 0.7 - 4.0 K/uL   Monocytes Relative 9 %   Monocytes Absolute 0.8 0.1 - 1.0 K/uL   Eosinophils Relative 3 %   Eosinophils Absolute 0.2 0.0 - 0.5 K/uL   Basophils Relative 1 %   Basophils Absolute 0.1 0.0 - 0.1 K/uL   Immature Granulocytes 1 %   Abs Immature Granulocytes 0.07 0.00 - 0.07 K/uL  Magnesium     Status: None   Collection Time: 02/18/20  1:24 AM  Result Value Ref Range   Magnesium 1.9 1.7 - 2.4 mg/dL      Imaging Results (Last 48 hours)  CT Angio Head W or Wo Contrast  Result Date: 02/17/2020 CLINICAL DATA:  Stroke-like symptoms. Left facial numbness and dizziness. EXAM: CT ANGIOGRAPHY HEAD AND NECK TECHNIQUE: Multidetector CT imaging of the head and neck was performed using the standard protocol during bolus administration of intravenous contrast. Multiplanar CT image reconstructions and  MIPs were obtained to evaluate the vascular anatomy. Carotid stenosis measurements (when applicable) are obtained utilizing NASCET criteria, using the distal internal carotid  diameter as the denominator. CONTRAST:  151mL OMNIPAQUE IOHEXOL 350 MG/ML SOLN COMPARISON:  None. FINDINGS: CT HEAD FINDINGS Brain: There is no mass, hemorrhage or extra-axial collection. There is generalized atrophy without lobar predilection. There is hypoattenuation of the periventricular white matter, most commonly indicating chronic ischemic microangiopathy. Skull: The visualized skull base, calvarium and extracranial soft tissues are normal. Sinuses/Orbits: No fluid levels or advanced mucosal thickening of the visualized paranasal sinuses. No mastoid or middle ear effusion. The orbits are normal. CTA NECK FINDINGS SKELETON: There is no bony spinal canal stenosis. No lytic or blastic lesion. OTHER NECK: Normal pharynx, larynx and major salivary glands. No cervical lymphadenopathy. Unremarkable thyroid gland. UPPER CHEST: No pneumothorax or pleural effusion. No nodules or masses. AORTIC ARCH: There is calcific atherosclerosis of the aortic arch. There is no aneurysm, dissection or hemodynamically significant stenosis of the visualized portion of the aorta. Conventional 3 vessel aortic branching pattern. The visualized proximal subclavian arteries are widely patent. RIGHT CAROTID SYSTEM: Normal without aneurysm, dissection or stenosis. LEFT CAROTID SYSTEM: Normal without aneurysm, dissection or stenosis. VERTEBRAL ARTERIES: Left dominant configuration. Both origins are clearly patent. There is no dissection, occlusion or flow-limiting stenosis to the skull base (V1-V3 segments). CTA HEAD FINDINGS POSTERIOR CIRCULATION: --Vertebral arteries: Normal V4 segments. --Inferior cerebellar arteries: Normal. --Basilar artery: Normal. --Superior cerebellar arteries: Normal. --Posterior cerebral arteries (PCA): Normal. ANTERIOR CIRCULATION:  --Intracranial internal carotid arteries: Atherosclerotic calcification of the internal carotid arteries at the skull base without hemodynamically significant stenosis. --Anterior cerebral arteries (ACA): Normal. Both A1 segments are present. Patent anterior communicating artery (a-comm). --Middle cerebral arteries (MCA): Normal. VENOUS SINUSES: As permitted by contrast timing, patent. ANATOMIC VARIANTS: Fetal origin of the right posterior cerebral artery. Review of the MIP images confirms the above findings. IMPRESSION: 1. No emergent large vessel occlusion or hemodynamically significant stenosis of the head or neck. 2. Chronic ischemic microangiopathy and generalized atrophy. Aortic Atherosclerosis (ICD10-I70.0). Electronically Signed   By: Ulyses Jarred M.D.   On: 02/17/2020 03:48   CT Head Wo Contrast  Result Date: 02/16/2020 CLINICAL DATA:  Left facial burning and gait instability, slurred speech noted at 1200 hours EXAM: CT HEAD WITHOUT CONTRAST TECHNIQUE: Contiguous axial images were obtained from the base of the skull through the vertex without intravenous contrast. COMPARISON:  None. 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 No large CT evident area large vascular territory or cortically based infarction. No evidence of hemorrhage, hydrocephalus, extra-axial collection, visible mass lesion or mass effect. Symmetric prominence of the ventricles, cisterns and sulci compatible with parenchymal volume loss. Patchy areas of white matter hypoattenuation are most compatible with chronic microvascular angiopathy. Vascular: Atherosclerotic calcification of the carotid siphons and intradural vertebral arteries. No hyperdense vessel. Skull: No calvarial fracture or suspicious osseous lesion. No scalp swelling or hematoma. Sinuses/Orbits: Mild thickening in  the paranasal sinuses, left greater than right. No air-fluid levels are pneumatized secretions. Mastoid air cells are predominantly clear with pneumatization of the petrous apices. Included orbital structures are unremarkable. Other: None. IMPRESSION: 1. 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 2. No large CT evident area of large vascular territory or cortically based infarction or other acute intracranial abnormality. If there is persisting concern for acute infarction, MRI is more sensitive and specific for early features  of ischemia. 3. Parenchymal volume loss and chronic microvascular angiopathy. 4. Intracranial atherosclerosis. Electronically Signed   By: Lovena Le M.D.   On: 02/16/2020 15:33   CT Angio Neck W and/or Wo Contrast  Result Date: 02/17/2020 CLINICAL DATA:  Stroke-like symptoms. Left facial numbness and dizziness. EXAM: CT ANGIOGRAPHY HEAD AND NECK TECHNIQUE: Multidetector CT imaging of the head and neck was performed using the standard protocol during bolus administration of intravenous contrast. Multiplanar CT image reconstructions and MIPs were obtained to evaluate the vascular anatomy. Carotid stenosis measurements (when applicable) are obtained utilizing NASCET criteria, using the distal internal carotid diameter as the denominator. CONTRAST:  157mL OMNIPAQUE IOHEXOL 350 MG/ML SOLN COMPARISON:  None. FINDINGS: CT HEAD FINDINGS Brain: There is no mass, hemorrhage or extra-axial collection. There is generalized atrophy without lobar predilection. There is hypoattenuation of the periventricular white matter, most commonly indicating chronic ischemic microangiopathy. Skull: The visualized skull base, calvarium and extracranial soft tissues are normal. Sinuses/Orbits: No fluid levels or advanced mucosal thickening of the visualized paranasal sinuses. No  mastoid or middle ear effusion. The orbits are normal. CTA NECK FINDINGS SKELETON: There is no bony spinal canal stenosis. No lytic or blastic lesion. OTHER NECK: Normal pharynx, larynx and major salivary glands. No cervical lymphadenopathy. Unremarkable thyroid gland. UPPER CHEST: No pneumothorax or pleural effusion. No nodules or masses. AORTIC ARCH: There is calcific atherosclerosis of the aortic arch. There is no aneurysm, dissection or hemodynamically significant stenosis of the visualized portion of the aorta. Conventional 3 vessel aortic branching pattern. The visualized proximal subclavian arteries are widely patent. RIGHT CAROTID SYSTEM: Normal without aneurysm, dissection or stenosis. LEFT CAROTID SYSTEM: Normal without aneurysm, dissection or stenosis. VERTEBRAL ARTERIES: Left dominant configuration. Both origins are clearly patent. There is no dissection, occlusion or flow-limiting stenosis to the skull base (V1-V3 segments). CTA HEAD FINDINGS POSTERIOR CIRCULATION: --Vertebral arteries: Normal V4 segments. --Inferior cerebellar arteries: Normal. --Basilar artery: Normal. --Superior cerebellar arteries: Normal. --Posterior cerebral arteries (PCA): Normal. ANTERIOR CIRCULATION: --Intracranial internal carotid arteries: Atherosclerotic calcification of the internal carotid arteries at the skull base without hemodynamically significant stenosis. --Anterior cerebral arteries (ACA): Normal. Both A1 segments are present. Patent anterior communicating artery (a-comm). --Middle cerebral arteries (MCA): Normal. VENOUS SINUSES: As permitted by contrast timing, patent. ANATOMIC VARIANTS: Fetal origin of the right posterior cerebral artery. Review of the MIP images confirms the above findings. IMPRESSION: 1. No emergent large vessel occlusion or hemodynamically significant stenosis of the head or neck. 2. Chronic ischemic microangiopathy and generalized atrophy. Aortic Atherosclerosis (ICD10-I70.0). Electronically  Signed   By: Ulyses Jarred M.D.   On: 02/17/2020 03:48   MR BRAIN WO CONTRAST  Result Date: 02/17/2020 CLINICAL DATA:  Right-sided weakness EXAM: MRI HEAD WITHOUT CONTRAST TECHNIQUE: Multiplanar, multiecho pulse sequences of the brain and surrounding structures were obtained without intravenous contrast. COMPARISON:  None. 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 No acute hemorrhage. Multifocal hyperintense T2-weighted signal within the white matter. There is generalized atrophy without lobar predilection. No chronic microhemorrhage. Normal midline structures. Vascular: Normal flow voids. Skull and upper cervical spine: Normal marrow signal. Sinuses/Orbits: Negative. Other: None. 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 No hemorrhage or mass effect. Electronically Signed   By: Ulyses Jarred M.D.   On: 02/17/2020 19:08   DG Chest Portable 1 View  Result Date: 02/16/2020 CLINICAL DATA:  Dizziness EXAM: PORTABLE CHEST 1 VIEW COMPARISON:  None. FINDINGS: The heart size and mediastinal contours are  within normal limits. Both lungs are clear. The visualized skeletal structures are unremarkable. IMPRESSION: No active disease. Electronically Signed   By: Inez Catalina M.D.   On: 02/16/2020 15:45   DG Swallowing Func-Speech Pathology  Result Date: 02/18/2020 Objective Swallowing Evaluation: Type of Study: MBS-Modified Barium Swallow Study  Patient Details Name: Hector [name] MRN: [id number] Date of Birth: 21-Aug-1940 Today's Date: 02/18/2020 Time: SLP Start Time (ACUTE ONLY): 1106 -SLP Stop Time (ACUTE ONLY): 1113 SLP Time Calculation (min) (ACUTE ONLY): 7 min Past Medical History: Past Medical History: Diagnosis Date . Diverticulitis 04/2012  admission [facility] . Former smoker   Smoked for 20 yrs;  Quit  25 yrs ago. Marland Kitchen GERD (gastroesophageal reflux disease)  . Hypertension  . Hypokalemia  . Lung cancer ([place])  . Personal history of colonic  adenoma 04/2009 . Polycythemia  . Prostatitis   S/P  TURP Past Surgical History: Past Surgical History: Procedure Laterality Date . APPENDECTOMY  02/25/11 . COLONOSCOPY   . LOBECTOMY   . TRANSURETHRAL RESECTION OF PROSTATE  2004 HPI: 79 yo male adm [facility] with right sided weakness, also has polycythemia, lung cancer hx s/p VATS and lobectomy, colonic adenoma, insomnia, HTN.  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  Speech eval ordered.  Subjective: Pt awake, alert, pleasant, participative.  Daughter present for evaluation Assessment / Plan / Recommendation CHL IP CLINICAL IMPRESSIONS 02/18/2020 Clinical Impression Pt presents with grossly normal swallow function.  There was no penetration or aspiration with any consistencies trialed.  Oral phase was efficient and well controlled.  Swallow initiation was timely.  Recommend regular texture diet with thin liquids. No further ST needs for dysphagia.  SLP will sign off for swallowing. SLP Visit Diagnosis Dysphagia, unspecified (R13.10) Attention and concentration deficit following -- Frontal lobe and executive function deficit following -- Impact on safety and function No limitations   CHL IP TREATMENT RECOMMENDATION 02/18/2020 Treatment Recommendations No treatment recommended at this time   No flowsheet data found. CHL IP DIET RECOMMENDATION 02/18/2020 SLP Diet Recommendations Regular solids;Thin liquid Liquid Administration via Cup;Straw Medication Administration Whole meds with liquid Compensations Slow rate;Small sips/bites Postural Changes Seated upright at 90 degrees   CHL IP OTHER RECOMMENDATIONS 02/18/2020 Recommended Consults -- Oral Care Recommendations Oral care BID Other Recommendations --   CHL IP FOLLOW UP RECOMMENDATIONS 02/18/2020 Follow up Recommendations None   CHL IP FREQUENCY AND DURATION 02/18/2020 Speech Therapy Frequency (ACUTE ONLY) (No Data) Treatment Duration --      CHL IP ORAL PHASE  02/18/2020 Oral Phase WFL Oral - Pudding Teaspoon -- Oral - Pudding Cup -- Oral - Honey Teaspoon -- Oral - Honey Cup -- Oral - Nectar Teaspoon -- Oral - Nectar Cup -- Oral - Nectar Straw -- Oral - Thin Teaspoon -- Oral - Thin Cup WFL Oral - Thin Straw WFL Oral - Puree WFL Oral - Mech Soft -- Oral - Regular WFL Oral - Multi-Consistency -- Oral - Pill WFL Oral Phase - Comment --  CHL IP PHARYNGEAL PHASE 02/18/2020 Pharyngeal Phase WFL Pharyngeal- Pudding Teaspoon -- Pharyngeal -- Pharyngeal- Pudding Cup -- Pharyngeal -- Pharyngeal- Honey Teaspoon -- Pharyngeal -- Pharyngeal- Honey Cup -- Pharyngeal -- Pharyngeal- Nectar Teaspoon -- Pharyngeal -- Pharyngeal- Nectar Cup -- Pharyngeal -- Pharyngeal- Nectar Straw -- Pharyngeal -- Pharyngeal- Thin Teaspoon -- Pharyngeal -- Pharyngeal- Thin Cup [facility] Pharyngeal Material does not enter airway Pharyngeal- Thin Straw WFL Pharyngeal Material does not enter  airway Pharyngeal- Puree WFL Pharyngeal Material does not enter airway Pharyngeal- Mechanical Soft -- Pharyngeal -- Pharyngeal- Regular WFL Pharyngeal Material does not enter airway Pharyngeal- Multi-consistency -- Pharyngeal -- Pharyngeal- Pill WFL Pharyngeal Material does not enter airway Pharyngeal Comment --  CHL IP CERVICAL ESOPHAGEAL PHASE 02/18/2020 Cervical Esophageal Phase WFL Pudding Teaspoon -- Pudding Cup -- Honey Teaspoon -- Honey Cup -- Nectar Teaspoon -- Nectar Cup -- Nectar Straw -- Thin Teaspoon -- Thin Cup -- Thin Straw -- Puree -- Mechanical Soft -- Regular -- Multi-consistency -- Pill -- Cervical Esophageal Comment -- Celedonio Savage, MA, CCC-SLP Acute Rehabilitation Services Office: [phone] 02/18/2020, 12:09 PM              ECHOCARDIOGRAM COMPLETE  Result Date: 02/17/2020    ECHOCARDIOGRAM REPORT   Patient Name:   BUCK MCAFFEE Date of Exam: 02/17/2020 Medical Rec #:  [id number]      Height:       71.0 in Accession #:    [id number]     Weight:       200.0 lb Date of Birth:  Feb 28, 1941      BSA:           2.109 m Patient Age:    44 years       BP:           137/89 mmHg Patient Gender: M              HR:           68 bpm. Exam Location:  Inpatient Procedure: 2D Echo Indications:    right sided weakness  History:        Patient has no prior history of Echocardiogram examinations.                 Risk Factors:Former Smoker. Lung cancer.  Sonographer:    Jannett Celestine RDCS (AE) Referring Phys: [place]  Sonographer Comments: limited mobility due to right sided weakness. no true parasternal window IMPRESSIONS  1. Left ventricular ejection fraction, by estimation, is 60 to 65%. The left ventricle has normal function. The left ventricle has no regional wall motion abnormalities. Left ventricular diastolic parameters were normal.  2. Right ventricular systolic function is normal. The right ventricular size is normal.  3. The mitral valve is normal in structure. No evidence of mitral valve regurgitation. No evidence of mitral stenosis.  4. The aortic valve is normal in structure. Aortic valve regurgitation is not visualized. No aortic stenosis is present.  5. The inferior vena cava is normal in size with greater than 50% respiratory variability, suggesting right atrial pressure of 3 mmHg. FINDINGS  Left Ventricle: Left ventricular ejection fraction, by estimation, is 60 to 65%. The left ventricle has normal function. The left ventricle has no regional wall motion abnormalities. The left ventricular internal cavity size was normal in size. There is  no left ventricular hypertrophy. Left ventricular diastolic parameters were normal. Normal left ventricular filling pressure. Right Ventricle: The right ventricular size is normal. No increase in right ventricular wall thickness. Right ventricular systolic function is normal. Left Atrium: Left atrial size was normal in size. Right Atrium: Right atrial size was normal in size. Pericardium: There is no evidence of pericardial effusion. Mitral Valve: The mitral valve  is normal in structure. No evidence of mitral valve regurgitation. No evidence of mitral valve stenosis. Tricuspid Valve: The tricuspid valve is normal in structure. Tricuspid valve regurgitation is not demonstrated. No evidence of tricuspid  stenosis. Aortic Valve: The aortic valve is normal in structure. Aortic valve regurgitation is not visualized. No aortic stenosis is present. Pulmonic Valve: The pulmonic valve was not well visualized. Pulmonic valve regurgitation is not visualized. No evidence of pulmonic stenosis. Aorta: The aortic root is normal in size and structure. Venous: The inferior vena cava is normal in size with greater than 50% respiratory variability, suggesting right atrial pressure of 3 mmHg. IAS/Shunts: No atrial level shunt detected by color flow Doppler.  LEFT VENTRICLE PLAX 2D LVIDd:         3.50 cm  Diastology LVIDs:         2.20 cm  LV e' medial:    6.64 cm/s LV PW:         0.80 cm  LV E/e' medial:  8.6 LV IVS:        0.90 cm  LV e' lateral:   9.46 cm/s LVOT diam:     2.40 cm  LV E/e' lateral: 6.1 LV SV:         76 LV SV Index:   36 LVOT Area:     4.52 cm  RIGHT VENTRICLE RV S prime:     10.60 cm/s TAPSE (M-mode): 1.6 cm LEFT ATRIUM         Index LA diam:    3.90 cm 1.85 cm/m  AORTIC VALVE LVOT Vmax:   82.90 cm/s LVOT Vmean:  63.100 cm/s LVOT VTI:    0.168 m  AORTA Ao Root diam: 3.10 cm MITRAL VALVE MV Area (PHT): 2.97 cm    SHUNTS MV Decel Time: 255 msec    Systemic VTI:  0.17 m MV E velocity: 57.40 cm/s  Systemic Diam: 2.40 cm MV A velocity: 58.30 cm/s MV E/A ratio:  0.98 Mihai Croitoru MD Electronically signed by Sanda Klein MD Signature Date/Time: 02/17/2020/2:45:59 PM    Final      Assessment/Plan: Diagnosis: 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   Stroke: Continue secondary stroke prophylaxis and Risk Factor Modification listed below:   Antiplatelet therapy:   Blood Pressure Management:  Continue current medication with prn's with permisive HTN per primary  team Statin Agent:   Prediabetes management:   Right sided hemiparesis: fit for orthosis to prevent contractures (resting hand splint for day, wrist cock up splint at night, PRAFO, etc) PT/OT for mobility, ADL training  Labs and images (see above) independently reviewed.  Records reviewed and summated above.  1. Does the need for close, 24 hr/day medical supervision in concert with the patient's rehab needs make it unreasonable for this patient to be served in a less intensive setting? Yes  2. Co-Morbidities requiring supervision/potential complications: HTN (monitor and provide prns in accordance with increased physical exertion and pain), atypical carcinoid tumor, BPH with TURP (monitor for retention), remote smoker, prediabetes (SSI, monitor in accordance with exercise and adjust meds as necessary), dyslipidemia (Lipitor) 3. Due to bladder management, safety, disease management, medication administration and patient education, does the patient require 24 hr/day rehab nursing? Yes 4. Does the patient require coordinated care of a physician, rehab nurse, therapy disciplines of PT/OT/SLP to address physical and functional deficits in the context of the above medical diagnosis(es)? Yes Addressing deficits in the following areas: balance, endurance, locomotion, strength, transferring, bathing, dressing, speech and psychosocial support 5. Can the patient actively participate in an intensive therapy program of at least 3 hrs of therapy per day at least 5 days per week? Yes 6. The potential for patient to  make measurable gains while on inpatient rehab is excellent 7. Anticipated functional outcomes upon discharge from inpatient rehab are supervision and min assist  with PT, supervision and min assist with OT, modified independent with SLP. 8. Estimated rehab length of stay to reach the above functional goals is: 19-23 days. 9. Anticipated discharge destination: Home 10. Overall Rehab/Functional  Prognosis: good  RECOMMENDATIONS: This patient's condition is appropriate for continued rehabilitative care in the following setting: CIR Patient has agreed to participate in recommended program. Yes Note that insurance prior authorization may be required for reimbursement for recommended care.  Comment: Rehab Admissions Coordinator to follow up.  I have personally performed a face to face diagnostic evaluation, including, but not limited to relevant history and physical exam findings, of this patient and developed relevant assessment and plan.  Additionally, I have reviewed and concur with the physician assistant's documentation above.   Delice Lesch, MD, ABPMR Lavon Paganini Angiulli, PA-C 02/18/2020

## 2020-02-20 NOTE — H&P (Addendum)
Physical Medicine and Rehabilitation Admission H&P  CC: Left pontine CVA  HPI: Hector [name] is a 79 year old right-handed male with history of hypertension, atypical carcinoid tumor, BPH with TURP remote smoker.  He lives with spouse in [place] has been staying in the area with daughter after wife recent hysterectomy.  Independent prior to admission.  1 level home 2-3 steps to entry.  Presented 02/16/2020 with right side weakness and dysarthria.  Cranial CT scan showed few scattered hypoattenuating foci in the basal ganglia and left external capsule.  CT angiogram of head and neck no emergent large vessel occlusion.  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  No hemorrhage or mass-effect.  Echocardiogram with ejection fraction of 60 to 65% no wall motion abnormalities.  Admission chemistries hemoglobin 17.5, glucose 152, urinalysis negative nitrite, hemoglobin A1c 6.2, TSH 1.44.  Maintained on aspirin and Plavix x3 weeks for CVA prophylaxis followed by Plavix alone.  Subcutaneous   Tolerating a regular diet.  Therapy evaluations completed and patient was admitted for a comprehensive rehab program.  Review of Systems  Constitutional: Negative for chills and fever.  HENT: Negative for hearing loss.   Eyes: Negative for blurred vision and double vision.  Respiratory: Negative for cough and shortness of breath.   Cardiovascular: Negative for chest pain, palpitations and claudication.  Gastrointestinal: Positive for constipation. Negative for heartburn, nausea and vomiting.       GERD   Genitourinary: Positive for urgency.  Musculoskeletal: Positive for myalgias.  Skin: Negative for rash.  Neurological: Positive for sensory change, speech change and weakness.  Psychiatric/Behavioral:       Anxiety  All other systems reviewed and are negative.  Past Medical History:  Diagnosis Date  . Diverticulitis 04/2012   admission [facility]  . Former smoker    Smoked for  20 yrs;  Quit  25 yrs ago.  Marland Kitchen GERD (gastroesophageal reflux disease)   . Hypertension   . Hypokalemia   . Lung cancer ([place])   . 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 Polycythemia   . Prostatitis    S/P  TURP   Past Surgical History:  Procedure Laterality Date  . APPENDECTOMY  02/25/11  . COLONOSCOPY    . LOBECTOMY    . TRANSURETHRAL RESECTION OF PROSTATE  2004   Family History  Problem Relation Age of Onset  . Stroke Mother 40  . Heart disease Father 92  . Cancer Paternal Uncle        Carcinoma of unknown origin.  . Colon cancer Neg Hx    Social History:  reports that he quit smoking about 31 years ago. He has never used smokeless tobacco. He reports current alcohol use of about 7.0 standard drinks of alcohol per week. He reports that he does not use drugs. Allergies: No Known Allergies Medications Prior to Admission  Medication Sig Dispense Refill  . acetaminophen (TYLENOL) 500 MG tablet Take 500-1,000 mg by mouth every 6 (six) hours as needed for mild pain or headache.    Marland Kitchen aspirin 81 MG tablet Take 1 tablet (81 mg total) by mouth at bedtime for 21 days. 21 tablet 0  . atenolol (TENORMIN) 50 MG tablet TAKE ONE (1) TABLET EACH DAY (Patient taking differently: Take 50 mg by mouth daily. ) 90 tablet 1  . atorvastatin (LIPITOR) 20 MG tablet Take 1 tablet (20 mg total) by mouth daily. 30 tablet 0  . clopidogrel (PLAVIX) 75 MG tablet Take 1  tablet (75 mg total) by mouth daily. 30 tablet 0  . diazepam (VALIUM) 5 MG tablet Take 5 mg by mouth 2 (two) times daily as needed for anxiety.    . fluticasone (FLONASE) 50 MCG/ACT nasal spray Place 1-2 sprays into both nostrils daily as needed for allergies or rhinitis.    Marland Kitchen losartan (COZAAR) 25 MG tablet Take 25 mg by mouth at bedtime.     . sucralfate (CARAFATE) 1 g tablet Take 1 g by mouth in the morning and at bedtime.     . vitamin B-12 (CYANOCOBALAMIN) 1000 MCG tablet Take 1,000 mcg by mouth daily.        Drug Regimen  Review Drug regimen was reviewed and remains appropriate with no significant issues identified  Home: Home Living Family/patient expects to be discharged to:: Private residence Living Arrangements: Spouse/significant other, Children (wife just had hysterectomy) Available Help at Discharge: Family, Available PRN/intermittently (but daughter can be available 24/7 if needed) Type of Home: House Home Access: Stairs to enter [employer] of Steps: 2-3 STE no rails Entrance Stairs-Rails: None Home Layout: One level Bathroom Shower/Tub: Walk-in shower, Other (comment) (walkin is very small shower with no space for seat; has other bathroom with more space but would have to step over tub) Bathroom Toilet: Standard Bathroom Accessibility: Yes Home Equipment: None Additional Comments: patient/spouse life in [place] but are staying with daughter as wife recovers from surgery, OK to stay with daughter as he recovers too  Lives With: Spouse   Functional History: Prior Function Level of Independence: Independent Comments: still some mild dysarthria  Functional Status:  Mobility: Bed Mobility Overal bed mobility: Needs Assistance Bed Mobility: Supine to Sit, Sit to Supine Supine to sit: HOB elevated, Total assist Sit to supine: Max assist General bed mobility comments: able to get BLEs off EOB but then needed totalA to bring trunk up and scoot hips around to EOB; with return to bed, able to manage trunk but needed MaxA for BLEs Transfers Overall transfer level: Needs assistance Equipment used: Rolling Rachels (2 wheeled) Transfers: Sit to/from Stand Sit to Stand: Total assist, From elevated surface General transfer comment: able to perform partial stand from elevated surface, but unable to fully extend hips into standing position; minimal coordination/activation R LE Ambulation/Gait General Gait Details: unable  ADL:  Cognition: Cognition Overall Cognitive Status: Within  Functional Limits for tasks assessed Arousal/Alertness: Awake/alert Orientation Level: Oriented X4 Memory: Appears intact (recalled 5/5 words independently) Awareness: Appears intact Problem Solving: Appears intact Behaviors:  (pt became tearful during session several times) Safety/Judgment: Appears intact Cognition Arousal/Alertness: Awake/alert Behavior During Therapy: WFL for tasks assessed/performed Overall Cognitive Status: Within Functional Limits for tasks assessed   Physical Exam: There were no vitals taken for this visit. Physical Exam   General: Alert and oriented x 3, No apparent distress HEENT: Head is normocephalic, atraumatic, PERRLA, EOMI, sclera anicteric, oral mucosa pink and moist, dentition intact, ext ear canals clear,  Neck: Supple without JVD or lymphadenopathy Heart: Reg rate and rhythm. No murmurs rubs or gallops Chest: CTA bilaterally without wheezes, rales, or rhonchi; no distress Abdomen: Soft, non-tender, non-distended, bowel sounds positive. Extremities: No clubbing, cyanosis, or edema. Pulses are 2+ Skin: Clean and intact without signs of breakdown Neuro: Pt is cognitively appropriate with normal insight, memory, and awareness. Cranial nerves 2-12 are intact.  Motor function is grossly 5/5 on left side.  LUE: 2/5 EE, EF, 3/5 WE, 0/5 hand grip LLE: 4-/5 throughout with 3/5 DF/PF MSK: Right shoulder  with tenderness to palpation Psych: Pt's affect is appropriate. Pt is cooperative   Results for orders placed or performed during the hospital encounter of 02/16/20 (from the past 48 hour(s))  Basic metabolic panel     Status: Abnormal   Collection Time: 02/19/20  6:33 AM  Result Value Ref Range   Sodium 137 135 - 145 mmol/L   Potassium 3.7 3.5 - 5.1 mmol/L   Chloride 107 98 - 111 mmol/L   CO2 23 22 - 32 mmol/L   Glucose, Bld 122 (H) 70 - 99 mg/dL    Comment: Glucose reference range applies only to samples taken after fasting for at least 8 hours.    BUN 10 8 - 23 mg/dL   Creatinine, Ser 1.02 0.61 - 1.24 mg/dL   Calcium 8.9 8.9 - 10.3 mg/dL   GFR, Estimated >60 >60 mL/min   Anion gap 7 5 - 15    Comment: Performed at [facility] [address] [place], [place] [address]  CBC with Differential/Platelet     Status: None   Collection Time: 02/19/20  6:33 AM  Result Value Ref Range   WBC 6.3 4.0 - 10.5 K/uL   RBC 5.26 4.22 - 5.81 MIL/uL   Hemoglobin 16.2 13.0 - 17.0 g/dL   HCT 47.8 39 - 52 %   MCV 90.9 80.0 - 100.0 fL   MCH 30.8 26.0 - 34.0 pg   MCHC 33.9 30.0 - 36.0 g/dL   RDW 12.2 11.5 - 15.5 %   Platelets 179 150 - 400 K/uL   nRBC 0.0 0.0 - 0.2 %   Neutrophils Relative % 61 %   Neutro Abs 3.9 1.7 - 7.7 K/uL   Lymphocytes Relative 23 %   Lymphs Abs 1.5 0.7 - 4.0 K/uL   Monocytes Relative 9 %   Monocytes Absolute 0.6 0.1 - 1.0 K/uL   Eosinophils Relative 5 %   Eosinophils Absolute 0.3 0.0 - 0.5 K/uL   Basophils Relative 1 %   Basophils Absolute 0.0 0.0 - 0.1 K/uL   Immature Granulocytes 1 %   Abs Immature Granulocytes 0.03 0.00 - 0.07 K/uL    Comment: Performed at [facility], [address], [place], [place] [address]  Magnesium     Status: None   Collection Time: 02/19/20  6:33 AM  Result Value Ref Range   Magnesium 2.0 1.7 - 2.4 mg/dL    Comment: Performed at [facility] [address], [place], [place] [address]  Basic metabolic panel     Status: Abnormal   Collection Time: 02/20/20  1:04 AM  Result Value Ref Range   Sodium 138 135 - 145 mmol/L   Potassium 3.9 3.5 - 5.1 mmol/L   Chloride 109 98 - 111 mmol/L   CO2 20 (L) 22 - 32 mmol/L   Glucose, Bld 117 (H) 70 - 99 mg/dL    Comment: Glucose reference range applies only to samples taken after fasting for at least 8 hours.   BUN 13 8 - 23 mg/dL   Creatinine, Ser 1.04 0.61 - 1.24 mg/dL   Calcium 9.1 8.9 - 10.3 mg/dL   GFR, Estimated >60 >60 mL/min   Anion gap 9 5 - 15    Comment: Performed at [facility] [address],  [place], [place] [address]  CBC with Differential/Platelet     Status: None   Collection Time: 02/20/20  1:04 AM  Result Value Ref Range   WBC  7.5 4.0 - 10.5 K/uL   RBC 5.41 4.22 - 5.81 MIL/uL   Hemoglobin 16.5 13.0 - 17.0 g/dL   HCT 49.0 39 - 52 %   MCV 90.6 80.0 - 100.0 fL   MCH 30.5 26.0 - 34.0 pg   MCHC 33.7 30.0 - 36.0 g/dL   RDW 12.2 11.5 - 15.5 %   Platelets 169 150 - 400 K/uL   nRBC 0.0 0.0 - 0.2 %   Neutrophils Relative % 59 %   Neutro Abs 4.5 1.7 - 7.7 K/uL   Lymphocytes Relative 24 %   Lymphs Abs 1.8 0.7 - 4.0 K/uL   Monocytes Relative 10 %   Monocytes Absolute 0.7 0.1 - 1.0 K/uL   Eosinophils Relative 5 %   Eosinophils Absolute 0.4 0.0 - 0.5 K/uL   Basophils Relative 1 %   Basophils Absolute 0.1 0.0 - 0.1 K/uL   Immature Granulocytes 1 %   Abs Immature Granulocytes 0.05 0.00 - 0.07 K/uL    Comment: Performed at [facility] [address], [place], [place] [address]  Magnesium     Status: None   Collection Time: 02/20/20  1:04 AM  Result Value Ref Range   Magnesium 2.0 1.7 - 2.4 mg/dL    Comment: Performed at [facility] [address], [place], [place] [address]   No results found.     Medical Problem List and Plan: 1.  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  Antithrombotics: -DVT/anticoagulation:    -antiplatelet therapy: Aspirin 81 mg daily and Plavix 75 mg daily x3 weeks followed by Plavix alone 3. Pain Management: Tylenol as needed. Well controlled 4. Mood: Provide emotional support  -antipsychotic agents: N/A 5. Neuropsych: This patient is capable of making decisions on his own behalf. 6. Skin/Wound Care: Routine skin checks. May d/c IV 7. Fluids/Electrolytes/Nutrition: Routine in and outs with follow-up chemistries. BMP stable on 10/15 8.  Permissive hypertension. Tenormin 50 mg daily, Cozaar  25 mg daily 9.  Malignant neoplasm left lung.  Followed at [facility] oncology 10.  BPH with TURP.  Check PVR 11.  Hyperlipidemia.  Lipitor 12. Moving bowels regularly.   Lavon Paganini Angiulli, PA-C  I have personally performed a face to face diagnostic evaluation, including, but not limited to relevant history and physical exam findings, of this patient and developed relevant assessment and plan.  Additionally, I have reviewed and concur with the physician assistant's documentation above.  Leeroy Cha, MD

## 2020-02-21 ENCOUNTER — Inpatient Hospital Stay (HOSPITAL_COMMUNITY): Payer: Medicare PPO | Admitting: Speech Pathology

## 2020-02-21 ENCOUNTER — Inpatient Hospital Stay (HOSPITAL_COMMUNITY): Payer: Medicare PPO | Admitting: Physical Therapy

## 2020-02-21 ENCOUNTER — Inpatient Hospital Stay (HOSPITAL_COMMUNITY): Payer: Medicare PPO

## 2020-02-21 DIAGNOSIS — I639 Cerebral infarction, unspecified: Secondary | ICD-10-CM

## 2020-02-21 MED ORDER — LOSARTAN POTASSIUM 50 MG PO TABS
50.0000 mg | ORAL_TABLET | Freq: Every day | ORAL | Status: DC
  Administered 2020-02-22 – 2020-03-11 (×19): 50 mg via ORAL
  Filled 2020-02-21 (×19): qty 1

## 2020-02-21 NOTE — Plan of Care (Signed)
°  Problem: RH Balance Goal: LTG Patient will maintain dynamic sitting balance (PT) Description: LTG:  Patient will maintain dynamic sitting balance with assistance during mobility activities (PT) Flowsheets (Taken 02/21/2020 1559) LTG: Pt will maintain dynamic sitting balance during mobility activities with:: Independent with assistive device  Goal: LTG Patient will maintain dynamic standing balance (PT) Description: LTG:  Patient will maintain dynamic standing balance with assistance during mobility activities (PT) Flowsheets (Taken 02/21/2020 1559) LTG: Pt will maintain dynamic standing balance during mobility activities with:: Supervision/Verbal cueing   Problem: RH Bed Mobility Goal: LTG Patient will perform bed mobility with assist (PT) Description: LTG: Patient will perform bed mobility with assistance, with/without cues (PT). Flowsheets (Taken 02/21/2020 1559) LTG: Pt will perform bed mobility with assistance level of: Supervision/Verbal cueing   Problem: RH Bed to Chair Transfers Goal: LTG Patient will perform bed/chair transfers w/assist (PT) Description: LTG: Patient will perform bed to chair transfers with assistance (PT). Flowsheets (Taken 02/21/2020 1559) LTG: Pt will perform Bed to Chair Transfers with assistance level: Supervision/Verbal cueing   Problem: RH Car Transfers Goal: LTG Patient will perform car transfers with assist (PT) Description: LTG: Patient will perform car transfers with assistance (PT). Flowsheets (Taken 02/21/2020 1559) LTG: Pt will perform car transfers with assist:: Supervision/Verbal cueing   Problem: RH Ambulation Goal: LTG Patient will ambulate in controlled environment (PT) Description: LTG: Patient will ambulate in a controlled environment, # of feet with assistance (PT). Flowsheets (Taken 02/21/2020 1559) LTG: Pt will ambulate in controlled environ  assist needed:: Supervision/Verbal cueing LTG: Ambulation distance in controlled  environment: 163ft with LRAD Goal: LTG Patient will ambulate in home environment (PT) Description: LTG: Patient will ambulate in home environment, # of feet with assistance (PT). Flowsheets (Taken 02/21/2020 1559) LTG: Pt will ambulate in home environ  assist needed:: Supervision/Verbal cueing LTG: Ambulation distance in home environment: 46ft with LRAD   Problem: RH Wheelchair Mobility Goal: LTG Patient will propel w/c in controlled environment (PT) Description: LTG: Patient will propel wheelchair in controlled environment, # of feet with assist (PT) Flowsheets (Taken 02/21/2020 1559) LTG: Pt will propel w/c in controlled environ  assist needed:: Supervision/Verbal cueing LTG: Propel w/c distance in controlled environment: 155ft   Problem: RH Stairs Goal: LTG Patient will ambulate up and down stairs w/assist (PT) Description: LTG: Patient will ambulate up and down # of stairs with assistance (PT) Flowsheets (Taken 02/21/2020 1559) LTG: Pt will ambulate up/down stairs assist needed:: Contact Guard/Touching assist LTG: Pt will  ambulate up and down number of stairs: 3 steps without rail to enter house

## 2020-02-21 NOTE — Progress Notes (Signed)
[place] PHYSICAL MEDICINE & REHABILITATION PROGRESS NOTE   Subjective/Complaints: Patient without complaints today.  Daughter is at bedside.  Patient gives history that he was staying with daughter because his wife had a hysterectomy and he plans to go back to [facility] once he is more independent.  He has a history of left lower lobe resection due to lung cancer. He has no pain complaints today He moved his bowels this morning and is voiding well Review of systems negative for chest pain shortness of breath nausea vomiting diarrhea or constipation.   Objective:   No results found. Recent Labs    02/19/20 0633 02/20/20 0104  WBC 6.3 7.5  HGB 16.2 16.5  HCT 47.8 49.0  PLT 179 169   Recent Labs    02/19/20 0633 02/20/20 0104  NA 137 138  K 3.7 3.9  CL 107 109  CO2 23 20*  GLUCOSE 122* 117*  BUN 10 13  CREATININE 1.02 1.04  CALCIUM 8.9 9.1    Intake/Output Summary (Last 24 hours) at 02/21/2020 1303 Last data filed at 02/21/2020 0746 Gross per 24 hour  Intake 1035 ml  Output 1350 ml  Net -315 ml        Physical Exam: Vital Signs Blood pressure (!) 163/86, pulse 70, temperature 98.4 F (36.9 C), temperature source Oral, resp. rate 18, SpO2 97 %.  General: No acute distress Mood and affect are appropriate Heart: Regular rate and rhythm no rubs murmurs or extra sounds Lungs: Clear to auscultation, decreased breath sounds left base, breathing unlabored, no rales or wheezes Abdomen: Positive bowel sounds, soft nontender to palpation, nondistended Extremities: No clubbing, cyanosis, or edema Skin: No evidence of breakdown, no evidence of rash Neurologic: Cranial nerves II through XII intact, motor strength is 5/5 in left deltoid, bicep, tricep, grip, hip flexor, knee extensors, ankle dorsiflexor and plantar flexor 3 - in the right deltoid bicep tricep trace right wrist extensor to minus finger flexors, 4 - right hip flexors and knee extensors 0 at the ankle  dorsiflexors and plantar flexors as well as toe flexors and extensors on the right side.  Sensory exam normal sensation to light touch in bilateral upper and lower extremities Cerebellar exam deferred on right side due to weakness in the right upper extremity musculoskeletal: Full range of motion in all 4 extremities. No joint swelling   Assessment/Plan: 1. 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  Physiatrist is providing close team supervision and 24 hour management of active medical problems listed below.  Physiatrist and rehab team continue to assess barriers to discharge/monitor patient progress toward functional and medical goals  Care Tool:  Bathing  Bathing activity did not occur:  (Declined on this date, however agreeable to bathing at next OT session)           Bathing assist       Upper Body Dressing/Undressing Upper body dressing   What is the patient wearing?: Pull over shirt    Upper body assist Assist Level: Maximal Assistance - Patient 25 - 49%    Lower Body Dressing/Undressing Lower body dressing      What is the patient wearing?: Pants, Underwear/pull up     Lower body assist Assist for lower body dressing: Maximal Assistance - Patient 25 - 49%     Toileting Toileting Toileting Activity did not occur (Clothing management and hygiene only): N/A (no void or  bm)  Toileting assist Assist for toileting: 2 Helpers     Transfers Chair/bed transfer  Transfers assist     Chair/bed transfer assist level: Moderate Assistance - Patient 50 - 74%     Locomotion Ambulation   Ambulation assist              Walk 10 feet activity   Assist           Walk 50 feet activity   Assist           Walk 150 feet activity   Assist           Walk 10 feet on uneven surface  activity   Assist            Wheelchair     Assist               Wheelchair 50 feet with 2 turns activity    Assist            Wheelchair 150 feet activity     Assist          Blood pressure (!) 163/86, pulse 70, temperature 98.4 F (36.9 C), temperature source Oral, resp. rate 18, SpO2 97 %.  Medical Problem List and Plan: 1.  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  Antithrombotics: -DVT/anticoagulation:               -antiplatelet therapy: Aspirin 81 mg daily and Plavix 75 mg daily x3 weeks followed by Plavix alone 3. Pain Management: Tylenol as needed. Well controlled 4. Mood: Provide emotional support             -antipsychotic agents: N/A 5. Neuropsych: This patient is capable of making decisions on his own behalf. 6. Skin/Wound Care: Routine skin checks. May d/c IV 7. Fluids/Electrolytes/Nutrition: Routine in and outs with follow-up chemistries. BMP stable on 10/15 8.  Permissive hypertension. Tenormin 50 mg daily, Cozaar 25 mg daily Vitals:   02/21/20 0525 02/21/20 0718  BP: (!) 149/81 (!) 163/86  Pulse: 60 70  Resp: 18   Temp: 98.4 F (36.9 C)   SpO2: 96% 97%  Will increase Cozaar to 50mg  on 10/17 9.  Malignant neoplasm left lung.  Status post left lower lobe resection Followed at [facility] oncology 10.  BPH with TURP.  Check PVR 11.  Hyperlipidemia.  Lipitor 12. Moving bowels regularly.     LOS: 1 days A FACE TO FACE EVALUATION WAS PERFORMED  Charlett Blake 02/21/2020, 1:03 PM

## 2020-02-21 NOTE — Evaluation (Signed)
Speech Language Pathology Assessment and Plan  Patient Details  Name: Hector [name] MRN: [id number] Date of Birth: 03/01/1941  SLP Diagnosis: Dysarthria  Rehab Potential: Excellent ELOS: 1-2 weeks for SLP    Today's Date: 02/21/2020 SLP Individual Time: 0800-0855 SLP Individual Time Calculation (min): 55 min   Hospital Problem: Principal Problem:   Left pontine cerebrovascular accident [facility])  Past Medical History:  Past Medical History:  Diagnosis Date  . Diverticulitis 04/2012   admission [facility]  . Former smoker    Smoked for 20 yrs;  Quit  25 yrs ago.  Marland Kitchen GERD (gastroesophageal reflux disease)   . Hypertension   . Hypokalemia   . Lung cancer ([place])   . 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 Polycythemia   . Prostatitis    S/P  TURP   Past Surgical History:  Past Surgical History:  Procedure Laterality Date  . APPENDECTOMY  02/25/11  . COLONOSCOPY    . LOBECTOMY    . TRANSURETHRAL RESECTION OF PROSTATE  2004    Assessment / Plan / Recommendation Clinical Impression Patient is a 79 y.o.right-handed malewith history of hypertension, atypical carcinoid tumor, BPH with TURP, remote smoker.History taken from chart review and patient. Patient lives with spouse in [place] staying in the area with daughter after wife's recent hysterectomy. Independent prior to admission. He presented on 02/16/2020 with right hemiparesis and dysarthria.Cranial CT scan showed few scattered hypoattenuating foci in thebasal ganglia and left external capsule.CT angiogram of head and neck no emergent large vessel occlusion. 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 No hemorrhage or mass-effect. Echocardiogram with ejection fraction of 60-65%, nowall motion abnormalities.Hospital course further complicated by prediabetes. Currently maintained on aspirin and Plavix for CVA prophylaxis x3 weeks and aspirin alone. Subcutaneous Lovenox  for DVT prophylaxis. Tolerating regular diet. Therapy evaluations completed with recommendations of physical medicine rehab consult. Patient admitted to [facility] 02/20/20.  Patient administered the cognistat and scored WFL on all subtests with all cognitive-linguistic functioning also appearing WFL during informal assessment. Patient also reported his overall cognitive-linguistic functioning is at baseline. Patient demonstrates a mild dysarthria characterized by imprecise consonants due to decreased labial closure with plosive sounds as well as  inadequate palatal elevation with deviation. Patient would benefit from skilled SLP intervention to maximize his speech intelligibility prior to discharge.     Skilled Therapeutic Interventions          Administered a cognitive-linguistic evaluation, please see above for details. Educated patient regarding speech intelligibility strategies and tasks he can perform independently to practice utilizing strategies. Patient verbalized understanding and a visual aid was also created. Patient left upright in bed with alarm on and all needs within reach.    SLP Assessment  Patient will need skilled [facility] during CIR admission    Recommendations  Oral Care Recommendations: Oral care BID Recommendations for Other Services: Neuropsych consult Patient destination: Home Follow up Recommendations: None Equipment Recommended: None recommended by SLP    SLP Frequency 1 to 3 out of 7 days   SLP Duration  SLP Intensity  SLP Treatment/Interventions 1-2 weeks for SLP  Minumum of 1-2 x/day, 30 to 90 minutes  Speech/Language facilitation;Internal/external aids;Cueing hierarchy;Environmental controls;Therapeutic Activities;Patient/family education;Functional tasks    Pain Pain Assessment Pain Scale: 0-10 Pain Score: 0-No pain  Prior Functioning Type of Home: House  Lives With: Spouse Available Help at Discharge: Family;Available  PRN/intermittently Vocation: Retired  SLP Evaluation Cognition Overall Cognitive Status: Within Functional  Limits for tasks assessed Arousal/Alertness: Awake/alert Attention: Alternating Memory: Appears intact Immediate Memory Recall: Sock;Blue;Bed Memory Recall Blue: Without Cue Memory Recall Bed: Without Cue Awareness: Appears intact Problem Solving: Appears intact Safety/Judgment: Appears intact  Comprehension Auditory Comprehension Overall Auditory Comprehension: Appears within functional limits for tasks assessed Expression Expression Primary Mode of Expression: Verbal Verbal Expression Overall Verbal Expression: Appears within functional limits for tasks assessed Oral Motor Oral Motor/Sensory Function Overall Oral Motor/Sensory Function: Mild impairment Facial ROM: Within Functional Limits Facial Symmetry: Within Functional Limits Facial Strength: Reduced right;Other (Comment) Facial Sensation: Within Functional Limits Lingual ROM: Reduced right Lingual Symmetry: Within Functional Limits Lingual Strength: Reduced Velum: Impaired right Motor Speech Overall Motor Speech: Impaired Respiration: Impaired Level of Impairment: Sentence Phonation: Normal Resonance: Within functional limits Articulation: Impaired Phrase: 75-100% accurate Sentence: 75-100% accurate Conversation: 75-100% accurate Motor Planning: Witnin functional limits Effective Techniques: Slow rate;Over-articulate  Care Tool Care Tool Cognition Expression of Ideas and Wants Expression of Ideas and Wants: Without difficulty (complex and basic) - expresses complex messages without difficulty and with speech that is clear and easy to understand   Understanding Verbal and Non-Verbal Content Understanding Verbal and Non-Verbal Content: Understands (complex and basic) - clear comprehension without cues or repetitions   Memory/Recall Ability *first 3 days only Memory/Recall Ability *first 3 days only:  Current season;Location of own room;That he or she is in a hospital/hospital unit;Staff names and faces    Short Term Goals: Week 1: SLP Short Term Goal 1 (Week 1): Patient will utilize speech intelligibility strategies at the sentence level in a moderately noisy enviornment to achieve ~95% intelligibility with supervision verbal cues.  Refer to Care Plan for Long Term Goals  Recommendations for other services: Neuropsych  Discharge Criteria: Patient will be discharged from SLP if patient refuses treatment 3 consecutive times without medical reason, if treatment goals not met, if there is a change in medical status, if patient makes no progress towards goals or if patient is discharged from hospital.  The above assessment, treatment plan, treatment alternatives and goals were discussed and mutually agreed upon: by patient  Ashby Leflore 02/21/2020, 1:49 PM

## 2020-02-21 NOTE — Evaluation (Signed)
Physical Therapy Assessment and Plan  Patient Details  Name: Hector [name] MRN: [id number] Date of Birth: 04/19/1941  PT Diagnosis: Abnormal posture, Abnormality of gait, Ataxia, Ataxic gait, Coordination disorder, Hemiplegia dominant, Impaired sensation and Muscle weakness Rehab Potential: Good ELOS: 18-21 days   Today's Date: 02/21/2020 PT Individual Time: 1500-1530 AND 1500-1530 PT Individual Time Calculation (min): 30 min  And 30 min   Hospital Problem: Principal Problem:   Left pontine cerebrovascular accident [facility])   Past Medical History:  Past Medical History:  Diagnosis Date  . Diverticulitis 04/2012   admission [facility]  . Former smoker    Smoked for 20 yrs;  Quit  25 yrs ago.  Marland Kitchen GERD (gastroesophageal reflux disease)   . Hypertension   . Hypokalemia   . Lung cancer ([place])   . 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 Polycythemia   . Prostatitis    S/P  TURP   Past Surgical History:  Past Surgical History:  Procedure Laterality Date  . APPENDECTOMY  02/25/11  . COLONOSCOPY    . LOBECTOMY    . TRANSURETHRAL RESECTION OF PROSTATE  2004    Assessment & Plan Clinical Impression: Patient is a 79 year old right-handed male with history of hypertension, atypical carcinoid tumor, BPH with TURP remote smoker.  He lives with spouse in [place] has been staying in the area with daughter after wife recent hysterectomy.  Independent prior to admission.  1 level home 2-3 steps to entry.  Presented 02/16/2020 with right side weakness and dysarthria.  Cranial CT scan showed few scattered hypoattenuating foci in the basal ganglia and left external capsule.  CT angiogram of head and neck no emergent large vessel occlusion.  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  No hemorrhage or mass-effect.  Echocardiogram with ejection fraction of 60 to 65% no wall motion abnormalities.  Admission chemistries hemoglobin 17.5, glucose 152, urinalysis negative  nitrite, hemoglobin A1c 6.2, TSH 1.44.  Patient transferred to CIR on 02/20/2020 .   Patient currently requires max with mobility secondary to muscle weakness and muscle joint tightness, decreased cardiorespiratoy endurance, impaired timing and sequencing, abnormal tone, unbalanced muscle activation, motor apraxia, ataxia and decreased coordination, decreased motor planning and decreased sitting balance, decreased standing balance, decreased postural control, hemiplegia and decreased balance strategies.  Prior to hospitalization, patient was independent  with mobility and lived with Spouse in a House home.  Home access is 2-3 STE no railsStairs to enter.  Patient will benefit from skilled PT intervention to maximize safe functional mobility, minimize fall risk and decrease caregiver burden for planned discharge home with 24 hour supervision.  Anticipate patient will benefit from follow up [place] at discharge.  PT - End of Session Activity Tolerance: Tolerates 10 - 20 min activity with multiple rests Endurance Deficit: Yes PT Assessment Rehab Potential (ACUTE/IP ONLY): Good PT Barriers to Discharge: [place] home environment;Decreased caregiver support;Home environment access/layout;Incontinence;Insurance for SNF coverage PT Patient demonstrates impairments in the following area(s): Balance;Endurance;Motor;Nutrition;Pain;Perception;Safety PT Transfers Functional Problem(s): Bed Mobility;Bed to Chair;Car;Furniture;Floor PT Locomotion Functional Problem(s): Ambulation;Wheelchair Mobility;Stairs PT Plan PT Intensity: Minimum of 1-2 x/day ,45 to 90 minutes PT Frequency: 5 out of 7 days PT Duration Estimated Length of Stay: 18-21 days PT Treatment/Interventions: Ambulation/gait training;Discharge planning;Functional mobility training;Psychosocial support;Therapeutic Activities;Visual/perceptual remediation/compensation;Wheelchair propulsion/positioning;Therapeutic Exercise;Neuromuscular  re-education;Skin care/wound management;Disease management/prevention;Balance/vestibular training;Cognitive remediation/compensation;DME/adaptive equipment instruction;Pain management;Splinting/orthotics;UE/LE Strength taining/ROM;Community reintegration;Functional electrical stimulation;Patient/family education;Stair training;UE/LE Coordination activities PT Transfers Anticipated Outcome(s): Supervision assist with LRAD PT Locomotion Anticipated Outcome(s): Supervision assist  ambulatory with LRAD at house hold level PT Recommendation Recommendations for Other Services: Therapeutic Recreation consult;Neuropsych consult Therapeutic Recreation Interventions: Outing/community reintergration;Stress management Follow Up Recommendations: Home health PT Patient destination: Home Equipment Recommended: [place] with 5" wheels;To be determined   PT Evaluation Precautions/Restrictions Precautions Precautions: Fall Precaution Comments: R-sided weakness  Pain Pain Assessment Pain Scale: 0-10 Pain Score: 0-No pain Home Living/Prior Functioning Home Living Available Help at Discharge: Family;Available PRN/intermittently Type of Home: House Home Access: Stairs to enter Entrance Stairs-Number of Steps: 2-3 STE no rails Entrance Stairs-Rails: None Home Layout: One level Bathroom Shower/Tub: Walk-in shower;Other (comment) Bathroom Toilet: Standard Bathroom Accessibility: Yes Additional Comments: patient/spouse life in [place] but are staying with daughter as wife recovers from surgery, OK to stay with daughter as he recovers too  Lives With: Spouse Prior Function Level of Independence: Independent with basic ADLs;Independent with transfers Driving: Yes Vocation: Retired Comments: still some mild dysarthria -  does a lot of walking, fishing, drives tractor runs a chainsaw to fix property Vision/Perception  Perception Perception: Within Functional Limits Praxis Praxis: Impaired Praxis  Impairment Details: Motor planning Praxis-Other Comments: RLE  Cognition Overall Cognitive Status: Within Functional Limits for tasks assessed Arousal/Alertness: Awake/alert Attention: Alternating Memory: Appears intact Immediate Memory Recall: Sock;Blue;Bed Memory Recall Blue: Without Cue Memory Recall Bed: Without Cue Awareness: Appears intact Problem Solving: Appears intact Safety/Judgment: Appears intact Sensation Sensation Light Touch: Impaired by gross assessment Proprioception: Impaired Detail Proprioception Impaired Details: Impaired RLE;Impaired RUE Additional Comments: mild baseline neuropathy with mild parathesia Coordination Gross Motor Movements are Fluid and Coordinated: No Fine Motor Movements are Fluid and Coordinated: No Coordination and Movement Description: RUE impaired due to weakness Heel Shin Test: impaired due to [employer] 9 Hole Peg Test: unable to complete with RUE Motor  Motor Motor: Hemiplegia Motor - Skilled Clinical Observations: RUE/RLE   Trunk/Postural Assessment  Cervical Assessment Cervical Assessment: Within Functional Limits Thoracic Assessment Thoracic Assessment: Within Functional Limits Lumbar Assessment Lumbar Assessment: Within Functional Limits Postural Control Postural Control: Deficits on evaluation  Balance Balance Balance Assessed: Yes Static Sitting Balance Static Sitting - Balance Support: Bilateral upper extremity supported;Feet supported Static Sitting - Level of Assistance: 7: Independent Dynamic Sitting Balance Dynamic Sitting - Balance Support: No upper extremity supported;During functional activity;Feet supported Dynamic Sitting - Level of Assistance: 5: Stand by assistance Static Standing Balance Static Standing - Balance Support: Left upper extremity supported Static Standing - Level of Assistance: 4: Min assist Dynamic Standing Balance Dynamic Standing - Balance Support: During functional activity Dynamic  Standing - Level of Assistance: 3: Mod assist Extremity Assessment  RUE Assessment RUE Assessment: Exceptions to [facility] Passive Range of Motion (PROM) Comments: WFL Active Range of Motion (AROM) Comments: Impaired overall- trace movement in digits for the first time today General Strength Comments: shoulder flexion 2+/5, biceps/triceps 2+/5, wrist 1/5 LUE Assessment LUE Assessment: Within Functional Limits RLE Assessment RLE Assessment: Exceptions to [facility] General Strength Comments: DF 1/5. all others 3+/5 to 4-/5 proximal to distal LLE Assessment LLE Assessment: Within Functional Limits General Strength Comments: grossly 5/5  Care Tool Care Tool Bed Mobility Roll left and right activity   Roll left and right assist level: Moderate Assistance - Patient 50 - 74%    Sit to lying activity   Sit to lying assist level: Moderate Assistance - Patient 50 - 74%    Lying to sitting edge of bed activity   Lying to sitting edge of bed assist level: Moderate Assistance - Patient 50 - 74%  Care Tool Transfers Sit to stand transfer   Sit to stand assist level: Moderate Assistance - Patient 50 - 74%    Chair/bed transfer   Chair/bed transfer assist level: Maximal Assistance - Patient 25 - 49%     Toilet transfer   Assist Level: Moderate Assistance - Patient 50 - 74%    Car transfer   Car transfer assist level: Maximal Assistance - Patient 25 - 49%      Care Tool Locomotion Ambulation   Assist level: Maximal Assistance - Patient 25 - 49% Assistive device: Hand held assist Max distance: 15  Walk 10 feet activity   Assist level: Maximal Assistance - Patient 25 - 49%     Walk 50 feet with 2 turns activity Walk 50 feet with 2 turns activity did not occur: Safety/medical concerns      Walk 150 feet activity Walk 150 feet activity did not occur: Safety/medical concerns      Walk 10 feet on uneven surfaces activity Walk 10 feet on uneven surfaces activity did not occur: Safety/medical  concerns      Stairs Stair activity did not occur: Safety/medical concerns        Walk up/down 1 step activity Walk up/down 1 step or curb (drop down) activity did not occur: Safety/medical concerns     Walk up/down 4 steps activity did not occuR: Safety/medical concerns  Walk up/down 4 steps activity      Walk up/down 12 steps activity Walk up/down 12 steps activity did not occur: Safety/medical concerns      Pick up small objects from floor Pick up small object from the floor (from standing position) activity did not occur: Safety/medical concerns      Wheelchair   Type of Wheelchair: Manual   Wheelchair assist level: Minimal Assistance - Patient > 75% Max wheelchair distance: 150  Wheel 50 feet with 2 turns activity   Assist Level: Minimal Assistance - Patient > 75%  Wheel 150 feet activity   Assist Level: Minimal Assistance - Patient > 75%    Refer to Care Plan for Long Term Goals  SHORT TERM GOAL WEEK 1 PT Short Term Goal 1 (Week 1): Pt will transfer to and from [facility] with min assist PT Short Term Goal 2 (Week 1): Pt will ambulate 92ft with mod assist and LRAD PT Short Term Goal 3 (Week 1): Pt will Propell WC with supervision assist x 142ft PT Short Term Goal 4 (Week 1): Pt will consistenly perform bed mobility with CGA  Recommendations for other services: Neuropsych and Therapeutic Recreation  Stress management and Outing/community reintegration  Skilled Therapeutic Intervention Session 1  Pt received sitting in WC and agreeable to PT. PT instructed patient in PT Evaluation and initiated treatment intervention; see above for results. PT educated patient in POC, rehab potential, rehab goals, and discharge recommendations. Pt performed gait training without AD and then with RW. Max assist without AD and max-mod assist to facilitate improved step width R as well as terminal knee extension to prevent knee collapse. Noted to have intermittent GR with knee extension. Car  transfer performed with max assist and No AD. Pt returned to room and performed stand pivot transfer to bed with mod assist to block the RLE and UE support on bed rail. Sit>supine completed with min assist at the RLE, and left supine in bed with call bell in reach and all needs met.   Session 2.  Pt received supine in bed and agreeable to PT. Supine>sit  transfer with min-mod assist and cues for trunkal sequencing and use of RUE. Stand pivot transfer to [facility] with mod assist and RLE blocked. Pt performed gait training with RW x 33f with mod-max assist due to poor knee control on the RLE and resistance to assistance from PT for adequate knee extension.  Kinetron 2 x 2 min with 1 min break between bouts. Cues for improved anterior weight shift and reciprocal movement pattern. [facility] returned to [facility] and all needs met.        Mobility Bed Mobility Bed Mobility: Supine to Sit Supine to Sit: Minimal Assistance - Patient > 75% Transfers Transfers: Sit to Stand;Stand Pivot Transfers Sit to Stand: Minimal Assistance - Patient > 75% Stand Pivot Transfers: Moderate Assistance - Patient 50 - 74% Stand Pivot Transfer Details: Tactile cues for weight shifting;Verbal cues for technique;Manual facilitation for weight shifting Transfer (Assistive device): None Locomotion  Gait Ambulation: Yes Gait Assistance: Maximal Assistance - Patient 25-49% Gait Distance (Feet): 15 Feet Assistive device: None;1 person hand held assist Gait Assistance Details: arm over PT shoulder Stairs / Additional Locomotion Stairs: Yes Stairs Assistance: Maximal Assistance - Patient 25 - 49% Stair Management Technique: One rail Left Number of Stairs: 1 Height of Stairs: 3 Wheelchair Mobility Wheelchair Mobility: Yes Wheelchair Assistance: Minimal assistance - Patient >75% Wheelchair Propulsion: Left upper extremity;Left lower extremity Wheelchair Parts Management: Needs  assistance Distance: 150   Discharge Criteria: Patient will be discharged from PT if patient refuses treatment 3 consecutive times without medical reason, if treatment goals not met, if there is a change in medical status, if patient makes no progress towards goals or if patient is discharged from hospital.  The above assessment, treatment plan, treatment alternatives and goals were discussed and mutually agreed upon: by patient  ALorie Phenix10/16/2021, 3:51 PM

## 2020-02-21 NOTE — Evaluation (Signed)
Occupational Therapy Assessment and Plan  Patient Details  Name: Hector [name] MRN: [id number] Date of Birth: [birth year]  OT Diagnosis: hemiplegia affecting dominant side and muscle weakness (generalized) Rehab Potential: Rehab Potential (ACUTE ONLY): Good ELOS: 16-21 days   Today's Date: 02/21/2020 OT Individual Time: 1000-1100 OT Individual Time Calculation (min): 60 min     Hospital Problem: Principal Problem:   Left pontine cerebrovascular accident [facility])   Past Medical History:  Past Medical History:  Diagnosis Date  . Diverticulitis 04/2012   admission [facility]  . Former smoker    Smoked for 20 yrs;  Quit  25 yrs ago.  Marland Kitchen GERD (gastroesophageal reflux disease)   . Hypertension   . Hypokalemia   . Lung cancer ([place])   . 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 Polycythemia   . Prostatitis    S/P  TURP   Past Surgical History:  Past Surgical History:  Procedure Laterality Date  . APPENDECTOMY  02/25/11  . COLONOSCOPY    . LOBECTOMY    . TRANSURETHRAL RESECTION OF PROSTATE  2004    Assessment & Plan Clinical Impression: Hector [name]. Canion is a 79 year old right-handed male with history of hypertension, atypical carcinoid tumor, BPH with TURP remote smoker.  He lives with spouse in [place] has been staying in the area with daughter after wife recent hysterectomy.  Independent prior to admission.  1 level home 2-3 steps to entry.  Presented 02/16/2020 with right side weakness and dysarthria.  Cranial CT scan showed few scattered hypoattenuating foci in the basal ganglia and left external capsule.  CT angiogram of head and neck no emergent large vessel occlusion.  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  No hemorrhage or mass-effect.  Echocardiogram with ejection fraction of 60 to 65% no wall motion abnormalities.  Admission chemistries hemoglobin 17.5, glucose 152, urinalysis negative nitrite, hemoglobin A1c 6.2, TSH 1.44.  Maintained on  aspirin and Plavix x3 weeks for CVA prophylaxis followed by Plavix alone.  Subcutaneous   Tolerating a regular diet Patient transferred to CIR on 02/20/2020 .    Patient currently requires mod-max assist with basic self-care skills secondary to muscle weakness, decreased cardiorespiratoy endurance, motor apraxia, decreased coordination and decreased motor planning and decreased standing balance, decreased postural control, hemiplegia and decreased balance strategies.  Prior to hospitalization, patient could complete BADLs and IADLs with modified independent .  Patient will benefit from skilled intervention to increase independence with basic self-care skills prior to discharge home with care partner.  Anticipate patient will require 24 hour supervision and follow up outpatient.  OT - End of Session Activity Tolerance: Decreased this session Endurance Deficit: Yes OT Assessment Rehab Potential (ACUTE ONLY): Good OT Patient demonstrates impairments in the following area(s): Balance;Safety;Sensory;Endurance;Motor;Perception;Edema;Cognition OT Basic ADL's Functional Problem(s): Grooming;Bathing;Dressing;Toileting;Eating OT Transfers Functional Problem(s): Toilet;Tub/Shower OT Additional Impairment(s): Fuctional Use of Upper Extremity OT Plan OT Intensity: Minimum of 1-2 x/day, 45 to 90 minutes OT Frequency: 5 out of 7 days OT Duration/Estimated Length of Stay: 16-21 days OT Treatment/Interventions: Balance/vestibular training;Neuromuscular re-education;Disease mangement/prevention;Self Care/advanced ADL retraining;Therapeutic Exercise;Wheelchair propulsion/positioning;Cognitive remediation/compensation;DME/adaptive equipment instruction;Pain management;Skin care/wound managment;UE/LE Strength taining/ROM;Community reintegration;Functional electrical stimulation;Patient/family education;Splinting/orthotics;UE/LE Coordination activities;Discharge planning;Functional mobility training;Psychosocial  support;Therapeutic Activities;Visual/perceptual remediation/compensation OT Self Feeding Anticipated Outcome(s): Mod I OT Basic Self-Care Anticipated Outcome(s): Supervision OT Toileting Anticipated Outcome(s): Supervision OT Bathroom Transfers Anticipated Outcome(s): Supervision OT Recommendation Recommendations for Other Services: Therapeutic Recreation consult Patient destination: Home Follow Up Recommendations: Outpatient OT Equipment Recommended: To be determined  OT Evaluation Precautions/Restrictions  Precautions Precautions: Fall Precaution Comments: R-sided weakness General   Vital Signs  Pain Pain Assessment Pain Scale: 0-10 Pain Score: 0-No pain Home Living/Prior Functioning Home Living Available Help at Discharge: Family Type of Home: House Home Access: Stairs to enter [employer] of Steps: 2-3 Additional Comments: Pt to return to daughter's home [place] with hopes of returning to home in [place].  Lives With: Spouse Vision Baseline Vision/History: Wears glasses;Cataracts Wears Glasses: At all times Patient Visual Report: No change from baseline Vision Assessment?: No apparent visual deficits Perception  Perception: Within Functional Limits Praxis Praxis: Impaired Praxis Impairment Details: Motor planning Praxis-Other Comments: RLE Cognition Overall Cognitive Status: Within Functional Limits for tasks assessed Arousal/Alertness: Awake/alert Orientation Level: Person;Place;Situation Person: Oriented Place: Oriented Situation: Oriented Year: 2021 Month: October Day of Week: Correct Memory: Appears intact Immediate Memory Recall: Sock;Blue;Bed Memory Recall Blue: Without Cue Memory Recall Bed: Without Cue Attention: Alternating Awareness: Appears intact Problem Solving: Appears intact Safety/Judgment: Appears intact Sensation Sensation Light Touch: Impaired by gross assessment Additional Comments: mild  impairments Coordination Gross Motor Movements are Fluid and Coordinated: No Fine Motor Movements are Fluid and Coordinated: No Coordination and Movement Description: RUE impaired due to weakness 9 Hole Peg Test: unable to complete with RUE Motor  Motor Motor: Hemiplegia Motor - Skilled Clinical Observations: RUE/RLE  Trunk/Postural Assessment  Cervical Assessment Cervical Assessment: Within Functional Limits Thoracic Assessment Thoracic Assessment: Within Functional Limits Lumbar Assessment Lumbar Assessment: Within Functional Limits Postural Control Postural Control: Deficits on evaluation  Balance Balance Balance Assessed: Yes Static Sitting Balance Static Sitting - Balance Support: Bilateral upper extremity supported;Feet supported Static Sitting - Level of Assistance: 7: Independent Dynamic Sitting Balance Dynamic Sitting - Balance Support: No upper extremity supported;During functional activity;Feet supported Dynamic Sitting - Level of Assistance: 5: Stand by assistance Static Standing Balance Static Standing - Balance Support: Left upper extremity supported Static Standing - Level of Assistance: 4: Min assist Dynamic Standing Balance Dynamic Standing - Balance Support: During functional activity Dynamic Standing - Level of Assistance: 3: Mod assist Extremity/Trunk Assessment RUE Assessment RUE Assessment: Exceptions to [facility] Passive Range of Motion (PROM) Comments: WFL Active Range of Motion (AROM) Comments: Impaired overall- trace movement in digits for the first time today General Strength Comments: shoulder flexion 2+/5, biceps/triceps 2+/5, wrist 1/5 LUE Assessment LUE Assessment: Within Functional Limits  Care Tool Care Tool Self Care Eating        Oral Care    Oral Care Assist Level: Set up assist    Bathing Bathing activity did not occur:  (Declined on this date, however agreeable to bathing at next OT session)            Upper Body  Dressing(including orthotics)   What is the patient wearing?: Pull over shirt   Assist Level: Maximal Assistance - Patient 25 - 49%    Lower Body Dressing (excluding footwear)   What is the patient wearing?: Pants;Underwear/pull up Assist for lower body dressing: Maximal Assistance - Patient 25 - 49%    Putting on/Taking off footwear   What is the patient wearing?: [place] for footwear: Total Assistance - Patient < 25%       Care Tool Toileting Toileting activity Toileting Activity did not occur (Clothing management and hygiene only): N/A (no void or bm)       Care Tool Bed Mobility Roll left and right activity        Sit to lying activity  Lying to sitting edge of bed activity   Lying to sitting edge of bed assist level: Minimal Assistance - Patient > 75%     Care Tool Transfers Sit to stand transfer   Sit to stand assist level: Minimal Assistance - Patient > 75%    Chair/bed transfer   Chair/bed transfer assist level: Moderate Assistance - Patient 50 - 74%     Toilet transfer   Assist Level: Moderate Assistance - Patient 50 - 74%     Care Tool Cognition Expression of Ideas and Wants Expression of Ideas and Wants: Without difficulty (complex and basic) - expresses complex messages without difficulty and with speech that is clear and easy to understand   Understanding Verbal and Non-Verbal Content Understanding Verbal and Non-Verbal Content: Understands (complex and basic) - clear comprehension without cues or repetitions   Memory/Recall Ability *first 3 days only Memory/Recall Ability *first 3 days only: Current season;Location of own room;That he or she is in a hospital/hospital unit;Staff names and faces    Refer to Care Plan for Long Term Goals  SHORT TERM GOAL WEEK 1 OT Short Term Goal 1 (Week 1): Pt will complete LB dressing with mod assist while using hemi-dressing technique OT Short Term Goal 2 (Week 1): Pt will complete toilet transfer with min  assist OT Short Term Goal 3 (Week 1): Pt will complete bathing with mod assist.  Recommendations for other services: Therapeutic Recreation  Stress management   Skilled Therapeutic Intervention OT evaluation completed. Discussed role of OT, CIR, safety plan, possible DME, and ELOS. Pt received supine in bed, completing supine>sit with min A and bed flat. Practiced sit<>stand at EOB with OT stabilizing RLE as he completed with min A. Completed stand pivot transfer bed>w/c with mod A and max difficulty with placement of RLE. Pt declined bathing on this date, however completed oral care and shaving at sink with set up assist. OT educated on positioning of RUE during functional tasks, in bed, and sitting in w/c. Completed dressing using hemi dressing technique and with max assist. At end of session, pt left sitting in w/c with all needs in reach.   ADL   Mobility  Bed Mobility Bed Mobility: Supine to Sit Supine to Sit: Minimal Assistance - Patient > 75% Transfers Sit to Stand: Minimal Assistance - Patient > 75%   Discharge Criteria: Patient will be discharged from OT if patient refuses treatment 3 consecutive times without medical reason, if treatment goals not met, if there is a change in medical status, if patient makes no progress towards goals or if patient is discharged from hospital.  The above assessment, treatment plan, treatment alternatives and goals were discussed and mutually agreed upon: by patient  Duayne Cal 02/21/2020, 12:25 PM

## 2020-02-22 MED ORDER — BLOOD PRESSURE CONTROL BOOK
Freq: Once | Status: AC
  Filled 2020-02-22: qty 1

## 2020-02-22 NOTE — IPOC Note (Signed)
Overall Plan of Care [facility]) Patient Details Name: Hector [name] MRN: [id number] DOB: 1941-04-15  Admitting Diagnosis: Left pontine cerebrovascular accident [facility])  Hospital Problems: Principal Problem:   Left pontine cerebrovascular accident [facility])     Functional Problem List: Nursing Endurance, Medication Management, Motor, Nutrition, Pain, Skin Integrity  PT Balance, Endurance, Motor, Nutrition, Pain, Perception, Safety  OT Balance, Safety, Sensory, Endurance, Motor, Perception, Edema, Cognition  SLP Linguistic  TR         Basic ADL's: OT Grooming, Bathing, Dressing, Toileting, Eating     Advanced  ADL's: OT       Transfers: PT Bed Mobility, Bed to Chair, Car, [employer], Futures trader, Metallurgist: PT Ambulation, Emergency planning/management officer, Stairs     Additional Impairments: OT Fuctional Use of Upper Extremity  SLP Communication expression    TR      Anticipated Outcomes Item Anticipated Outcome  Self Feeding Mod I  Swallowing      Basic self-care  Supervision  Toileting  Supervision   Bathroom Transfers Supervision  Bowel/Bladder  patient will remain continent of bowel and bladder with min assist  Transfers  Supervision assist with LRAD  Locomotion  Supervision assist ambulatory with LRAD at house hold level  Communication  Mod I  Cognition     Pain  pain will be less than or equal to 4/10 with min assist  Safety/Judgment  patient will be free from falls/injury and making appropriate safety decisions.   Therapy Plan: PT Intensity: Minimum of 1-2 x/day ,45 to 90 minutes PT Frequency: 5 out of 7 days PT Duration Estimated Length of Stay: 18-21 days OT Intensity: Minimum of 1-2 x/day, 45 to 90 minutes OT Frequency: 5 out of 7 days OT Duration/Estimated Length of Stay: 16-21 days SLP Intensity: Minumum of 1-2 x/day, 30 to 90 minutes SLP Frequency: 1 to 3 out of 7 days SLP Duration/Estimated Length of Stay: 1-2 weeks for SLP   Due to  the current state of emergency, patients may not be receiving their 3-hours of Medicare-mandated therapy.   Team Interventions: Nursing Interventions Patient/Family Education, Disease Management/Prevention, Pain Management, Medication Management, Skin Care/Wound Management, Cognitive Remediation/Compensation, Discharge Planning  PT interventions Ambulation/gait training, Discharge planning, Functional mobility training, Psychosocial support, Therapeutic Activities, Visual/perceptual remediation/compensation, Wheelchair propulsion/positioning, Therapeutic Exercise, Neuromuscular re-education, Skin care/wound management, Disease management/prevention, Training and development officer, Cognitive remediation/compensation, DME/adaptive equipment instruction, Pain management, Splinting/orthotics, UE/LE Strength taining/ROM, Community reintegration, Technical sales engineer stimulation, Patient/family education, IT trainer, UE/LE Coordination activities  OT Interventions Training and development officer, Neuromuscular re-education, Disease mangement/prevention, Self Care/advanced ADL retraining, Therapeutic Exercise, Wheelchair propulsion/positioning, Cognitive remediation/compensation, DME/adaptive equipment instruction, Pain management, Skin care/wound managment, UE/LE Strength taining/ROM, Community reintegration, Technical sales engineer stimulation, Patient/family education, Splinting/orthotics, UE/LE Coordination activities, Discharge planning, Functional mobility training, Psychosocial support, Therapeutic Activities, Visual/perceptual remediation/compensation  SLP Interventions Speech/Language facilitation, Internal/external aids, Cueing hierarchy, Environmental controls, Therapeutic Activities, Patient/family education, Functional tasks  TR Interventions    SW/CM Interventions Psychosocial Support, Patient/Family Education, Discharge Planning   Barriers to Discharge MD  Medical stability and Home enviroment  access/loayout  Nursing      PT Inaccessible home environment, Decreased caregiver support, Home environment access/layout, Incontinence, Insurance for SNF coverage    OT      SLP      SW       Team Discharge Planning: Destination: PT-Home ,OT- Home , SLP-Home Projected Follow-up: PT-Home health PT, OT-  Outpatient OT, SLP-None Projected Equipment Needs: PT-Rolling [place] with 5" wheels, To be  determined, OT- To be determined, SLP-None recommended by SLP Equipment Details: PT- , OT-  Patient/family involved in discharge planning: PT- Patient,  OT-Patient, SLP-Patient  MD ELOS: 12-16d Medical Rehab Prognosis:  Good Assessment:  79 year old right-handed male with history of hypertension, atypical carcinoid tumor, BPH with TURP remote smoker.  He lives with spouse in [place] has been staying in the area with daughter after wife recent hysterectomy.  Independent prior to admission.  1 level home 2-3 steps to entry.  Presented 02/16/2020 with right side weakness and dysarthria.  Cranial CT scan showed few scattered hypoattenuating foci in the basal ganglia and left external capsule.  CT angiogram of head and neck no emergent large vessel occlusion.  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  No hemorrhage or mass-effect.  Echocardiogram with ejection fraction of 60 to 65% no wall motion abnormalities.  Admission chemistries hemoglobin 17.5, glucose 152, urinalysis negative nitrite, hemoglobin A1c 6.2, TSH 1.44.  Maintained on aspirin and Plavix x3 weeks for CVA prophylaxis followed by Plavix alone   Now requiring 24/7 Rehab RN,MD, as well as CIR level PT, OT and SLP.  Treatment team will focus on ADLs and mobility with goals set at sup See Team Conference Notes for weekly updates to the plan of care

## 2020-02-23 ENCOUNTER — Inpatient Hospital Stay (HOSPITAL_COMMUNITY): Payer: Medicare PPO | Admitting: Occupational Therapy

## 2020-02-23 ENCOUNTER — Inpatient Hospital Stay (HOSPITAL_COMMUNITY): Payer: Medicare PPO

## 2020-02-23 DIAGNOSIS — I10 Essential (primary) hypertension: Secondary | ICD-10-CM

## 2020-02-23 DIAGNOSIS — N179 Acute kidney failure, unspecified: Secondary | ICD-10-CM

## 2020-02-23 DIAGNOSIS — N400 Enlarged prostate without lower urinary tract symptoms: Secondary | ICD-10-CM

## 2020-02-23 LAB — CBC WITH DIFFERENTIAL/PLATELET
Abs Immature Granulocytes: 0.05 10*3/uL (ref 0.00–0.07)
Basophils Absolute: 0.1 10*3/uL (ref 0.0–0.1)
Basophils Relative: 1 %
Eosinophils Absolute: 0.4 10*3/uL (ref 0.0–0.5)
Eosinophils Relative: 5 %
HCT: 48.8 % (ref 39.0–52.0)
Hemoglobin: 16.7 g/dL (ref 13.0–17.0)
Immature Granulocytes: 1 %
Lymphocytes Relative: 22 %
Lymphs Abs: 1.8 10*3/uL (ref 0.7–4.0)
MCH: 31.4 pg (ref 26.0–34.0)
MCHC: 34.2 g/dL (ref 30.0–36.0)
MCV: 91.7 fL (ref 80.0–100.0)
Monocytes Absolute: 0.8 10*3/uL (ref 0.1–1.0)
Monocytes Relative: 10 %
Neutro Abs: 5.1 10*3/uL (ref 1.7–7.7)
Neutrophils Relative %: 61 %
Platelets: 198 10*3/uL (ref 150–400)
RBC: 5.32 MIL/uL (ref 4.22–5.81)
RDW: 12.6 % (ref 11.5–15.5)
WBC: 8.2 10*3/uL (ref 4.0–10.5)
nRBC: 0 % (ref 0.0–0.2)

## 2020-02-23 LAB — COMPREHENSIVE METABOLIC PANEL
ALT: 24 U/L (ref 0–44)
AST: 17 U/L (ref 15–41)
Albumin: 3.5 g/dL (ref 3.5–5.0)
Alkaline Phosphatase: 45 U/L (ref 38–126)
Anion gap: 10 (ref 5–15)
BUN: 18 mg/dL (ref 8–23)
CO2: 24 mmol/L (ref 22–32)
Calcium: 8.9 mg/dL (ref 8.9–10.3)
Chloride: 103 mmol/L (ref 98–111)
Creatinine, Ser: 1.3 mg/dL — ABNORMAL HIGH (ref 0.61–1.24)
GFR, Estimated: 52 mL/min — ABNORMAL LOW (ref >60)
Glucose, Bld: 135 mg/dL — ABNORMAL HIGH (ref 70–99)
Potassium: 4 mmol/L (ref 3.5–5.1)
Sodium: 137 mmol/L (ref 135–145)
Total Bilirubin: 1.3 mg/dL — ABNORMAL HIGH (ref 0.3–1.2)
Total Protein: 5.7 g/dL — ABNORMAL LOW (ref 6.5–8.1)

## 2020-02-23 NOTE — Progress Notes (Signed)
Orthopedic Tech Progress Note Patient Details:  Hector [name] 10-09-1940 [id number] Called in order to [facility]. Patient ID: BERYLE ZEITZ, male   DOB: 02-Oct-1940, 79 y.o.   MRN: [id number]   Chip Boer 02/23/2020, 2:42 PM

## 2020-02-23 NOTE — Progress Notes (Signed)
Occupational Therapy Session Note  Patient Details  Name: Hector [name] MRN: [id number] Date of Birth: 05/20/1940  Today's Date: 02/23/2020 OT Individual Time: 1004-1100 OT Individual Time Calculation (min): 56 min    Short Term Goals: Week 1:  OT Short Term Goal 1 (Week 1): Pt will complete LB dressing with mod assist while using hemi-dressing technique OT Short Term Goal 2 (Week 1): Pt will complete toilet transfer with min assist OT Short Term Goal 3 (Week 1): Pt will complete bathing with mod assist.  Skilled Therapeutic Interventions/Progress Updates:    Pt worked on shower and dressing during session.  He used the RW with hand splint on the right for support to transfer into the bathroom.  Mod facilitation needed with increased knee flexion noted and decreased efficiency with clearing the right foot.  He was able to remove clothing with overall mod assist before showering.  Max hand over hand assistance was needed for using the RUE to wash the left arm as well as when holding the toothpaste to remove the cap during grooming tasks.  He was able to complete bathing in sitting overall with lateral lean for washing peri area and min assist.  He transferred stand pivot to the wheelchair with mod assist to work on grooming and dressing tasks at the sink.  He complete oral hygiene with setup as well as shaving with the electric razor.  He next completed UB dressing with mod assist following hemi techniques and then LB dressing at the same level.  He was left sitting in the wheelchair with the call button and phone in reach and safety belt in place.  Education on self AAROM exercises were also given for elbow flexion/extension and digit flexion and extension, so he could work on these throughout the day.    Therapy Documentation Precautions:  Precautions Precautions: Fall Precaution Comments: R-sided weakness Restrictions Weight Bearing Restrictions: No Pain: Pain Assessment Pain Scale:  Faces Pain Score: 0-No pain ADL: See Care Tool Section for some details of mobility and selfcare  Therapy/Group: Individual Therapy  Elfriede Bonini OTR/L 02/23/2020, 12:37 PM

## 2020-02-23 NOTE — Progress Notes (Signed)
Speech Language Pathology Daily Session Note  Patient Details  Name: Hector [name] MRN: [id number] Date of Birth: Jul 20, 1940  Today's Date: 02/23/2020 SLP Individual Time: 0902-0930 SLP Individual Time Calculation (min): 28 min  Short Term Goals: Week 1: SLP Short Term Goal 1 (Week 1): Patient will utilize speech intelligibility strategies at the sentence level in a moderately noisy enviornment to achieve ~95% intelligibility with supervision verbal cues.  Skilled Therapeutic Interventions:Skilled ST services focused on education and speech skills. SLP facilitated use of slow rate to over articulate and increase vocal intensity in structured multiple syllable word task,demonstrating 95-99% intelligibility at a sentence level. In conversation pt demonstrated 85-90% intelligibility with min A verbal cues for over articulation and increase vocal intensity. Pt self rated speech intelligibility at 75% to baseline. Pt's daughter entered during the last 10 minutes of treatment. Education was provided pertaining to speech deficits and strategies for increase intelligibility. All questions answered to satisfaction. Pt was left in room with daughter, call bell within reach and chair alarm set. SLP recommends to continue skilled services.     Pain Pain Assessment Pain Score: 0-No pain  Therapy/Group: Individual Therapy  Yuriel Lopezmartinez  [facility] 02/23/2020, 4:53 PM

## 2020-02-23 NOTE — Progress Notes (Signed)
° °  Patient Details  Name: Hector [name] MRN: [id number] Date of Birth: May 28, 1940  Today's Date: 02/23/2020  Hospital Problems: Principal Problem:   Left pontine cerebrovascular accident [facility])  Past Medical History:  Past Medical History:  Diagnosis Date   Diverticulitis 04/2012   admission [facility]   Former smoker    Smoked for 20 yrs;  Quit  25 yrs ago.   GERD (gastroesophageal reflux disease)    Hypertension    Hypokalemia    Lung cancer ([place])    Personal history of colonic adenoma 04/2009   Polycythemia    Prostatitis    S/P  TURP   Past Surgical History:  Past Surgical History:  Procedure Laterality Date   APPENDECTOMY  02/25/11   COLONOSCOPY     LOBECTOMY     TRANSURETHRAL RESECTION OF PROSTATE  2004   Social History:  reports that he quit smoking about 31 years ago. He has never used smokeless tobacco. He reports current alcohol use of about 7.0 standard drinks of alcohol per week. He reports that he does not use drugs.  Family / Support Systems Patient Roles: Spouse Spouse/Significant Other: Santiago Glad Children: Lattie Haw (Dtr) Other Supports: Laurrie (daughter) Anticipated Caregiver: Lattie Haw and Santiago Glad Ability/Limitations of Caregiver: Spouse recovering from surgery. Caregiver Availability: 24/7  Social History Preferred language: English Religion: Protestant Read: Yes Write: Yes Guardian/Conservator: spouse   Abuse/Neglect Abuse/Neglect Assessment Can Be Completed: Yes Physical Abuse: Denies Verbal Abuse: Denies Sexual Abuse: Denies Exploitation of patient/patient's resources: Denies  Emotional Status Pt's affect, behavior and adjustment status: pt reports feeling emotional and disappointed at him self. Daughter reports some depression, pt prior level is independent Recent Psychosocial Issues: no Psychiatric History: no Substance Abuse History: no  Patient / Family Perceptions, Expectations & Goals Pt/Family understanding of illness  & functional limitations: yes Premorbid pt/family roles/activities: Spouse/Father/Independent Anticipated changes in roles/activities/participation: Daughter able to assit Pt/family expectations/goals: Min A  [employer]: None Premorbid Home Care/DME Agencies: None Transportation available at discharge: Family able to transport Resource referrals recommended: Neuropsychology  Discharge Planning Living Arrangements: Spouse/significant other, Children Support Systems: Children, Spouse/significant other Type of Residence: Private residence (3 steps to enter, no railings) Administrator, sports: Multimedia programmer (specify) ([place]) Museum/gallery curator Resources: Fish farm manager, [place] Referred: No Money Management: Patient Does the patient have any problems obtaining your medications?: No Care Coordinator Anticipated Follow Up Needs: [place] Additional Notes/Comments: Spouse recovering from surgery (currently staying with dtr). Patient will be discharging to daughters home [facility]) as well Expected length of stay: 19-23 Days  Clinical Impression Sw entered room, introduced self explained role and process, Called daughter at bedside provided same information. Will continue to follow up with questions and concerns  Dyanne Iha 02/23/2020, 1:12 PM

## 2020-02-23 NOTE — Progress Notes (Signed)
Physical Therapy Session Note  Patient Details  Name: Hector [name] MRN: [id number] Date of Birth: 31-May-1940  Today's Date: 02/23/2020 PT Individual Time: 0800-0855 PT Individual Time Calculation (min): 55 min   Short Term Goals: Week 1:  PT Short Term Goal 1 (Week 1): Pt will transfer to and from [facility] with min assist PT Short Term Goal 2 (Week 1): Pt will ambulate 10ft with mod assist and LRAD PT Short Term Goal 3 (Week 1): Pt will Propell WC with supervision assist x 150ft PT Short Term Goal 4 (Week 1): Pt will consistenly perform bed mobility with CGA  Skilled Therapeutic Interventions/Progress Updates:    Patient received supine in bed, agreeable to PT. He denies pain at this time. CGA provided for supine > sitting edge of bed with verbal cues to attend to R hand placement. He was able to transfer to wc via stand pivot with MinA x1 when leading L. Patient completing morning ADLs at the sink with MinA and verbal cues to engage R UE as able. PT propelling patient to therapy gym in wc for time management and energy conservation. Minisquats in // bars 3x12 with emphasis on R knee control minimizing buckling + genu recurvatum. Unbalanced muscle activation with inability to maintain static R knee posture increasing with fatigue. Patient able to complete toe taps to 6" box in // bars with MinA + verbal cues to engage R quads for improved R knee stability. Patient requiring MaxA to achieve quadruped positioning on therapy mat in order to achieve proprioceptive input through R UE. Limited core stability and endurance noted as patient was only able to maintain this position for ~30s for collapsing out of it. Squat pivot with ModA leading to the L to wc. Patient returning to room in wc, seatbelt alarm on, call light within reach.   Therapy Documentation Precautions:  Precautions Precautions: Fall Precaution Comments: R-sided weakness Restrictions Weight Bearing Restrictions: No    Therapy/Group:  Individual Therapy  Karoline Caldwell, PT, DPT, CBIS 02/23/2020, 7:39 AM

## 2020-02-23 NOTE — Progress Notes (Signed)
Inpatient Rehabilitation  Patient information reviewed and entered into eRehab system by Bralyn Espino M. Annika Selke, M.A., CCC/SLP, PPS Coordinator.  Information including medical coding, functional ability and quality indicators will be reviewed and updated through discharge.    

## 2020-02-23 NOTE — Progress Notes (Addendum)
[place] PHYSICAL MEDICINE & REHABILITATION PROGRESS NOTE   Subjective/Complaints: Patient seen laying in bed this morning.  He states he slept fairly overnight, due to discomfort from hospital bed.  He states that a good weekend.  Review of systems: Denies CP, shortness of breath, nausea, vomiting, diarrhea.   Objective:   No results found. Recent Labs    02/23/20 0530  WBC 8.2  HGB 16.7  HCT 48.8  PLT 198   Recent Labs    02/23/20 0530  NA 137  K 4.0  CL 103  CO2 24  GLUCOSE 135*  BUN 18  CREATININE 1.30*  CALCIUM 8.9    Intake/Output Summary (Last 24 hours) at 02/23/2020 1407 Last data filed at 02/23/2020 0518 Gross per 24 hour  Intake 177 ml  Output 650 ml  Net -473 ml        Physical Exam: Vital Signs Blood pressure 122/74, pulse 66, temperature 98.4 F (36.9 C), resp. rate 19, SpO2 98 %. Constitutional: No distress . Vital signs reviewed. HENT: Normocephalic.  Atraumatic. Eyes: EOMI. No discharge. Cardiovascular: No JVD.  RRR. Respiratory: Normal effort.  No stridor.  Bilateral clear to auscultation. GI: Non-distended.  BS +. Skin: Warm and dry.  Intact. Psych: Normal mood.  Normal behavior. Musc: No edema in extremities.  No tenderness in extremities. Neuro: Alert Motor: LUE/LE: 5/5 proximal distal RUE: 2+/5 proximal distal RLE: Hip flexion, knee extension 3/5, ankle dorsiflexion 2/5  Assessment/Plan: 1. 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  Physiatrist is providing close team supervision and 24 hour management of active medical problems listed below.  Physiatrist and rehab team continue to assess barriers to discharge/monitor patient progress toward functional and medical goals  Care Tool:  Bathing  Bathing activity did not occur: Refused (Declined on this date, however agreeable to bathing at next OT  session) Body parts bathed by patient: Right arm, Chest, Abdomen, Front perineal area, Right upper leg, Left upper leg, Right lower leg, Left lower leg, Face   Body parts bathed by helper: Left arm, Buttocks     Bathing assist Assist Level: Minimal Assistance - Patient > 75%     Upper Body Dressing/Undressing Upper body dressing   What is the patient wearing?: Pull over shirt    Upper body assist Assist Level: Moderate Assistance - Patient 50 - 74%    Lower Body Dressing/Undressing Lower body dressing      What is the patient wearing?: Pants, Underwear/pull up     Lower body assist Assist for lower body dressing: Moderate Assistance - Patient 50 - 74%     Toileting Toileting Toileting Activity did not occur Landscape architect and hygiene only): N/A (no void or bm)  Toileting assist Assist for toileting: Independent with assistive device Assistive Device Comment:  (Urinal)   Transfers Chair/bed transfer  Transfers assist     Chair/bed transfer assist level: Minimal Assistance - Patient > 75%     Locomotion Ambulation   Ambulation assist      Assist level: Moderate Assistance - Patient 50 - 74% Assistive device: Cohen-platform Max distance: 10'   Walk 10 feet activity   Assist     Assist level: Maximal Assistance - Patient 25 - 49%     Walk 50 feet activity   Assist Walk 50 feet with 2 turns activity did not occur: Safety/medical concerns  Walk 150 feet activity   Assist Walk 150 feet activity did not occur: Safety/medical concerns         Walk 10 feet on uneven surface  activity   Assist Walk 10 feet on uneven surfaces activity did not occur: Safety/medical concerns         Wheelchair     Assist Will patient use wheelchair at discharge?: Yes (Per PT long term goals) Type of Wheelchair: Manual    Wheelchair assist level: Minimal Assistance - Patient > 75% Max wheelchair distance: 150    Wheelchair 50 feet  with 2 turns activity    Assist        Assist Level: Minimal Assistance - Patient > 75%   Wheelchair 150 feet activity     Assist      Assist Level: Minimal Assistance - Patient > 75%   Blood pressure 122/74, pulse 66, temperature 98.4 F (36.9 C), resp. rate 19, SpO2 98 %.  Medical Problem List and Plan: 1.  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  Antithrombotics: -DVT/anticoagulation:               -antiplatelet therapy: Aspirin 81 mg daily and Plavix 75 mg daily x3 weeks followed by Plavix alone 3. Pain Management: Tylenol as needed. Well controlled 4. Mood: Provide emotional support             -antipsychotic agents: N/A 5. Neuropsych: This patient is capable of making decisions on his own behalf. 6. Skin/Wound Care: Routine skin checks. 7. Fluids/Electrolytes/Nutrition: Routine in and outs. 8.  Hypertension. Tenormin 50 mg daily, Cozaar 25 mg daily Vitals:   02/23/20 0827 02/23/20 1403  BP: 136/78 122/74  Pulse: 62 66  Resp:  19  Temp:  98.4 F (36.9 C)  SpO2:  98%   Increased Cozaar to 50mg  on 10/17  Controlled on 10/18 9.  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  BPH with TURP.    PVRs ordered 11.  Hyperlipidemia.  Lipitor 12.  AKI  Creatinine 1.30 on 10/18  Encourage fluids  Continue to monitor   LOS: 3 days A FACE TO FACE EVALUATION WAS PERFORMED  Dmetrius Ambs [name] 02/23/2020, 2:07 PM

## 2020-02-23 NOTE — Progress Notes (Signed)
[place] Individual Statement of Services  Patient Name:  Hector [name]  Date:  02/23/2020  Welcome to the [facility].  Our goal is to provide you with an individualized program based on your diagnosis and situation, designed to meet your specific needs.  With this comprehensive rehabilitation program, you will be expected to participate in at least 3 hours of rehabilitation therapies Monday-Friday, with modified therapy programming on the weekends.  Your rehabilitation program will include the following services:  Physical Therapy (PT), Occupational Therapy (OT), Speech Therapy (ST), 24 hour per day rehabilitation nursing, Therapeutic Recreaction (TR), Neuropsychology, Care Coordinator, Rehabilitation Medicine, Nutrition Services, Pharmacy Services and Other  Weekly team conferences will be held on Wednesday to discuss your progress.  Your Inpatient Rehabilitation Care Coordinator will talk with you frequently to get your input and to update you on team discussions.  Team conferences with you and your family in attendance may also be held.  Expected length of stay: 19-23 Days  Overall anticipated outcome: Min A  Depending on your progress and recovery, your program may change. Your Inpatient Rehabilitation Care Coordinator will coordinate services and will keep you informed of any changes. Your Inpatient Rehabilitation Care Coordinator's name and contact numbers are listed  below.  The following services may also be recommended but are not provided by the [facility]:    [place] will be made to provide these services after discharge if needed.  Arrangements include referral to agencies that provide these services.  Your insurance has been verified to be:  [employer] Your primary doctor is:  NO PCP  Pertinent information will be shared with  your doctor and your insurance company.  Inpatient Rehabilitation Care Coordinator:  Erlene Quan, [place] or [phone]  Information discussed with and copy given to patient by: Dyanne Iha, 02/23/2020, 11:55 AM

## 2020-02-23 NOTE — Progress Notes (Signed)
Physical Therapy Session Note  Patient Details  Name: Hector [name] MRN: [id number] Date of Birth: 1940-06-02  Today's Date: 02/23/2020 PT Individual Time: 1415-1531 PT Individual Time Calculation (min): 76 min   Short Term Goals: Week 1:  PT Short Term Goal 1 (Week 1): Pt will transfer to and from [facility] with min assist PT Short Term Goal 2 (Week 1): Pt will ambulate 58ft with mod assist and LRAD PT Short Term Goal 3 (Week 1): Pt will Propell WC with supervision assist x 111ft PT Short Term Goal 4 (Week 1): Pt will consistenly perform bed mobility with CGA  Skilled Therapeutic Interventions/Progress Updates:    Patient received sitting up in wc agreeable to PT. He denies pain, but endorses fatigue. PT propelling patient in wc for time management and energy conservation. MinA STS to don LiteGait harness. He was able to ambulate the following distances/times over treadmill with LiteGait: 3'30" for 21ft at 0.26mph, 3'30" for 131ft at 0.21mph, 2'20" for 60ft at 0.79mph and 2'20" for 47ft at 0.27mph. Patient requiring extended rest break between bouts of ambulation. Intermittent MinA needed to advance R LE and assist with R knee stability thru stance. Patient interchanging between R genu recurvatum in stance and R knee buckling. Buckling more apparent in early stance phase, genu recurvatum more apparent in terminal stance resulting in posterior pelvic rotation. Patient returning to room requesting to use restroom. [place] stand pivot to toilet using grab bars. Increase in assistance needed likely related R LE fatigue. Patient was able to have a bowel movement and complete peri-hygiene independently. ModA transfer back to wc and CGA squat pivot to bed leading L. Bed alarm on, call light within reach.   Therapy Documentation Precautions:  Precautions Precautions: Fall Precaution Comments: R-sided weakness Restrictions Weight Bearing Restrictions: No    Therapy/Group: Individual Therapy  Karoline Caldwell, PT, DPT, CBIS 02/23/2020, 7:38 AM

## 2020-02-24 ENCOUNTER — Inpatient Hospital Stay (HOSPITAL_COMMUNITY): Payer: Medicare PPO | Admitting: Physical Therapy

## 2020-02-24 ENCOUNTER — Inpatient Hospital Stay (HOSPITAL_COMMUNITY): Payer: Medicare PPO

## 2020-02-24 ENCOUNTER — Inpatient Hospital Stay (HOSPITAL_COMMUNITY): Payer: Medicare PPO | Admitting: Occupational Therapy

## 2020-02-24 ENCOUNTER — Inpatient Hospital Stay (HOSPITAL_COMMUNITY): Payer: Medicare PPO | Admitting: Speech Pathology

## 2020-02-24 NOTE — Progress Notes (Signed)
[place] PHYSICAL MEDICINE & REHABILITATION PROGRESS NOTE   Subjective/Complaints:  Pt with multiple questions about stroke etiology as well as role or advisability to cont sildenafil post CVA  RN notes pt c/o Right ear pain last noc, pt denies current pain , has had this in the past when sleeping on it   Review of systems: Denies CP, shortness of breath, nausea, vomiting, diarrhea.   Objective:   No results found. Recent Labs    02/23/20 0530  WBC 8.2  HGB 16.7  HCT 48.8  PLT 198   Recent Labs    02/23/20 0530  NA 137  K 4.0  CL 103  CO2 24  GLUCOSE 135*  BUN 18  CREATININE 1.30*  CALCIUM 8.9    Intake/Output Summary (Last 24 hours) at 02/24/2020 0908 Last data filed at 02/24/2020 0848 Gross per 24 hour  Intake 877 ml  Output 1800 ml  Net -923 ml        Physical Exam: Vital Signs Blood pressure (!) 141/76, pulse 65, temperature 98.6 F (37 C), resp. rate 19, SpO2 95 %. HEENT- tragus, helix, antihelix normal no tenderness to palp no swelling or erythema  General: No acute distress Mood and affect are appropriate Heart: Regular rate and rhythm no rubs murmurs or extra sounds Lungs: Clear to auscultation, breathing unlabored, no rales or wheezes Abdomen: Positive bowel sounds, soft nontender to palpation, nondistended Extremities: No clubbing, cyanosis, or edema Skin: No evidence of breakdown, no evidence of rash Musculoskeletal: Full range of motion in all 4 extremities. No joint swelling  Neuro: Alert Motor: LUE/LE: 5/5 proximal distal RUE: 2+/5 proximal distal RLE: Hip flexion, knee extension 3/5, ankle dorsiflexion 2/5  Assessment/Plan: 1. 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  Physiatrist is providing close team supervision and 24 hour management of active medical problems listed below.  Physiatrist and rehab  team continue to assess barriers to discharge/monitor patient progress toward functional and medical goals  Care Tool:  Bathing  Bathing activity did not occur: Refused (Declined on this date, however agreeable to bathing at next OT session) Body parts bathed by patient: Face, Buttocks, Front perineal area   Body parts bathed by helper: Left arm, Buttocks Body parts n/a: Left lower leg, Right lower leg, Left upper leg, Right upper leg, Abdomen, Chest, Left arm, Right arm   Bathing assist Assist Level: Minimal Assistance - Patient > 75% (did not attempt this session)     Upper Body Dressing/Undressing Upper body dressing   What is the patient wearing?: Pull over shirt    Upper body assist Assist Level: Moderate Assistance - Patient 50 - 74%    Lower Body Dressing/Undressing Lower body dressing      What is the patient wearing?: Pants, Underwear/pull up     Lower body assist Assist for lower body dressing: Minimal Assistance - Patient > 75%     Toileting Toileting Toileting Activity did not occur (Clothing management and hygiene only): N/A (no void or bm)  Toileting assist Assist for toileting: Minimal Assistance - Patient > 75% Assistive Device Comment:  (urinal/stedy)   Transfers Chair/bed transfer  Transfers assist     Chair/bed transfer assist level: Minimal Assistance - Patient > 75%     Locomotion Ambulation   Ambulation assist      Assist level: Moderate Assistance - Patient 50 - 74% Assistive device: Hand held assist Max distance:  10'   Walk 10 feet activity   Assist     Assist level: Minimal Assistance - Patient > 75% Assistive device: Lite Gait   Walk 50 feet activity   Assist Walk 50 feet with 2 turns activity did not occur: Safety/medical concerns  Assist level: Minimal Assistance - Patient > 75% Assistive device: Lite Gait    Walk 150 feet activity   Assist Walk 150 feet activity did not occur: Safety/medical concerns          Walk 10 feet on uneven surface  activity   Assist Walk 10 feet on uneven surfaces activity did not occur: Safety/medical concerns         Wheelchair     Assist Will patient use wheelchair at discharge?: Yes (Per PT long term goals) Type of Wheelchair: Manual    Wheelchair assist level: Minimal Assistance - Patient > 75% Max wheelchair distance: 150    Wheelchair 50 feet with 2 turns activity    Assist        Assist Level: Minimal Assistance - Patient > 75%   Wheelchair 150 feet activity     Assist      Assist Level: Minimal Assistance - Patient > 75%   Blood pressure (!) 141/76, pulse 65, temperature 98.6 F (37 C), resp. rate 19, SpO2 95 %.  Medical Problem List and Plan: 1.  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  Antithrombotics: -DVT/anticoagulation:               -antiplatelet therapy: Aspirin 81 mg daily and Plavix 75 mg daily x3 weeks followed by Plavix alone 3. Pain Management: Tylenol as needed. Well controlled 4. Mood: Provide emotional support             -antipsychotic agents: N/A 5. Neuropsych: This patient is capable of making decisions on his own behalf. 6. Skin/Wound Care: Routine skin checks. 7. Fluids/Electrolytes/Nutrition: Routine in and outs. 8.  Hypertension. Tenormin 50 mg daily, Cozaar 25 mg daily Vitals:   02/24/20 0549 02/24/20 0725  BP: (!) 171/83 (!) 141/76  Pulse: 63 65  Resp: 19   Temp: 98.6 F (37 C)   SpO2: 98% 95%   Increased Cozaar to 50mg  on 10/17  Controlled on 10/18, some lability 10/19 monitor on current dose, would not increase atenolol due to bradycardia 9.  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  BPH with TURP.    PVRs ordered 11.  Hyperlipidemia.  Lipitor 12.  AKI  Creatinine 1.30 on 10/18  Encourage  fluids  Continue to monitor   LOS: 4 days A FACE TO FACE EVALUATION WAS PERFORMED  Charlett Blake 02/24/2020, 9:08 AM

## 2020-02-24 NOTE — Progress Notes (Signed)
Occupational Therapy Session Note  Patient Details  Name: Hector [name] MRN: [id number] Date of Birth: 03-24-1941  Today's Date: 02/24/2020 OT Individual Time: 6283-1517 OT Individual Time Calculation (min): 48 min    Short Term Goals: Week 1:  OT Short Term Goal 1 (Week 1): Pt will complete LB dressing with mod assist while using hemi-dressing technique OT Short Term Goal 2 (Week 1): Pt will complete toilet transfer with min assist OT Short Term Goal 3 (Week 1): Pt will complete bathing with mod assist.  Skilled Therapeutic Interventions/Progress Updates:    Pt in bed to start session.  He was able to transfer to the EOB with min assist and then transferred over to the wheelchair with min assist stand pivot.  Increased right knee flexion/buckling noted in standing.  He was able to complete grooming tasks of washing his face and brushing his teeth with setup.  Use of the RUE was integrated for stabilizing the toothpaste to be opened.  He was able to complete washing of his peri area and buttocks in standing with min assist.  Mod instructional cueing with min facilitation for maintaining right knee extension.  He was able to complete donning of underpants and pants with min assist after washing.  Min assist for crossing and maintaining the RLE over the left knee while completing these tasks.  He was able to complete toilet transfer with mod assist, ambulating from the wheelchair at the sink to the toilet.  Min assist for completion of clothing management sit to stand.  Finished session with therapist assist to donn his TEDs and then he was able to donn his slip on shoes with overall min assist.  He stayed sitting up in the wheelchair with the call button and phone in reach and safety belt in place.     Therapy Documentation Precautions:  Precautions Precautions: Fall Precaution Comments: R-sided weakness Restrictions Weight Bearing Restrictions: No  Pain: Pain Assessment Pain Scale:  Faces Pain Score: 0-No pain ADL: See Care Tool Section for some details for selfcare and mobility  Therapy/Group: Individual Therapy  Seyed Heffley OTR/L 02/24/2020, 9:03 AM

## 2020-02-24 NOTE — Progress Notes (Signed)
Speech Language Pathology Daily Session Note  Patient Details  Name: Hector [name] MRN: [id number] Date of Birth: 1940/08/02  Today's Date: 02/24/2020 SLP Individual Time: 9047-5339 SLP Individual Time Calculation (min): 40 min  Short Term Goals: Week 1: SLP Short Term Goal 1 (Week 1): Patient will utilize speech intelligibility strategies at the sentence level in a moderately noisy enviornment to achieve ~95% intelligibility with supervision verbal cues.  Skilled Therapeutic Interventions: Pt was seen for skilled ST targeting speech goals. Pt able to recall strategies for increased speech intelligibility Mod I; also demonstrated use of increased vocal intensity and overarticulation with ability to self correct verbal errors Mod I during sentence level barrier speech task, as well as in functional conversation. Pt was 100% intelligible throughout session. He reported that he feels his speech is nearly back to baseline and getting better everyday. ST will continue to follow up briefly to reinforce strategies and finish education with pt for mild dysarthria. Pt left sitting in chair with alarm set and needs within reach. Continue per current plan of care.          Pain Pain Assessment Pain Scale: 0-10 Pain Score: 0-No pain  Therapy/Group: Individual Therapy  Arbutus Leas 02/24/2020, 12:23 PM

## 2020-02-24 NOTE — Progress Notes (Signed)
Physical Therapy Session Note  Patient Details  Name: Hector [name] MRN: [id number] Date of Birth: 02/22/1941  Today's Date: 02/24/2020 PT Individual Time: 5102-5852 PT Individual Time Calculation (min): 41 min   Short Term Goals: Week 1:  PT Short Term Goal 1 (Week 1): Pt will transfer to and from [facility] with min assist PT Short Term Goal 2 (Week 1): Pt will ambulate 51ft with mod assist and LRAD PT Short Term Goal 3 (Week 1): Pt will Propell WC with supervision assist x 119ft PT Short Term Goal 4 (Week 1): Pt will consistenly perform bed mobility with CGA  Skilled Therapeutic Interventions/Progress Updates:    Pt received sitting in w/c and agreeable to therapy session. Transported to/from gym in w/c for time management and energy conservation. R stand pivot w/c>EOM with min assist for balance - sustains R knee flexed but no significant buckle. Pt requests to focus part of session on R UE. R LE NMR via repeated sit<>stands to/from EOM x15reps, no UE support, with min assist for lifting/balance - mirror feedback for midline orientation to increase R LE weight bearing. Seated R UE NMR via reaching to grasp horseshoes then place beside him on mat - requires therapist to stabilize horseshoe to achieve grasp but then able use flexion of fingers to carry it back to the mat. Progressed to performing this task in standing without UE support focusing on dynamic standing balance with min/mod assist - facilitating increased R hip/knee extension in stance and working on R trunk/pelvic rotation to reach posteriorly and place horseshoe on mat. R LE NMR targeting stance control during pre-gait via L LE stepping forward/backwards without UE support - min/mod assist for balance and therapist facilitating increased R hip/knee extension and anterior weight shift - pt demos some decreased activation of hip musculature. R LE NMR targeting swing phase focusing on hip/knee flexion via foot taps on/off 6" step, no UE  support, with min assist for balance - pt able to place foot on/off step with good hip/knee flexion activation but requires cuing for increase hip abduction when stepping foot back off. Sitting in w/c<>tall kneeling on mat with mod assist for balance - pt able to lift R LE and place on mat without assist. In tall kneeling, no UE support, and mirror feedback performed kneeling squats x10 reps with focus on R glute activation and return to midline progressed to increased R LE hip abductor activation via L knee steps in/out all with min assist for balance. Transported back to room and pt agreeable to remain seated in w/c - left with needs in reach, seat belt alarm on, and R UE supported on lap tray.   Therapy Documentation Precautions:  Precautions Precautions: Fall Precaution Comments: R-sided weakness Restrictions Weight Bearing Restrictions: No  Pain: Denies pain during session.   Therapy/Group: Individual Therapy  Tawana Scale , PT, DPT, CSRS  02/24/2020, 8:04 AM

## 2020-02-24 NOTE — Progress Notes (Signed)
Physical Therapy Session Note  Patient Details  Name: Hector [name] MRN: [id number] Date of Birth: 11-10-1940  Today's Date: 02/24/2020 PT Individual Time: 4193-7902 PT Individual Time Calculation (min): 56 min   Short Term Goals: Week 1:  PT Short Term Goal 1 (Week 1): Pt will transfer to and from [facility] with min assist PT Short Term Goal 2 (Week 1): Pt will ambulate 87ft with mod assist and LRAD PT Short Term Goal 3 (Week 1): Pt will Propell WC with supervision assist x 172ft PT Short Term Goal 4 (Week 1): Pt will consistenly perform bed mobility with CGA  Skilled Therapeutic Interventions/Progress Updates:    Patient received sitting up in wc agreeable to PT. He denies pain at this time. PT propelling patient in wc to therapy gym for time management. He was able to ambulate the following distances on treadmill with LiteGait: 3'35" for 128ft at 0.55mph, 3'15" for 141 ft at 0.55mph, 3'55" for 173 ft at 0.46mph and 3' for 137ft at 0.36mph. He continues to require up to [facility] for R knee stability in stance. Verbal cuing for increased dorsiflexion through swing phase. As patient fatigues, increased R genu recurvatum through stance for improved stability. Patient returning to bed via stand pivot with CGA. Bed alarm on, call light within reach.   Therapy Documentation Precautions:  Precautions Precautions: Fall Precaution Comments: R-sided weakness Restrictions Weight Bearing Restrictions: No    Therapy/Group: Individual Therapy  Karoline Caldwell, PT, DPT, CBIS 02/24/2020, 7:42 AM

## 2020-02-25 ENCOUNTER — Inpatient Hospital Stay (HOSPITAL_COMMUNITY): Payer: Medicare PPO

## 2020-02-25 ENCOUNTER — Inpatient Hospital Stay (HOSPITAL_COMMUNITY): Payer: Medicare PPO | Admitting: Occupational Therapy

## 2020-02-25 ENCOUNTER — Inpatient Hospital Stay (HOSPITAL_COMMUNITY): Payer: Medicare PPO | Admitting: *Deleted

## 2020-02-25 ENCOUNTER — Inpatient Hospital Stay (HOSPITAL_COMMUNITY): Payer: Medicare PPO | Admitting: Physical Therapy

## 2020-02-25 LAB — JAK2 EXONS 12-15

## 2020-02-25 LAB — JAK2  V617F QUAL. WITH REFLEX TO EXON 12: Reflex:: 15

## 2020-02-25 MED ORDER — ALUM & MAG HYDROXIDE-SIMETH 200-200-20 MG/5ML PO SUSP
15.0000 mL | Freq: Four times a day (QID) | ORAL | Status: DC | PRN
  Administered 2020-02-25 – 2020-03-03 (×3): 15 mL via ORAL
  Filled 2020-02-25 (×3): qty 30

## 2020-02-25 NOTE — Evaluation (Signed)
Recreational Therapy Assessment and Plan  Patient Details  Name: Hector [name] MRN: [id number] Date of Birth: December 20, 1940 Today's Date: 02/25/2020  Rehab Potential:   Good ELOS:  2.5- 3 weeks     Hospital Problem: Principal Problem:   Left pontine cerebrovascular accident [facility])   Past Medical History:      Past Medical History:  Diagnosis Date  . Diverticulitis 04/2012   admission [facility]  . Former smoker    Smoked for 20 yrs;  Quit  25 yrs ago.  Marland Kitchen GERD (gastroesophageal reflux disease)   . Hypertension   . Hypokalemia   . Lung cancer ([place])   . 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 Polycythemia   . Prostatitis    S/P  TURP   Past Surgical History:       Past Surgical History:  Procedure Laterality Date  . APPENDECTOMY  02/25/11  . COLONOSCOPY    . LOBECTOMY    . TRANSURETHRAL RESECTION OF PROSTATE  2004    Assessment & Plan Clinical Impression: Patient is a 79 year old right-handed male with history of hypertension, atypical carcinoid tumor, BPH with TURP remote smoker. He lives with spouse in [place] has been staying in the area with daughter after wife recent hysterectomy. Independent prior to admission. 1 level home 2-3 steps to entry. Presented 02/16/2020 with right side weakness and dysarthria. Cranial CT scan showed few scattered hypoattenuating foci in the basal ganglia and left external capsule. CT angiogram of head and neck no emergent large vessel occlusion. 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 No hemorrhage or mass-effect. Echocardiogram with ejection fraction of 60 to 65% no wall motion abnormalities. Admission chemistries hemoglobin 17.5, glucose 152, urinalysis negative nitrite, hemoglobin A1c 6.2, TSH 1.44.  Patient transferred to CIR on 02/20/2020 .   Pt presents with decreased activity tolerance, decreased functional mobility, decreased balance, decreased coordination Limiting pt's  independence with leisure/community pursuits.  Met with pt today to discuss TR services, leisure interests and their impact on cognitive, social, emotional, spiritual health in addition to physical health.  Pt stated understanding.  Pt is motivated to regain independence.   Plan  Min 1 TR session per week >20 minutes  Recommendations for other services: None   Discharge Criteria: Patient will be discharged from TR if patient refuses treatment 3 consecutive times without medical reason.  If treatment goals not met, if there is a change in medical status, if patient makes no progress towards goals or if patient is discharged from hospital.  The above assessment, treatment plan, treatment alternatives and goals were discussed and mutually agreed upon: by patient  [place] 02/25/2020, 9:12 AM

## 2020-02-25 NOTE — Progress Notes (Signed)
Occupational Therapy Session Note  Patient Details  Name: Hector [name] MRN: [id number] Date of Birth: 1941-02-22  Today's Date: 02/25/2020 OT Individual Time: 0750-0829 OT Individual Time Calculation (min): 39 min    Short Term Goals: Week 1:  OT Short Term Goal 1 (Week 1): Pt will complete LB dressing with mod assist while using hemi-dressing technique OT Short Term Goal 2 (Week 1): Pt will complete toilet transfer with min assist OT Short Term Goal 3 (Week 1): Pt will complete bathing with mod assist.  Skilled Therapeutic Interventions/Progress Updates:    1:1. Pt received in bed agreeable to OT. Pt reporting need to toilet, dress, and groom at sink. Pt completes min A overall stand pivot transfers with NO AD and demo poor R knee stability stepping to R. Pt able to manage pants with A for balance only. Pt doffs shirt and grooms at sink with setup seated in w/c. OT edu re estim pursposes, NMR and contraindications. Pt verbalized wanting to try estim and OT retrieves saebo stim one. However when placing later during session not charged. Pt dons pants with MOD A and VC for crossing LE into figure 4. Pt requires A to don teds and shoes d/t time management and VC only to don shirt pushing fabric over R shoulder. Exited sesionw iht pt seated in w.c, exit alarm on and call light in reach    Therapy Documentation Precautions:  Precautions Precautions: Fall Precaution Comments: R-sided weakness Restrictions Weight Bearing Restrictions: No General:   Vital Signs: Therapy Vitals Temp: 98.2 F (36.8 C) Temp Source: Oral Pulse Rate: (!) 58 Resp: 19 BP: 123/64 Patient Position (if appropriate): Lying Oxygen Therapy SpO2: 97 % O2 Device: Room Air Pain:   ADL:   Vision   Perception    Praxis   Exercises:   Other Treatments:     Therapy/Group: Individual Therapy  Tonny Branch 02/25/2020, 6:53 AM

## 2020-02-25 NOTE — Patient Care Conference (Signed)
Inpatient RehabilitationTeam Conference and Plan of Care Update Date: 02/25/2020   Time: 10:30 AM    Patient Name: Hector [name]      Medical Record Number: [id number]  Date of Birth: 09-Jan-1941 Sex: Male         Room/Bed: 4W05C/4W05C-01 Payor Info: Payor: [employer] MEDICARE / Plan: [employer] MEDICARE CHOICE PPO / Product Type: *No Product type* /    Admit Date/Time:  02/20/2020  3:05 PM  Primary Diagnosis:  Left pontine cerebrovascular accident [facility])  Hospital Problems: Principal Problem:   Left pontine cerebrovascular accident [facility]) Active Problems:   AKI (acute kidney injury) [facility])    Expected Discharge Date: Expected Discharge Date: 03/11/20  Team Members Present: Physician leading conference: Dr. Alysia Penna Care Coodinator Present: Dorien Chihuahua, RN, BSN, CRRN;Christina Sampson Goon, [place] Nurse Present: Mohammed Kindle, RN PT Present: Estevan Ryder, PT OT Present: Clyda Greener, OT SLP Present: Jettie Booze, CF-SLP PPS Coordinator present : Gunnar Fusi, Novella Olive, PT     Current Status/Progress Goal Weekly Team Focus  Bowel/Bladder   Pt is continent of bowel and bladder. LBM-02/24/2020  To remain continent of bowel and bladder.  Assess tolieting as needed.   Swallow/Nutrition/ Hydration             ADL's   Min assist for UB and LB bathing, wiht min assist for UB dressing and mod assist for LB dressing.  Brunnstrum stage IV -V in the right hand and arm.  Needs mod assist to integrate into bathing tasks with min assist for use as a stabilizer to hold items to be opened.  Min to mod assist for transfers to the toilet and the walk-in shower.  supervision overall  selfcare retraining, balance retraining, neuromuscular re-education, therapeutic exercise, DME education, pt education   Mobility   SPV/CGA bed mobility, CGA/MinA stand pivot, ModA gait up to 70ft using RW  SPV grossly  gait, transfers, R LE NMR, dynamic balance   Communication   95-100% intelligible in  conversation, Mod I  Mod I  at goal level, plan to d/c next visit after finishing education   Safety/Cognition/ Behavioral Observations            Pain   No complaints of pain.  To remain pain free.  Assess pain q shift or prn.   Skin   Has rash like area to upper back.  To promote healing and preventing skin breakdown from occurring.  Assess skin q shift or prn.     Discharge Planning:  Discharging home with DTR and spouse (daughters home), 3 steps to enter   Team Discussion: Issues with right knee buckling. Continue to note some dysarthria and speech intelligibility issues. Patient on target to meet rehab goals: yes  *See Care Plan and progress notes for long and short-term goals.   Revisions to Treatment Plan:  Practice with lite gait, neuromuscular re-education   Teaching Needs: Transfers, toileting, medications, etc.  Current Barriers to Discharge:   Possible Resolutions to Barriers: Family education     Medical Summary Current Status: Poor sleep, right hemiparesis is improving, continent of bowel and bladder, blood pressure still labile  Barriers to Discharge: Medical stability   Possible Resolutions to Barriers/Weekly Focus: May need to make further medication adjustments for blood pressure control   Continued Need for Acute Rehabilitation Level of Care: The patient requires daily medical management by a physician with specialized training in physical medicine and rehabilitation for the following reasons: Direction of a multidisciplinary physical rehabilitation program to  maximize functional independence : Yes Medical management of patient stability for increased activity during participation in an intensive rehabilitation regime.: Yes Analysis of laboratory values and/or radiology reports with any subsequent need for medication adjustment and/or medical intervention. : Yes   I attest that I was present, lead the team conference, and concur with the assessment and  plan of the team.   Dorien Chihuahua B 02/25/2020, 2:40 PM

## 2020-02-25 NOTE — Progress Notes (Signed)
Occupational Therapy Session Note  Patient Details  Name: Hector [name] MRN: [id number] Date of Birth: 07-04-1940  Today's Date: 02/25/2020 OT Individual Time: 1133-1207 OT Individual Time Calculation (min): 34 min    Short Term Goals: Week 1:  OT Short Term Goal 1 (Week 1): Pt will complete LB dressing with mod assist while using hemi-dressing technique OT Short Term Goal 2 (Week 1): Pt will complete toilet transfer with min assist OT Short Term Goal 3 (Week 1): Pt will complete bathing with mod assist.  Skilled Therapeutic Interventions/Progress Updates:    Pt sitting up in the wheelchair to start session.  He was taken down to the ortho gym for session to work on Clinical research associate and strengthening.  Therapist had him work with with use of the UE ergonometer.  Ace bandage was used to help maintain grip with the RUE as he could not maintain it initially.  Resistance was kept at level 1 throughout each set as well.  First set was completed using BUEs for 4 mins with average RPMs maintained at 19-20.  The last 3 sets were completed with isolated use of the RUE only with min assist overall.  RPMs were maintained at around 7 per minute.  Pt needed mod demonstrational cueing to avoid shoulder hike on the right side. HR at 62 post set with O2 at 96% as well on room air.  Finished session with transfer back to the room and pt left sitting up with the call button and phone in reach and safety belt in place.     Therapy Documentation Precautions:  Precautions Precautions: Fall Precaution Comments: R-sided weakness Restrictions Weight Bearing Restrictions: No   Pain: Pain Assessment Pain Scale: Faces Pain Score: 0-No pain ADL: See Care Tool Section for some details of mobility and selfcare  Therapy/Group: Individual Therapy  Tanny Harnack OTR/L 02/25/2020, 12:37 PM

## 2020-02-25 NOTE — Progress Notes (Signed)
Speech Language Pathology Discharge Summary  Patient Details  Name: Hector [name] MRN: [id number] Date of Birth: 10-01-1940  Today's Date: 02/26/2020 SLP Individual Time: 1131-1155 SLP Individual Time Calculation (min): 24 min   Skilled Therapeutic Interventions:  Pt was seen for skilled ST targeting speech goals. SLP facilitated session with conversation level speech tasks that were also functional to assist pt in anticipating d/c needs. He remained 100% intelligible throughout entire session and was Mod I for use of slow rate and overarticulation strategies. Given that ST goals have been met, no further skilled ST is indicated while inpatient. Pt left sitting in wheelchair with alarm set and needs within reach.   Patient has met 1 of 1 long term goals.  Patient to discharge at overall Modified Independent level.  Reasons goals not met: n/a   Clinical Impression/Discharge Summary:   Pt made excellent, quick functional gains and met 1 out of 1 long term goals this admission. Pt is currently using strategies for speech intelligibility to compensate for mild dysarthria Mod I. He is 100% intelligible in conversation and only persists with mild articulatory imprecision at times with multisyllabic words. However, no impact on intelligibility and pt reports he is no longer bothered by speech and does not see benefit of further interventions (SLP in agreement). Pt is choosing to focus more on PT/OT for remainder of his stay and met all ST goals. Pt education is complete and no follow up ST is indicated.   Care Partner:  Caregiver Able to Provide Assistance: Yes  Type of Caregiver Assistance: Cognitive;Physical  Recommendation:  None  Rationale for SLP Follow Up: Other (comment) (n/a)   Equipment: none   Reasons for discharge: Treatment goals met   Patient/Family Agrees with Progress Made and Goals Achieved: Yes    Arbutus Leas 02/26/2020, 11:57 AM

## 2020-02-25 NOTE — Progress Notes (Signed)
Patient ID: Hector [name], male   DOB: December 27, 1940, 79 y.o.   MRN: [id number] Team Conference Report to Patient/Family  Team Conference discussion was reviewed with the patient and caregiver, including goals, any changes in plan of care and target discharge date.  Patient and caregiver express understanding and are in agreement.  The patient has a target discharge date of 03/11/20.  Dyanne Iha 02/25/2020, 1:47 PM

## 2020-02-25 NOTE — Progress Notes (Signed)
[place] PHYSICAL MEDICINE & REHABILITATION PROGRESS NOTE   Subjective/Complaints: Patient complains of poor sleep, feels that bed is uncomfortable.  No discrete painful areas no problems with incontinence at night per patient report Having daily BMs Review of systems: Denies CP, shortness of breath, nausea, vomiting, diarrhea.   Objective:   No results found. Recent Labs    02/23/20 0530  WBC 8.2  HGB 16.7  HCT 48.8  PLT 198   Recent Labs    02/23/20 0530  NA 137  K 4.0  CL 103  CO2 24  GLUCOSE 135*  BUN 18  CREATININE 1.30*  CALCIUM 8.9    Intake/Output Summary (Last 24 hours) at 02/25/2020 0931 Last data filed at 02/25/2020 0705 Gross per 24 hour  Intake 415 ml  Output 600 ml  Net -185 ml        Physical Exam: Vital Signs Blood pressure 131/70, pulse 66, temperature 98.2 F (36.8 C), temperature source Oral, resp. rate 19, SpO2 96 %.  General: No acute distress Mood and affect are appropriate Heart: Regular rate and rhythm no rubs murmurs or extra sounds Lungs: Clear to auscultation, breathing unlabored, no rales or wheezes Abdomen: Positive bowel sounds, soft nontender to palpation, nondistended Extremities: No clubbing, cyanosis, or edema  Neuro: Alert Motor: LUE/LE: 5/5 proximal distal RUE: 2+/5 proximal distal RLE: Hip flexion, knee extension 3/5, ankle dorsiflexion 2/5  Assessment/Plan: 1. 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  Physiatrist is providing close team supervision and 24 hour management of active medical problems listed below.  Physiatrist and rehab team continue to assess barriers to discharge/monitor patient progress toward functional and medical goals  Care Tool:  Bathing  Bathing activity did not occur: Refused (Declined on this date, however agreeable to bathing at next OT session) Body  parts bathed by patient: Face, Buttocks, Front perineal area   Body parts bathed by helper: Left arm, Buttocks Body parts n/a: Left lower leg, Right lower leg, Left upper leg, Right upper leg, Abdomen, Chest, Left arm, Right arm   Bathing assist Assist Level: Minimal Assistance - Patient > 75% (did not attempt this session)     Upper Body Dressing/Undressing Upper body dressing   What is the patient wearing?: Pull over shirt    Upper body assist Assist Level: Moderate Assistance - Patient 50 - 74%    Lower Body Dressing/Undressing Lower body dressing      What is the patient wearing?: Pants, Underwear/pull up     Lower body assist Assist for lower body dressing: Minimal Assistance - Patient > 75%     Toileting Toileting Toileting Activity did not occur (Clothing management and hygiene only): N/A (no void or bm)  Toileting assist Assist for toileting: Minimal Assistance - Patient > 75% Assistive Device Comment:  (urinal/stedy)   Transfers Chair/bed transfer  Transfers assist     Chair/bed transfer assist level: Minimal Assistance - Patient > 75%     Locomotion Ambulation   Ambulation assist      Assist level: Moderate Assistance - Patient 50 - 74% Assistive device: Hand held assist Max distance: 10'   Walk 10 feet activity   Assist     Assist level: Minimal Assistance - Patient > 75% Assistive device: Lite Gait   Walk 50 feet activity   Assist Walk 50 feet with 2 turns activity did not occur: Safety/medical concerns  Assist level: Minimal  Assistance - Patient > 75% Assistive device: Lite Gait    Walk 150 feet activity   Assist Walk 150 feet activity did not occur: Safety/medical concerns         Walk 10 feet on uneven surface  activity   Assist Walk 10 feet on uneven surfaces activity did not occur: Safety/medical concerns         Wheelchair     Assist Will patient use wheelchair at discharge?: Yes (Per PT long term  goals) Type of Wheelchair: Manual    Wheelchair assist level: Minimal Assistance - Patient > 75% Max wheelchair distance: 150    Wheelchair 50 feet with 2 turns activity    Assist        Assist Level: Minimal Assistance - Patient > 75%   Wheelchair 150 feet activity     Assist      Assist Level: Minimal Assistance - Patient > 75%   Blood pressure 131/70, pulse 66, temperature 98.2 F (36.8 C), temperature source Oral, resp. rate 19, SpO2 96 %.  Medical Problem List and Plan: 1.  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  Antithrombotics: -DVT/anticoagulation:               -antiplatelet therapy: Aspirin 81 mg daily and Plavix 75 mg daily x3 weeks followed by Plavix alone 3. Pain Management: Tylenol as needed. Well controlled 4. Mood: Provide emotional support             -antipsychotic agents: N/A 5. Neuropsych: This patient is capable of making decisions on his own behalf. 6. Skin/Wound Care: Routine skin checks. 7. Fluids/Electrolytes/Nutrition: Routine in and outs. 8.  Hypertension. Tenormin 50 mg daily, Cozaar 25 mg daily Vitals:   02/25/20 0425 02/25/20 0739  BP: 123/64 131/70  Pulse: (!) 58 66  Resp: 19   Temp: 98.2 F (36.8 C)   SpO2: 97% 96%   Increased Cozaar to 50mg  on 10/17  Controlled on 10/18, some lability 10/19 monitor on current dose, would not increase atenolol due to bradycardia 9.  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  BPH with TURP.    PVRs ordered 11.  Hyperlipidemia.  Lipitor 12.  AKI  Creatinine 1.30 on 10/18  Encourage fluids  Continue to monitor   LOS: 5 days A FACE TO FACE EVALUATION WAS PERFORMED  Charlett Blake 02/25/2020, 9:31 AM

## 2020-02-25 NOTE — Progress Notes (Signed)
Physical Therapy Session Note  Patient Details  Name: Hector [name] MRN: [id number] Date of Birth: Dec 30, 1940  Today's Date: 02/25/2020 PT Individual Time: 0920-1003 PT Individual Time Calculation (min): 43 min   Short Term Goals: Week 1:  PT Short Term Goal 1 (Week 1): Pt will transfer to and from [facility] with min assist PT Short Term Goal 2 (Week 1): Pt will ambulate 88ft with mod assist and LRAD PT Short Term Goal 3 (Week 1): Pt will Propell WC with supervision assist x 143ft PT Short Term Goal 4 (Week 1): Pt will consistenly perform bed mobility with CGA  Skilled Therapeutic Interventions/Progress Updates: Pt presented in w/c agreeable to therapy. Pt denies pain at start of session. Pt transported to day room for energy conservation. Performed stand pivot transfer to mat with minA. Participated in STS with LLE on 4in step 2 x 5 for forced use of RLE and RLE strengthening. Pt then transferred to supine and participated in RLE NMR via forced use. Pt performed hamstring pulls 2 x 10 on physioball with emphasis on maintaining neutral knee and controlling knee into extension. Performed SLR 2 x 10 (in small range) with emphasis on achieving full knee extension and maintaining knee extension. Performed SL bridge 2 x 10 with minA for increased R hamstring recruitment. Pt returned to sitting CGA and performed stand pivot transfer to L back to w/c in same manner as prior. Pt transported back to room at end of session and remained in w/c with belt alarm on, call bell within reach and needs met.      Therapy Documentation Precautions:  Precautions Precautions: Fall Precaution Comments: R-sided weakness Restrictions Weight Bearing Restrictions: No General:   Vital Signs:   Pain:     Therapy/Group: Individual Therapy  Tamico Mundo  Taleen Prosser, PTA  02/25/2020, 12:35 PM

## 2020-02-25 NOTE — Progress Notes (Signed)
Physical Therapy Session Note  Patient Details  Name: Hector [name] MRN: [id number] Date of Birth: March 10, 1941  Today's Date: 02/25/2020 PT Individual Time: 1330-1443 PT Individual Time Calculation (min): 73 min   Short Term Goals: Week 1:  PT Short Term Goal 1 (Week 1): Pt will transfer to and from [facility] with min assist PT Short Term Goal 2 (Week 1): Pt will ambulate 9ft with mod assist and LRAD PT Short Term Goal 3 (Week 1): Pt will Propell WC with supervision assist x 189ft PT Short Term Goal 4 (Week 1): Pt will consistenly perform bed mobility with CGA  Skilled Therapeutic Interventions/Progress Updates:    Patient received sitting up in bed, agreeable to PT. He denies pain, but endorses fatigue. PT propelling patient in wc to therapy gym for time management. He was able to ambulate the following distances on the treadmill with LiteGait: 4'32" 148ft at 0.16mph, 4'30" 169ft at 0.33mph, 4'32" for 157ft at 0.71mph. Patient with seated rest break between bouts. Min verbal cuing for improved R dorsiflexion- no physical assist needed to correct. Decreased instances of R toe drag noted today with fatigue compared to previous therapy sessions. Patient ambulating 30ft, 83ft with RW + R modified hand grip + MinA/ModA. As he fatigues, increased R knee buckling requiring up to [place] to maintain. NuStep x10 mins B LE only for improved R LE coordination, full extension and reciprocal movements. Patient returning to bed via stand pivot with MinA, bed alarm on, call light within reach.   Therapy Documentation Precautions:  Precautions Precautions: Fall Precaution Comments: R-sided weakness Restrictions Weight Bearing Restrictions: No    Therapy/Group: Individual Therapy  Karoline Caldwell, PT, DPT, CBIS 02/25/2020, 7:48 AM

## 2020-02-26 ENCOUNTER — Inpatient Hospital Stay (HOSPITAL_COMMUNITY): Payer: Medicare PPO | Admitting: Occupational Therapy

## 2020-02-26 ENCOUNTER — Inpatient Hospital Stay (HOSPITAL_COMMUNITY): Payer: Medicare PPO | Admitting: Speech Pathology

## 2020-02-26 ENCOUNTER — Inpatient Hospital Stay (HOSPITAL_COMMUNITY): Payer: Medicare PPO

## 2020-02-26 NOTE — Progress Notes (Addendum)
Occupational Therapy Session Note  Patient Details  Name: Hector [name] MRN: [id number] Date of Birth: 01-17-1941  Today's Date: 02/26/2020 OT Individual Time: 1300-1416 OT Individual Time Calculation (min): 76 min    Short Term Goals: Week 1:  OT Short Term Goal 1 (Week 1): Pt will complete LB dressing with mod assist while using hemi-dressing technique OT Short Term Goal 2 (Week 1): Pt will complete toilet transfer with min assist OT Short Term Goal 3 (Week 1): Pt will complete bathing with mod assist.  Skilled Therapeutic Interventions/Progress Updates:    Pt completed bathing and dressing sit to stand to start session.  He was able to ambulate to the shower with mod assist and no device to being.  He then removed clothing and completed shower with min assist.  Min assist was needed for integration of the RUE to wash the left arm and shoulder.  He was able to transfer out to the sink for dressing tasks at mod assist as well.  He completed dressing of UB with setup and needed min assist for LB except for donning TEDs, which were max assist.  He was able to donn his shoes with min assist as well.  He was able to complete shaving in standing with min assist to use his electric razor.  Next, he was taken down to the therapy gym where he focused on weightbearing through the RUE in quadriped.  Mod facilitation was needed for support of the right elbow into extension while having him reach with the LUE to pick up and place clothespins.  He then progressed to sitting on the EOB and working on leaning down to the right side to push up from the mat with the RUE and min facilitation to extend the elbow.  He finished session with use of the UE Ranger for activation of elbow extension to target for 10 repetitions of 2 sets.  Returned to the wheelchair with transfer back to the room with the call button and phone in reach and safety belt in place.   Therapy Documentation Precautions:   Precautions Precautions: Fall Precaution Comments: R-sided weakness Restrictions Weight Bearing Restrictions: No  Pain: Pain Assessment Pain Scale: 0-10 Pain Score: 0-No pain ADL: See Care Tool Section for some details of mobility selfcare  Therapy/Group: Individual Therapy  Laquisha Northcraft OTR/L 02/26/2020, 3:43 PM

## 2020-02-26 NOTE — Progress Notes (Signed)
Physical Therapy Session Note  Patient Details  Name: Hector [name] MRN: [id number] Date of Birth: 1941-01-19  Today's Date: 02/26/2020 PT Individual Time: 1000-1057 PT Individual Time Calculation (min): 57 min   Short Term Goals: Week 1:  PT Short Term Goal 1 (Week 1): Pt will transfer to and from [facility] with min assist PT Short Term Goal 2 (Week 1): Pt will ambulate 30ft with mod assist and LRAD PT Short Term Goal 3 (Week 1): Pt will Propell WC with supervision assist x 130ft PT Short Term Goal 4 (Week 1): Pt will consistenly perform bed mobility with CGA  Skilled Therapeutic Interventions/Progress Updates:    Patient received sitting up in wc agreeable to PT. He denies pain, but endorses fatigue. PT propelling patient to therapy gym in wc for time management and energy conservation. He was able to ambulate 43ftx4 with CGA/MinA, RW and modified hand grip. Increasing ability to control R knee, but with fatigue he does still demonstrate R knee buckling. PT trailing R anterior shell AFO to assist with R knee stability. Mild improvement noted with AFO. Discussed with patient pros/cons of adding AFO while still in inpatient rehab. Patient has potential for further neuro return to gastroc soleus and may no require AFO at dc for functional gait. Patient completed seated dorsiflexion exercises first with beanbag, then with 1# and 3# dumbbell. Objective to maintain active dorsiflexion adequate enough to keep beanbag/dumbbell on distal toes. Patient completing same task in standing with CGA and use of RW. Instance of L knee buckling x1 requiring MaxA from PT to maintain balance. Patient with good carryover from dorsiflexion exercises to gait with decrease instance of toe drag noted. Toe taps onto 6" box with emphasis on R toe clearance and R knee stability in stance. B UE support on // bars. Patient will benefit from improved strength training and NMR to R gastroc/soleus complex for more efficient and safe gait  pattern. Patient returning to room in wc, chair alarm on, call light within reach.    Therapy Documentation Precautions:  Precautions Precautions: Fall Precaution Comments: R-sided weakness Restrictions Weight Bearing Restrictions: No    Therapy/Group: Individual Therapy  Karoline Caldwell, PT, DPT, CBIS 02/26/2020, 7:36 AM
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
## 2020-02-26 NOTE — Progress Notes (Signed)
Physical Therapy Session Note  Patient Details  Name: Hector [name] MRN: [id number] Date of Birth: Apr 07, 1941  Today's Date: 02/26/2020 PT Individual Time: 4536-4680 PT Individual Time Calculation (min): 45 min   Short Term Goals: Week 1:  PT Short Term Goal 1 (Week 1): Pt will transfer to and from [facility] with min assist PT Short Term Goal 2 (Week 1): Pt will ambulate 7ft with mod assist and LRAD PT Short Term Goal 3 (Week 1): Pt will Propell WC with supervision assist x 174ft PT Short Term Goal 4 (Week 1): Pt will consistenly perform bed mobility with CGA  Skilled Therapeutic Interventions/Progress Updates:    Patient received supine in bed agreeable to PT. He denies pain, but endorses frustration with having PT first and not being ready for the day. PT discussed with patient that this writer can help the patient get ready for the day have it be therapeutic/ beneficial. He was able to don pants supine with supervision and finish donning pants in standing with CGA. PT applying TED hose, MinA for shoes. He was able to complete morning ADLs at sink, seated, with supervision and set up. PT propelling patient in wc to therapy gym for time management. He ambulated the following distances on the treadmill with LiteGait: 4'16" for 197 ft at 0.4-0.7mph, 3'08" for 187ft at 0.5-0.60mph. Increasing ability to control R knee in stance. With fatigue, patient continues to demonstrate either R genu recurvatum or buckling. R knee buckling likely related to gastroc/soleus weakness as R quads have adequate strength to control depth of buckling. Patient then ambulating 35ft overground with RW, modified hand grip + MinA/ModA. Patient requires verbal cues to slow down gait speed and not power through poor gait mechanics. ModA needed as patient fatigued due to increasing instance of R knee buckling. Patient returning to room in wc, seatbelt alarm on, call light within reach.   Therapy Documentation Precautions:   Precautions Precautions: Fall Precaution Comments: R-sided weakness Restrictions Weight Bearing Restrictions: No    Therapy/Group: Individual Therapy  Karoline Caldwell, PT, DPT, CBIS 02/26/2020, 7:37 AM

## 2020-02-26 NOTE — Plan of Care (Signed)
°  Problem: Consults Goal: RH STROKE PATIENT EDUCATION Description: See Patient Education module for education specifics  Outcome: Progressing   Problem: RH BOWEL ELIMINATION Goal: RH STG MANAGE BOWEL W/MEDICATION W/ASSISTANCE Description: STG Manage Bowel with Medication with mod I Assistance. Outcome: Progressing   Problem: RH BLADDER ELIMINATION Goal: RH STG MANAGE BLADDER WITH EQUIPMENT WITH ASSISTANCE Description: STG Manage Bladder With Equipment With mod I Assistance Outcome: Progressing   Problem: RH SKIN INTEGRITY Goal: RH STG MAINTAIN SKIN INTEGRITY WITH ASSISTANCE Description: STG Maintain Skin Integrity With mod I Assistance. Outcome: Progressing   Problem: RH SAFETY Goal: RH STG ADHERE TO SAFETY PRECAUTIONS W/ASSISTANCE/DEVICE Description: STG Adhere to Safety Precautions With cues and reminders. Outcome: Progressing Goal: RH STG DECREASED RISK OF FALL WITH ASSISTANCE Description: STG Decreased Risk of Fall With cues and reminders Outcome: Progressing   Problem: RH COGNITION-NURSING Goal: RH STG USES MEMORY AIDS/STRATEGIES W/ASSIST TO PROBLEM SOLVE Description: STG Uses Memory Aids/Strategies With reminder cues and reminders Assistance to Problem Solve. Outcome: Progressing Goal: RH STG ANTICIPATES NEEDS/CALLS FOR ASSIST W/ASSIST/CUES Description: STG Anticipates Needs/Calls for Assist With cues and reminders Outcome: Progressing   Problem: RH PAIN MANAGEMENT Goal: RH STG PAIN MANAGED AT OR BELOW PT'S PAIN GOAL Description: Pain level less than 4 on scale of 0-10 Outcome: Progressing   Problem: RH KNOWLEDGE DEFICIT Goal: RH STG INCREASE KNOWLEDGE OF HYPERTENSION Description: Pt will be able to demonstrate understanding of medication regimen, dietary and lifestyle modifications to better control hypertension and prevent stroke with mod I assist using handouts and booklet provided.  Outcome: Progressing Goal: RH STG INCREASE KNOWLEDGE OF STROKE  PROPHYLAXIS Description: Pt will be able to demonstrate understanding of medication regimen, dietary and lifestyle modifications to better control hypertension and prevent stroke with mod I assist using handouts and booklet provided.  Outcome: Progressing

## 2020-02-27 ENCOUNTER — Inpatient Hospital Stay (HOSPITAL_COMMUNITY): Payer: Medicare PPO | Admitting: Occupational Therapy

## 2020-02-27 ENCOUNTER — Inpatient Hospital Stay (HOSPITAL_COMMUNITY): Payer: Medicare PPO | Admitting: Physical Therapy

## 2020-02-27 NOTE — Progress Notes (Signed)
[place] PHYSICAL MEDICINE & REHABILITATION PROGRESS NOTE   Subjective/Complaints: No issues overnite , denies heartburn  Review of systems: Denies CP, shortness of breath, nausea, vomiting, diarrhea.   Objective:   No results found. No results for input(s): WBC, HGB, HCT, PLT in the last 72 hours. No results for input(s): NA, K, CL, CO2, GLUCOSE, BUN, CREATININE, CALCIUM in the last 72 hours.  Intake/Output Summary (Last 24 hours) at 02/27/2020 0824 Last data filed at 02/27/2020 0432 Gross per 24 hour  Intake 236 ml  Output 600 ml  Net -364 ml        Physical Exam: Vital Signs Blood pressure 136/77, pulse 60, temperature 98.1 F (36.7 C), temperature source Oral, resp. rate 18, SpO2 96 %.  General: No acute distress Mood and affect are appropriate Heart: Regular rate and rhythm no rubs murmurs or extra sounds Lungs: Clear to auscultation, breathing unlabored, no rales or wheezes Abdomen: Positive bowel sounds, soft nontender to palpation, nondistended Extremities: No clubbing, cyanosis, or edema Skin: No evidence of breakdown, no evidence of rash  Neuro: Alert Motor: LUE/LE: 5/5 proximal distal RUE: 2+/5 proximal distal RLE: Hip flexion, knee extension 3/5, ankle dorsiflexion 2/5  Assessment/Plan: 1. 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  Physiatrist is providing close team supervision and 24 hour management of active medical problems listed below.  Physiatrist and rehab team continue to assess barriers to discharge/monitor patient progress toward functional and medical goals  Care Tool:  Bathing  Bathing activity did not occur: Refused (Declined on this date, however agreeable to bathing at next OT session) Body parts bathed by patient: Right arm, Left arm, Chest, Abdomen, Front perineal area, Buttocks, Right upper leg, Left upper  leg, Left lower leg, Right lower leg, Face   Body parts bathed by helper: Left arm, Buttocks Body parts n/a: Left lower leg, Right lower leg, Left upper leg, Right upper leg, Abdomen, Chest, Left arm, Right arm   Bathing assist Assist Level: Minimal Assistance - Patient > 75%     Upper Body Dressing/Undressing Upper body dressing   What is the patient wearing?: Pull over shirt    Upper body assist Assist Level: Set up assist    Lower Body Dressing/Undressing Lower body dressing      What is the patient wearing?: Pants, Underwear/pull up     Lower body assist Assist for lower body dressing: Minimal Assistance - Patient > 75%     Toileting Toileting Toileting Activity did not occur (Clothing management and hygiene only): N/A (no void or bm)  Toileting assist Assist for toileting: Minimal Assistance - Patient > 75% Assistive Device Comment:  (urinal/stedy)   Transfers Chair/bed transfer  Transfers assist     Chair/bed transfer assist level: Contact Guard/Touching assist Chair/bed transfer assistive device: Armrests   Locomotion Ambulation   Ambulation assist      Assist level: Minimal Assistance - Patient > 75% Assistive device: Haran-rolling Max distance: 36   Walk 10 feet activity   Assist     Assist level: Minimal Assistance - Patient > 75% Assistive device: Guedes-rolling   Walk 50 feet activity   Assist Walk 50 feet with 2 turns activity did not occur: Safety/medical concerns  Assist level: Moderate Assistance - Patient - 50 - 74% Assistive device: Lasky-rolling    Walk 150 feet activity   Assist Walk 150 feet activity did not occur: Safety/medical concerns  Walk 10 feet on uneven surface  activity   Assist Walk 10 feet on uneven surfaces activity did not occur: Safety/medical concerns         Wheelchair     Assist Will patient use wheelchair at discharge?: Yes (Per PT long term goals) Type of Wheelchair:  Manual    Wheelchair assist level: Minimal Assistance - Patient > 75% Max wheelchair distance: 150    Wheelchair 50 feet with 2 turns activity    Assist        Assist Level: Minimal Assistance - Patient > 75%   Wheelchair 150 feet activity     Assist      Assist Level: Minimal Assistance - Patient > 75%   Blood pressure 136/77, pulse 60, temperature 98.1 F (36.7 C), temperature source Oral, resp. rate 18, SpO2 96 %.  Medical Problem List and Plan: 1.  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  Antithrombotics: -DVT/anticoagulation:               -antiplatelet therapy: Aspirin 81 mg daily and Plavix 75 mg daily x3 weeks followed by Plavix alone 3. Pain Management: Tylenol as needed. Well controlled 4. Mood: Provide emotional support             -antipsychotic agents: N/A 5. Neuropsych: This patient is capable of making decisions on his own behalf. 6. Skin/Wound Care: Routine skin checks. 7. Fluids/Electrolytes/Nutrition: Routine in and outs. I 523ml O 923ml 8.  Hypertension. Tenormin 50 mg daily, Cozaar 25 mg daily Vitals:   02/26/20 1949 02/27/20 0416  BP: 136/74 136/77  Pulse: 68 60  Resp:  18  Temp: 97.8 F (36.6 C) 98.1 F (36.7 C)  SpO2: 95% 96%   Controlled since Increased Cozaar to 50mg  on 10/17   9.  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  BPH with TURP.    PVRs ordered 11.  Hyperlipidemia.  Lipitor 12.  AKI  Creatinine 1.30 on 10/18  Encourage fluids  Continue to monitor  13.  Hx GERD, used carafate at home may resume if having daily sx LOS: 7 days A FACE TO FACE EVALUATION WAS PERFORMED  Hector [name] 02/27/2020, 8:24 AM

## 2020-02-27 NOTE — Progress Notes (Signed)
Physical Therapy Weekly Progress Note  Patient Details  Name: Hector [name] MRN: [id number] Date of Birth: 09/29/1940  Beginning of progress report period: February 21, 2020 End of progress report period: February 27, 2020  Today's Date: 02/27/2020 PT Individual Time: 8756-4332 PT Individual Time Calculation (min): 58 min   Patient has met 3 of 3 short term goals (disontined w/c propulsion STG based on pt's progress with gait training). Hector [name] is progressing well with therapy demonstrating increasing independence with functional mobility. He is performing supine<>sit with CGA, sit<>stand and stand pivot transfers with min/mod assist, and ambulating up to 65ft using RW with mod assist. He is able to advance R LE during swing though lacks heel strike on initial contact and demos R knee slightly flexed during stance with pt limiting quad muscle activation due to it causing knee hyperextension. He continues to demonstrate R LE paresis with most significant impairment in ankle DF/PF with at most 2-/5 MMT scores impacting gait mechanics and standing balance.  Patient continues to demonstrate the following deficits muscle weakness, muscle joint tightness and muscle paralysis, decreased cardiorespiratoy endurance, impaired timing and sequencing, abnormal tone, unbalanced muscle activation and motor apraxia and decreased sitting balance, decreased standing balance, decreased postural control, hemiplegia and decreased balance strategies and therefore will continue to benefit from skilled PT intervention to increase functional independence with mobility.  Patient progressing toward long term goals..  Continue plan of care.  PT Short Term Goals Week 1:  PT Short Term Goal 1 (Week 1): Pt will transfer to and from [facility] with min assist PT Short Term Goal 1 - Progress (Week 1): Met PT Short Term Goal 2 (Week 1): Pt will ambulate 39ft with mod assist and LRAD PT Short Term Goal 2 - Progress (Week 1): Met PT  Short Term Goal 3 (Week 1): Pt will Propell WC with supervision assist x 140ft PT Short Term Goal 3 - Progress (Week 1): Discontinued (comment) (have not focused on w/c mobility due to pt progress with ambulation) PT Short Term Goal 4 (Week 1): Pt will consistenly perform bed mobility with CGA PT Short Term Goal 4 - Progress (Week 1): Met Week 2:  PT Short Term Goal 1 (Week 2): Pt will perform sit<>stands using LRAD with CGA PT Short Term Goal 2 (Week 2): Pt will perform stand pivot transfers using LRAD with CGA PT Short Term Goal 3 (Week 2): Pt will ambulate at least 174ft using LRAD with min assist PT Short Term Goal 4 (Week 2): Pt will ascend/descend 4 steps using HRs with min assist  Skilled Therapeutic Interventions/Progress Updates:    Pt received supine in bed and eager to participate in therapy session. Supine>sitting L EOB, HOB flat but using bedrail as needed, with close supervision. R stand pivot EOB>w/c, no AD, with min assist for lifting and balance while turning  Transported to/from gym in w/c for time management and energy conservation. Gait training ~59ft using RW (R UE orthotic) with mod assist for balance - demos ability to clear R foot during swing with increased hip/knee flexion until end of gait when becoming fatigued then catching toes (continues to lack adequate ankle DF muscle activation) - demos R knee slightly flexed throughout but not buckling - demos large step lengths causing some instability due to impaired balance, which resulted in need for increased assist. Transported to day room.  Stepped on/off treadmill using B UE support on litegait rail with heavy min assist for balance - pt stepping forward  up with R LE and down backwards with L LE with no R knee buckle but instability due to paresis. Standing with B UE support on litegait donned harness and then donned R LE ankle DF assist ACE wrap.  Performed the following locomotor treadmill training trials using litegait for  safety but no true partial BWS provided: - 74min23seconds at 1.26mph increased to 1.25mph totaling 525ft - pt demos R knee sustained in slight flexion during stance, decreased R stance time, and L knee flexed on initial contact anticipate due to impaired R LE stance stability  - 13min at 1.40mph increased to 1.46mph totaling 528ft - donned R Swedish knee cage to prevent pt from hyperextending but still allowing proper quad activation with pt demoing some improvement (need to continue addressing this as pt starting to develop compensatory gait mechanics keeping R knee slightly flexed in stance to avoid hyperextension - will need to continue to address gastroc/soleus strength as well as able) Therapist providing verbal cues and intermittent facilitation for weight shifting to improve gait mechanics. Doffed harness and stepped off treadmill as described above. Transported back to room and left seated in w/c with needs in reach, chair alarm on, and R UE supported on 1/2 lap tray.   Therapy Documentation Precautions:  Precautions Precautions: Fall Precaution Comments: R-sided weakness Restrictions Weight Bearing Restrictions: No  Pain:   No reports of pain throughout session.  Therapy/Group: Individual Therapy   Tawana Scale , PT, DPT, CSRS  02/27/2020, 7:56 AM

## 2020-02-27 NOTE — Progress Notes (Signed)
Occupational Therapy Session Note  Patient Details  Name: Hector [name] MRN: [id number] Date of Birth: 07/07/1940  Today's Date: 02/27/2020 OT Individual Time: 1415-1520 OT Individual Time Calculation (min): 65 min    Short Term Goals: Week 1:  OT Short Term Goal 1 (Week 1): Pt will complete LB dressing with mod assist while using hemi-dressing technique OT Short Term Goal 1 - Progress (Week 1): Met OT Short Term Goal 2 (Week 1): Pt will complete toilet transfer with min assist OT Short Term Goal 2 - Progress (Week 1): Met OT Short Term Goal 3 (Week 1): Pt will complete bathing with mod assist. OT Short Term Goal 3 - Progress (Week 1): Met Week 2:  OT Short Term Goal 1 (Week 2): Pt will complete LB bathing sit to stand with min guard assist. OT Short Term Goal 2 (Week 2): Pt will complete LB dressing min min guard assist sit to stand. OT Short Term Goal 3 (Week 2): Pt will complete toilet transfers with min guard assist using the RW for support. OT Short Term Goal 4 (Week 2): Pt will use the RUE as an active assist during grooming and bathing tasks with supervision.      Skilled Therapeutic Interventions/Progress Updates:    pt received in wc ready for therapy. Pt transported to gym to focus on RUE functional movement.  Pt completed stand pivot to mat to his R with CGA to min A.  Sitting on mat observed pt using compensatory strategies to lift R arm by elevating shoulder, spent time working on chest expansion stretches, scapular retraction, posture. Worked quite a bit on shoulder A/arom with reaching at chest height or lower with increasing his awareness of compensatory strategies.  For isolated RUE worked on grasp release of small objects with a focus on a/arom for finger extensors, grasp release of yoga block for increased web space stretch. With these activites active A motions for reaching forward and to the side.   B hands for functional motions of holding objects with stabilizing  assist to R hand. Holding yoga block with B hands pressed on either side of block. This was difficult for him due to limited isometric finger strength.  B hands on ball with passing ball back and forth to therapist. Attempted a bounce catch motion but pt only able to catch ball 25% of the time.    B hands on dowel bar with torso rotation and forward reaching.   Pt worked on challenging dynamic stand balance and B hand strength activity of reaching forward to pick up a rectangular laundry basket with both hands, placed it on his lap and then stood up to place on a low bench then stood up to reach L arm overhead and sit without UE support 5 x for 3 sets.  He only needed CGA.  He did very well with this activity.  For isolated finger extension, pt can stretch fingers out actively with towel slides but needs a/arom for extension without gravity eliminated.  Pt transferred back to wc then back to bed in room.  Excellent participation. Bed alarm set and all needs met.   Therapy Documentation Precautions:  Precautions Precautions: Fall Precaution Comments: R-sided weakness Restrictions Weight Bearing Restrictions: No   Pain:  No c/o pain      Therapy/Group: Individual Therapy  SAGUIER,JULIA 02/27/2020, 3:29 PM  

## 2020-02-27 NOTE — Progress Notes (Addendum)
Occupational Therapy Weekly Progress Note  Patient Details  Name: Hector [name] MRN: [id number] Date of Birth: 06-07-1940  Beginning of progress report period: February 21, 2020 End of progress report period: February 27, 2020  Today's Date: 02/27/2020 OT Individual Time: 1101-1200 OT Individual Time Calculation (min): 59 min    Patient has met 3 of 3 short term goals.  Mr. Witzke is making steady progress with OT at this time.  He is completing UB selfcare with supervision and needs only min assist for LB selfcare.  He is able to complete functional transfers to the toilet as well as the walk-in shower with min assist using the RW for support or stand pivot without.  He continues to demonstrate RUE and RLE hemiparesis and currently uses the RUE at a active assist level with bathing tasks at min assist, but can hold objects to be opened with supervision.  Mod assist is needed if trying to pull up clothing.  Gross digit flexion is present at approximately 70% with digit extension at 60-70% as well.  Feel he is on target for established supervision level goals with expected discharge on 11/4.  Recommend continued OT until at CIR level at this time.    Patient continues to demonstrate the following deficits: muscle weakness and muscle paralysis, impaired timing and sequencing, unbalanced muscle activation and decreased coordination and decreased sitting balance, decreased standing balance, hemiplegia and decreased balance strategies and therefore will continue to benefit from skilled OT intervention to enhance overall performance with BADL and Reduce care partner burden.  Patient progressing toward long term goals..  Continue plan of care.  OT Short Term Goals Week 2:  OT Short Term Goal 1 (Week 2): Pt will complete LB bathing sit to stand with min guard assist. OT Short Term Goal 2 (Week 2): Pt will complete LB dressing min min guard assist sit to stand. OT Short Term Goal 3 (Week 2): Pt will  complete toilet transfers with min guard assist using the RW for support. OT Short Term Goal 4 (Week 2): Pt will use the RUE as an active assist during grooming and bathing tasks with supervision.  Skilled Therapeutic Interventions/Progress Updates:    Pt in wheelchair to start session, agreeable to working on RUE strengthening and neuromuscular re-education.  He was taken down to the therapy gym where he transferred to the therapy mat with overall min assist.  Had him work in sitting on RUE weightbearing for transitions from laying on the right forearm to sitting back upright.  He then transitioned to supine with right hand ace bandaged to a hula hoop for work on shoulder flexion using the LUE for assistance with the right AAROM. He needed min facilitation to achieve 90 degrees shoulder flexion and maintain and hold for intervals of 5 seconds.  Next had him transition to sitting and work on sit to squat transition to increased RUE and RLE weightbearing while incorporating functional reach with the LUE.  He needed min assist to maintain squat position for completion of picking up and placing cards from one stack to the other.  Finished by working on picking up balled up paper towels from the mat beside of him and placing them on a stool or table in front of him.  Min assist for completion of task for multiple attempts.  Pt returned to the room with call button and phone in reach and safety belt in place.     Therapy Documentation Precautions Precautions Precautions: Fall Precaution Comments:  R-sided weakness Restrictions Weight Bearing Restrictions: No   Pain: Pain Assessment Pain Scale: Faces Pain Score: 0-No pain ADL: See Care Tool Section for some details of mobility and selfcare  Therapy/Group: Individual Therapy  Laylani Pudwill OTR/L 02/27/2020, 1:03 PM

## 2020-02-28 ENCOUNTER — Inpatient Hospital Stay (HOSPITAL_COMMUNITY): Payer: Medicare PPO | Admitting: Physical Therapy

## 2020-02-28 ENCOUNTER — Inpatient Hospital Stay (HOSPITAL_COMMUNITY): Payer: Medicare PPO

## 2020-02-28 DIAGNOSIS — K219 Gastro-esophageal reflux disease without esophagitis: Secondary | ICD-10-CM

## 2020-02-28 NOTE — Progress Notes (Signed)
Occupational Therapy Session Note  Patient Details  Name: [name] MRN: [id number] Date of Birth: [birth year]  Today's Date: 02/28/2020 OT Individual Time: 1000-1057 OT Individual Time Calculation (min): 57 min    Short Term Goals: Week 1:  OT Short Term Goal 1 (Week 1): Pt will complete LB dressing with mod assist while using hemi-dressing technique OT Short Term Goal 1 - Progress (Week 1): Met OT Short Term Goal 2 (Week 1): Pt will complete toilet transfer with min assist OT Short Term Goal 2 - Progress (Week 1): Met OT Short Term Goal 3 (Week 1): Pt will complete bathing with mod assist. OT Short Term Goal 3 - Progress (Week 1): Met  Skilled Therapeutic Interventions/Progress Updates:    Pt received in w/c with 0out of 10 pain  ADL:  Pt completes bathing with at shower level seated and standing with MIN A to wash L armpit with HOH A of RUE for NMR. Grab bar used for standing balance with S and WB into bar on RUE Pt completes UB dressing with 1VC for pushing fabrid over R shoudler Pt completes LB dressing with with S at sit to stand level. No VC for hemi dressing Pt completes footwear with MIN A for donning teds and VC for crossing into figure 4 to don R shoe/use of shoe horn  Pt completes shower/Tub transfer with CGA during stand pivot transfer w/c<>TTB   Therapeutic activity Pt completes seated and standing connect 4 with pt weight bearing and weight shifting R in seated on outside park bench reaching for connect 4 pieces for deep input into UB joints during 2 seated rounds of game. Pt requires CGA for standing balance and MINI squat position wit RW playing standing rounds d/t tall stature and connect 4 game on low surface of w/cseat to place piece. Pt demo good quad control/activation in squat stance.  Pt left at end of session in w/c with exit alarm on, call light in reach and all needs met   Therapy Documentation Precautions:  Precautions Precautions:  Fall Precaution Comments: R-sided weakness Restrictions Weight Bearing Restrictions: No General:   Vital Signs: Therapy Vitals Temp: 98.3 F (36.8 C) Pulse Rate: (!) 56 BP: 137/70 Patient Position (if appropriate): Lying Oxygen Therapy SpO2: 95 % O2 Device: Room Air Pain:   ADL:   Vision   Perception    Praxis   Exercises:   Other Treatments:     Therapy/Group: Individual Therapy  Tonny Branch 02/28/2020, 7:02 AM

## 2020-02-28 NOTE — Progress Notes (Signed)
Hector [name] PROGRESS NOTE   Subjective/Complaints: Patient seen laying in bed this morning.  He states that he slept well overnight.  He states he feels like he may be starting to get congested but not yet.  Review of systems: Denies CP, SOB, N/V/D  Objective:   No results found. No results for input(s): WBC, HGB, HCT, PLT in the last 72 hours. No results for input(s): NA, K, CL, CO2, GLUCOSE, BUN, CREATININE, CALCIUM in the last 72 hours.  Intake/Output Summary (Last 24 hours) at 02/28/2020 0922 Last data filed at 02/27/2020 1834 Gross per 24 hour  Intake 354 ml  Output 150 ml  Net 204 ml        Physical Exam: Vital Signs Blood pressure 133/68, pulse 62, temperature 97.7 F (36.5 C), temperature source Oral, resp. rate 15, SpO2 95 %. Constitutional: No distress . Vital signs reviewed. HENT: Normocephalic.  Atraumatic. Eyes: EOMI. No discharge. Cardiovascular: No JVD.  RRR. Respiratory: Normal effort.  No stridor.  Bilateral clear to auscultation. GI: Non-distended.  BS +. Skin: Warm and dry.  Intact. Psych: Normal mood.  Normal behavior. Musc: No edema in extremities.  No tenderness in extremities. Neuro: Alert Motor: LUE/LE: 5/5 proximal distal, stable RUE: 2+/5 proximal distal RLE: Hip flexion, knee extension 3/5, ankle dorsiflexion 2/5  Assessment/Plan: 1. 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  Physiatrist is providing close team supervision and 24 hour management of active medical problems listed below.  Physiatrist and rehab team continue to assess barriers to discharge/monitor patient progress toward functional and medical goals  Care Tool:  Bathing  Bathing activity did not occur: Refused (Declined on this date, however agreeable to bathing at next OT session) Body parts bathed by patient:  Right arm, Left arm, Chest, Abdomen, Front perineal area, Buttocks, Right upper leg, Left upper leg, Left lower leg, Right lower leg, Face   Body parts bathed by helper: Left arm, Buttocks Body parts n/a: Left lower leg, Right lower leg, Left upper leg, Right upper leg, Abdomen, Chest, Left arm, Right arm   Bathing assist Assist Level: Minimal Assistance - Patient > 75%     Upper Body Dressing/Undressing Upper body dressing   What is the patient wearing?: Pull over shirt    Upper body assist Assist Level: Set up assist    Lower Body Dressing/Undressing Lower body dressing      What is the patient wearing?: Pants, Underwear/pull up     Lower body assist Assist for lower body dressing: Minimal Assistance - Patient > 75%     Toileting Toileting Toileting Activity did not occur (Clothing management and hygiene only): N/A (no void or bm)  Toileting assist Assist for toileting: Minimal Assistance - Patient > 75% Assistive Device Comment:  (urinal/stedy)   Transfers Chair/bed transfer  Transfers assist     Chair/bed transfer assist level: Minimal Assistance - Patient > 75% Chair/bed transfer assistive device: Armrests   Locomotion Ambulation   Ambulation assist      Assist level: Moderate Assistance - Patient 50 - 74% Assistive device: Moorer-rolling Max distance: 58ft   Walk 10 feet activity   Assist     Assist level: Moderate Assistance - Patient - 50 - 74% Assistive device: Lank-rolling   Walk 50 feet activity   Assist Walk 50 feet with 2 turns activity did not occur: Safety/medical concerns  Assist level: Moderate Assistance -  Patient - 50 - 74% Assistive device: Connolly-rolling    Walk 150 feet activity   Assist Walk 150 feet activity did not occur: Safety/medical concerns         Walk 10 feet on uneven surface  activity   Assist Walk 10 feet on uneven surfaces activity did not occur: Safety/medical concerns          Wheelchair     Assist Will patient use wheelchair at discharge?: Yes (Per PT long term goals) Type of Wheelchair: Manual    Wheelchair assist level: Minimal Assistance - Patient > 75% Max wheelchair distance: 150    Wheelchair 50 feet with 2 turns activity    Assist        Assist Level: Minimal Assistance - Patient > 75%   Wheelchair 150 feet activity     Assist      Assist Level: Minimal Assistance - Patient > 75%   Blood pressure 133/68, pulse 62, temperature 97.7 F (36.5 C), temperature source Oral, resp. rate 15, SpO2 95 %.  Medical Problem List and Plan: 1.  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  Antithrombotics: -DVT/anticoagulation:               -antiplatelet therapy: Aspirin 81 mg daily and Plavix 75 mg daily x3 weeks followed by Plavix alone 3. Pain Management: Tylenol as needed. Well controlled 4. Mood: Provide emotional support             -antipsychotic agents: N/A 5. Neuropsych: This patient is capable of making decisions on his own behalf. 6. Skin/Wound Care: Routine skin checks. 7. Fluids/Electrolytes/Nutrition: Routine in and outs. 8.  Hypertension. Tenormin 50 mg daily, Cozaar 25 mg daily, increase to 50 Vitals:   02/28/20 0446 02/28/20 0802  BP: 137/70 133/68  Pulse: (!) 56 62  Resp:  15  Temp: 98.3 F (36.8 C) 97.7 F (36.5 C)  SpO2: 95% 95%   Relatively controlled on 10/23  9.  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  BPH with TURP.    Latest PVRs without retention 11.  Hyperlipidemia.  Lipitor 12.  AKI  Creatinine 1.30 on 10/18, labs ordered for Monday  Encourage fluids  Continue to monitor 13.  Hx GERD, used carafate at home may resume if having daily sx  Improving  LOS: 8 days A FACE TO FACE EVALUATION WAS PERFORMED  Hector [name] Hector [name] 02/28/2020, 9:22 AM

## 2020-02-28 NOTE — Plan of Care (Signed)
  Problem: Consults Goal: RH STROKE PATIENT EDUCATION Description: See Patient Education module for education specifics  Outcome: Progressing   Problem: RH BOWEL ELIMINATION Goal: RH STG MANAGE BOWEL W/MEDICATION W/ASSISTANCE Description: STG Manage Bowel with Medication with mod I Assistance. Outcome: Progressing   Problem: RH BLADDER ELIMINATION Goal: RH STG MANAGE BLADDER WITH EQUIPMENT WITH ASSISTANCE Description: STG Manage Bladder With Equipment With mod I Assistance Outcome: Progressing   Problem: RH SKIN INTEGRITY Goal: RH STG MAINTAIN SKIN INTEGRITY WITH ASSISTANCE Description: STG Maintain Skin Integrity With mod I Assistance. Outcome: Progressing   Problem: RH SAFETY Goal: RH STG ADHERE TO SAFETY PRECAUTIONS W/ASSISTANCE/DEVICE Description: STG Adhere to Safety Precautions With cues and reminders. Outcome: Progressing Goal: RH STG DECREASED RISK OF FALL WITH ASSISTANCE Description: STG Decreased Risk of Fall With cues and reminders Outcome: Progressing   Problem: RH COGNITION-NURSING Goal: RH STG USES MEMORY AIDS/STRATEGIES W/ASSIST TO PROBLEM SOLVE Description: STG Uses Memory Aids/Strategies With reminder cues and reminders Assistance to Problem Solve. Outcome: Progressing Goal: RH STG ANTICIPATES NEEDS/CALLS FOR ASSIST W/ASSIST/CUES Description: STG Anticipates Needs/Calls for Assist With cues and reminders Outcome: Progressing   Problem: RH PAIN MANAGEMENT Goal: RH STG PAIN MANAGED AT OR BELOW PT'S PAIN GOAL Description: Pain level less than 4 on scale of 0-10 Outcome: Progressing   Problem: RH KNOWLEDGE DEFICIT Goal: RH STG INCREASE KNOWLEDGE OF HYPERTENSION Description: Pt will be able to demonstrate understanding of medication regimen, dietary and lifestyle modifications to better control hypertension and prevent stroke with mod I assist using handouts and booklet provided.  Outcome: Progressing Goal: RH STG INCREASE KNOWLEDGE OF STROKE  PROPHYLAXIS Description: Pt will be able to demonstrate understanding of medication regimen, dietary and lifestyle modifications to better control hypertension and prevent stroke with mod I assist using handouts and booklet provided.  Outcome: Progressing

## 2020-02-28 NOTE — Progress Notes (Signed)
Physical Therapy Session Note  Patient Details  Name: Hector [name] MRN: [id number] Date of Birth: 04/05/1941  Today's Date: 02/28/2020 PT Individual Time: 1447-1530 PT Individual Time Calculation (min): 43 min   Short Term Goals: Week 1:  PT Short Term Goal 1 (Week 1): Pt will transfer to and from [facility] with min assist PT Short Term Goal 1 - Progress (Week 1): Met PT Short Term Goal 2 (Week 1): Pt will ambulate 92ft with mod assist and LRAD PT Short Term Goal 2 - Progress (Week 1): Met PT Short Term Goal 3 (Week 1): Pt will Propell WC with supervision assist x 146ft PT Short Term Goal 3 - Progress (Week 1): Discontinued (comment) (have not focused on w/c mobility due to pt progress with ambulation) PT Short Term Goal 4 (Week 1): Pt will consistenly perform bed mobility with CGA PT Short Term Goal 4 - Progress (Week 1): Met  Skilled Therapeutic Interventions/Progress Updates:  Pt was seen bedside in the pm. Pt transported to rehab gym. Pt performed multiple sit to stand and stand pivot transfers with rolling Pagan and min A. Treatment focused on NMR, step taps 3 sets x 10 reps each. Pt ambulated 75 feet x 2 with rolling Mault and mod A with verbal cues. Pt returned to room. Pt transferred wc to edge bed with min A and verbal cues. Pt transferred edge of bed to supine with S. Pt left sitting up in bed with all needs within reach and bed alarm on.   Therapy Documentation Precautions:  Precautions Precautions: Fall Precaution Comments: R-sided weakness Restrictions Weight Bearing Restrictions: No General:   Pain: No c/o pain.   Therapy/Group: Individual Therapy  Dub Amis 02/28/2020, 3:40 PM

## 2020-02-29 ENCOUNTER — Inpatient Hospital Stay (HOSPITAL_COMMUNITY): Payer: Medicare PPO

## 2020-02-29 DIAGNOSIS — G8191 Hemiplegia, unspecified affecting right dominant side: Secondary | ICD-10-CM

## 2020-02-29 DIAGNOSIS — E785 Hyperlipidemia, unspecified: Secondary | ICD-10-CM

## 2020-02-29 DIAGNOSIS — K219 Gastro-esophageal reflux disease without esophagitis: Secondary | ICD-10-CM

## 2020-02-29 NOTE — Progress Notes (Signed)
[facility] PHYSICAL MEDICINE & REHABILITATION PROGRESS NOTE   Subjective/Complaints: Patient seen laying in bed this morning.  He states he did not sleep well overnight BKS he had a dream in which she was short of breath.  He is wearing his orthoses.  Review of systems: Denies CP, SOB, N/V/D  Objective:   No results found. No results for input(s): WBC, HGB, HCT, PLT in the last 72 hours. No results for input(s): NA, K, CL, CO2, GLUCOSE, BUN, CREATININE, CALCIUM in the last 72 hours.  Intake/Output Summary (Last 24 hours) at 02/29/2020 0845 Last data filed at 02/29/2020 0505 Gross per 24 hour  Intake 594 ml  Output 1350 ml  Net -756 ml        Physical Exam: Vital Signs Blood pressure (!) 141/67, pulse 60, temperature 98.1 F (36.7 C), resp. rate 20, SpO2 97 %. Constitutional: No distress . Vital signs reviewed. HENT: Normocephalic.  Atraumatic. Eyes: EOMI. No discharge. Cardiovascular: No JVD.  RRR. Respiratory: Normal effort.  No stridor.  Bilateral clear to auscultation. GI: Non-distended.  BS +. Skin: Warm and dry.  Intact. Psych: Normal mood.  Normal behavior. Musc: No edema in extremities.  No tenderness in extremities. Neuro: Alert Motor: LUE/LE: 5/5 proximal distal, stable RUE: 2+/5 proximal distal RLE: Hip flexion, knee extension 3/5, ankle dorsiflexion 2/5, improving  Assessment/Plan: 1. 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  Physiatrist is providing close team supervision and 24 hour management of active medical problems listed below.  Physiatrist and rehab team continue to assess barriers to discharge/monitor patient progress toward functional and medical goals  Care Tool:  Bathing  Bathing activity did not occur: Refused (Declined on this date, however agreeable to bathing at next OT session) Body parts bathed by patient:  Right arm, Left arm, Chest, Abdomen, Front perineal area, Buttocks, Right upper leg, Left upper leg, Left lower leg, Right lower leg, Face   Body parts bathed by helper: Left arm, Buttocks Body parts n/a: Left lower leg, Right lower leg, Left upper leg, Right upper leg, Abdomen, Chest, Left arm, Right arm   Bathing assist Assist Level: Minimal Assistance - Patient > 75%     Upper Body Dressing/Undressing Upper body dressing   What is the patient wearing?: Pull over shirt    Upper body assist Assist Level: Set up assist    Lower Body Dressing/Undressing Lower body dressing      What is the patient wearing?: Pants, Underwear/pull up     Lower body assist Assist for lower body dressing: Minimal Assistance - Patient > 75%     Toileting Toileting Toileting Activity did not occur (Clothing management and hygiene only): N/A (no void or bm)  Toileting assist Assist for toileting: Minimal Assistance - Patient > 75% Assistive Device Comment:  (urinal/stedy)   Transfers Chair/bed transfer  Transfers assist     Chair/bed transfer assist level: Minimal Assistance - Patient > 75% Chair/bed transfer assistive device: Armrests   Locomotion Ambulation   Ambulation assist      Assist level: Moderate Assistance - Patient 50 - 74% Assistive device: Picariello-rolling Max distance: 75   Walk 10 feet activity   Assist     Assist level: Moderate Assistance - Patient - 50 - 74% Assistive device: Ledvina-rolling   Walk 50 feet activity   Assist Walk 50 feet with 2 turns activity did not occur: Safety/medical concerns  Assist level:  Moderate Assistance - Patient - 50 - 74% Assistive device: Hoskin-rolling    Walk 150 feet activity   Assist Walk 150 feet activity did not occur: Safety/medical concerns         Walk 10 feet on uneven surface  activity   Assist Walk 10 feet on uneven surfaces activity did not occur: Safety/medical concerns          Wheelchair     Assist Will patient use wheelchair at discharge?: Yes (Per PT long term goals) Type of Wheelchair: Manual    Wheelchair assist level: Minimal Assistance - Patient > 75% Max wheelchair distance: 150    Wheelchair 50 feet with 2 turns activity    Assist        Assist Level: Minimal Assistance - Patient > 75%   Wheelchair 150 feet activity     Assist      Assist Level: Minimal Assistance - Patient > 75%   Blood pressure (!) 141/67, pulse 60, temperature 98.1 F (36.7 C), resp. rate 20, SpO2 97 %.  Medical Problem List and Plan: 1.  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  Antithrombotics: -DVT/anticoagulation:               -antiplatelet therapy: Aspirin 81 mg daily and Plavix 75 mg daily x3 weeks followed by Plavix alone 3. Pain Management: Tylenol as needed. Well controlled 4. Mood: Provide emotional support             -antipsychotic agents: N/A 5. Neuropsych: This patient is capable of making decisions on his own behalf. 6. Skin/Wound Care: Routine skin checks. 7. Fluids/Electrolytes/Nutrition: Routine in and outs. 8.  Hypertension. Tenormin 50 mg daily, Cozaar 25 mg daily, increase to 50 Vitals:   02/28/20 1926 02/29/20 0505  BP: 130/66 (!) 141/67  Pulse: (!) 56 60  Resp:    Temp: 98.5 F (36.9 C) 98.1 F (36.7 C)  SpO2: 97% 97%   Relatively controlled on 10/24  9.  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  BPH with TURP.    Latest PVRs without retention 11.    Hyperlipidemia: Lipitor 12.  AKI  Creatinine 1.30 on 10/18, labs ordered for tomorrow  Encourage fluids  Continue to monitor 13.  Hx GERD, used carafate at home may resume if having daily sx  Improved  LOS: 9 days A FACE TO FACE EVALUATION WAS PERFORMED  Hector [name] [name] 02/29/2020,  8:45 AM

## 2020-02-29 NOTE — Progress Notes (Addendum)
Physical Therapy Session Note  Patient Details  Name: Hector [name] MRN: [id number] Date of Birth: 07/12/1940  Today's Date: 02/29/2020 PT Individual Time: 1020-1105; 1550-1630 PT Individual Time Calculation (min): 45 min , 40 min  Short Term Goals:  Week 2:  PT Short Term Goal 1 (Week 2): Pt will perform sit<>stands using LRAD with CGA PT Short Term Goal 2 (Week 2): Pt will perform stand pivot transfers using LRAD with CGA PT Short Term Goal 3 (Week 2): Pt will ambulate at least 178ft using LRAD with min assist PT Short Term Goal 4 (Week 2): Pt will ascend/descend 4 steps using HRs with min assist     Skilled Therapeutic Interventions/Progress Updates:  tx 1:  Pt resting in bed.  He denied pain.  He was dissatisfied because he did not have a schedule for the weekend, and thought he did not have tx today.  PT wrote down his schedule and he was satisfied with this.  Pt donned pants in supine with min assist.  Supine> sit to L with supervision, slowly.  PT donned TEDS; pt donned shoes with set up.  Sit> stand with close supervision.    neuromuscular re-education via multimodal cues for bil mini squats with bil UE support, x 5. Pt continually moved his R foot causing him to lose his balance.  Pt had L lateral lean in standing; exacerbated by scoliosis.  He was unable to state if he typically leaned L.  Min assist for balance as he sat back on bed.  Seated in w/c, using visual feedback- 10 x 1each:  isolated R hip flexion, R long arc knee extension.   Squat pivot bed> wc to R with close supervision.  Pt brushed teeth and washed face from wc level with set up.  Gait training with ACE RLE for foot drop, using weighted grocery cart for AD, x 150' iwht min assist.  As pt fatigued, R toes caught on floor.  Pt aware but unable to control.  PT cued pt for slower gait with smaller steps for safety.  Up/down 4 steps L rail, CGA with cues for step through pattern when ascending, and step to method  for descending due to poor eccentric motor control R knee.  At end of session, pt seated in wc with seat pad alarm set and needs at hand.  tx 2:  Pt resting in bed.  He denied pain.  neuromuscular re-education via multimodal cues and demo for supine: bil bridging with bil adductor squeezes, cervical flexion, R scapular protraction with assistance from PT for elbow extension.  R/L side lying for 10 x 1 L/R hip abduction with flexed hips and knees.  Pt demonstrated poor recruitment R hip abductors.   Bed mobility in flat bed wihtout use of rails for R/L sidelying, supine> sit iwht supervision. In standing with LUE support on wall rail, pt kicked soccer ball with R foot, focusing on knee flexion to prepare to kick, and ankle DF for "lift" of the ball up in the air as he kicked it.  Sit > stand from raised bed to RW, CGA.  Gait training with RW over level tile, (no ACE RLE) x 100' with min/mod assist due to R knee instability and R foot drop.    At end of session, pt resting in w/c with seat pad alarm set and needs at hand.     Therapy Documentation Precautions:  Precautions Precautions: Fall Precaution Comments: R-sided weakness Restrictions Weight Bearing Restrictions: No Pain: Pain  Assessment Pain Scale: 0-10 Pain Score: 0-No pain      Therapy/Group: Individual Therapy  Marlia Schewe 02/29/2020, 12:21 PM

## 2020-02-29 NOTE — Plan of Care (Signed)
°  Problem: Consults Goal: RH STROKE PATIENT EDUCATION Description: See Patient Education module for education specifics  Outcome: Progressing   Problem: RH BOWEL ELIMINATION Goal: RH STG MANAGE BOWEL W/MEDICATION W/ASSISTANCE Description: STG Manage Bowel with Medication with mod I Assistance. Outcome: Progressing   Problem: RH BLADDER ELIMINATION Goal: RH STG MANAGE BLADDER WITH EQUIPMENT WITH ASSISTANCE Description: STG Manage Bladder With Equipment With mod I Assistance Outcome: Progressing   Problem: RH SKIN INTEGRITY Goal: RH STG MAINTAIN SKIN INTEGRITY WITH ASSISTANCE Description: STG Maintain Skin Integrity With mod I Assistance. Outcome: Progressing   Problem: RH SAFETY Goal: RH STG ADHERE TO SAFETY PRECAUTIONS W/ASSISTANCE/DEVICE Description: STG Adhere to Safety Precautions With cues and reminders. Outcome: Progressing Goal: RH STG DECREASED RISK OF FALL WITH ASSISTANCE Description: STG Decreased Risk of Fall With cues and reminders Outcome: Progressing   Problem: RH COGNITION-NURSING Goal: RH STG USES MEMORY AIDS/STRATEGIES W/ASSIST TO PROBLEM SOLVE Description: STG Uses Memory Aids/Strategies With reminder cues and reminders Assistance to Problem Solve. Outcome: Progressing Goal: RH STG ANTICIPATES NEEDS/CALLS FOR ASSIST W/ASSIST/CUES Description: STG Anticipates Needs/Calls for Assist With cues and reminders Outcome: Progressing   Problem: RH PAIN MANAGEMENT Goal: RH STG PAIN MANAGED AT OR BELOW PT'S PAIN GOAL Description: Pain level less than 4 on scale of 0-10 Outcome: Progressing   Problem: RH KNOWLEDGE DEFICIT Goal: RH STG INCREASE KNOWLEDGE OF HYPERTENSION Description: Pt will be able to demonstrate understanding of medication regimen, dietary and lifestyle modifications to better control hypertension and prevent stroke with mod I assist using handouts and booklet provided.  Outcome: Progressing Goal: RH STG INCREASE KNOWLEDGE OF STROKE  PROPHYLAXIS Description: Pt will be able to demonstrate understanding of medication regimen, dietary and lifestyle modifications to better control hypertension and prevent stroke with mod I assist using handouts and booklet provided.  Outcome: Progressing

## 2020-03-01 ENCOUNTER — Inpatient Hospital Stay (HOSPITAL_COMMUNITY): Payer: Medicare PPO

## 2020-03-01 ENCOUNTER — Inpatient Hospital Stay (HOSPITAL_COMMUNITY): Payer: Medicare PPO | Admitting: Occupational Therapy

## 2020-03-01 ENCOUNTER — Encounter (HOSPITAL_COMMUNITY): Payer: Medicare PPO | Admitting: Psychology

## 2020-03-01 LAB — BASIC METABOLIC PANEL
Anion gap: 8 (ref 5–15)
BUN: 14 mg/dL (ref 8–23)
CO2: 24 mmol/L (ref 22–32)
Calcium: 9.3 mg/dL (ref 8.9–10.3)
Chloride: 104 mmol/L (ref 98–111)
Creatinine, Ser: 1.12 mg/dL (ref 0.61–1.24)
GFR, Estimated: >60 mL/min (ref >60)
Glucose, Bld: 133 mg/dL — ABNORMAL HIGH (ref 70–99)
Potassium: 4 mmol/L (ref 3.5–5.1)
Sodium: 136 mmol/L (ref 135–145)

## 2020-03-01 NOTE — Consult Note (Signed)
Neuropsychological Consultation   Patient:   Hector [name]   DOB:   08/28/1940  MR Number:  [id number]  Location:  [facility] [place] [id number] [place] [address] Dept: [place]: [phone]           Date of Service:   03/01/2020  Start Time:   9 AM End Time:   10 AM  Provider/Observer:  Ilean Skill, Psy.D.       Clinical Neuropsychologist       Billing Code/Service: 69485  Chief Complaint:    Hector [name] is a 79 year old male with a history of hypertension, atypical carcinoid tumor, BPH, and remote smoker.  Patient has recently been through a number of surgical interventions for his lung tumor and after attempts to resolve it with laser surgery he had one of his lobes removed with good prognosis given to him by his physician.  Patient presented on 02/16/2020 with right-sided weakness and dysarthria.  Cranial CT scan showed few scattered hypoattenuated foci in the basal ganglia and left external capsule.  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  Patient was admitted to the comprehensive rehabilitation program for follow-up and recovery from his recent CVA.  Reason for Service:  Patient was referred for neuropsychological consultation due to coping and adjustment issues.  The patient has been through a lot medically recently with his treatment for his lung tumor at [facility] and once that had stabilized he had this CVA.  The patient lives in [place] and is up here staying with his daughter while his wife was having surgery and recovering from the surgery.  Below see HPI for the current admission.  HPI: Hector [name] is a 79 year old right-handed male with history of hypertension, atypical carcinoid tumor, BPH with TURP remote smoker.  He lives with spouse in [place] has been staying in the area with daughter after wife recent hysterectomy.  Independent  prior to admission.  1 level home 2-3 steps to entry.  Presented 02/16/2020 with right side weakness and dysarthria.  Cranial CT scan showed few scattered hypoattenuating foci in the basal ganglia and left external capsule.  CT angiogram of head and neck no emergent large vessel occlusion.  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  No hemorrhage or mass-effect.  Echocardiogram with ejection fraction of 60 to 65% no wall motion abnormalities.  Admission chemistries hemoglobin 17.5, glucose 152, urinalysis negative nitrite, hemoglobin A1c 6.2, TSH 1.44.  Maintained on aspirin and Plavix x3 weeks for CVA prophylaxis followed by Plavix alone.  Subcutaneous   Tolerating a regular diet.  Therapy evaluations completed and patient was admitted for a comprehensive rehab program.  Current Status:  Upon entering the room, the patient was sitting in his wheelchair alert and oriented.  There did not appear to be any cognitive deficits going on related to memory, attention concentration, executive functioning etc.  The patient does have residual motor deficits on his right side directly related to the recent infarct of the left paramedian pons.  Patient is having some significant coping issues.  He had already gone through 3 surgical interventions for his lung tumor and while he had potentially gone through these processes and was on the road to recovery and return back to life he had this stroke event.  The patient had crying spells today but they were very appropriate in connection with what he had been  going through both prior to the stroke and since the stroke in his worries and fears about functioning going forward.  We will need to keep an eye on it and instructed the patient on what to watch out for to make sure there does not develop a major depressive event but at this point I do not think the patient is dealing with a clinical depression but an appropriate emotional response to significant medical  and life events.  Behavioral Observation: Hector [name]  presents as a 79 y.o.-year-old Right Caucasian Male who appeared his stated age. his dress was Appropriate and he was Well Groomed and his manners were Appropriate to the situation.  his participation was indicative of Appropriate and Attentive behaviors.  There were  physical disabilities noted.  he displayed an appropriate level of cooperation and motivation.     Interactions:    Active Appropriate and Attentive  Attention:   within normal limits and attention span and concentration were age appropriate  Memory:   within normal limits; recent and remote memory intact  Visuo-spatial:  not examined  Speech (Volume):  normal  Speech:   normal; normal  Thought Process:  Coherent and Relevant  Though Content:  WNL; not suicidal and not homicidal  Orientation:   person, place, time/date and situation  Judgment:   Good  Planning:   Good  Affect:    Appropriate  Mood:    Dysphoric  Insight:   Good  Intelligence:   normal  Medical History:   Past Medical History:  Diagnosis Date  . Diverticulitis 04/2012   admission [facility]  . Former smoker    Smoked for 20 yrs;  Quit  25 yrs ago.  Marland Kitchen GERD (gastroesophageal reflux disease)   . Hypertension   . Hypokalemia   . Lung cancer ([place])   . 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 Polycythemia   . Prostatitis    S/P  TURP        Abuse/Trauma History: The patient had recently gone through surgical interventions for a atypical cancerous tumor in his lung.  Psychiatric History:  No prior psychiatric history.  Family Med/Psych History:  Family History  Problem Relation Age of Onset  . Stroke Mother 35  . Heart disease Father 58  . Cancer Paternal Uncle        Carcinoma of unknown origin.  . Colon cancer Neg Hx      Impression/DX:  Hector [name] is a 79 year old male with a history of hypertension, atypical carcinoid tumor, BPH, and remote smoker.   Patient has recently been through a number of surgical interventions for his lung tumor and after attempts to resolve it with laser surgery he had one of his lobes removed with good prognosis given to him by his physician.  Patient presented on 02/16/2020 with right-sided weakness and dysarthria.  Cranial CT scan showed few scattered hypoattenuated foci in the basal ganglia and left external capsule.  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  Patient was admitted to the comprehensive rehabilitation program for follow-up and recovery from his recent CVA.  There did not appear to be any cognitive deficits going on related to memory, attention concentration, executive functioning etc.  The patient does have residual motor deficits on his right side directly related to the recent infarct of the left paramedian pons.  Patient is having some significant coping issues.  He had already gone through 3 surgical interventions for his lung tumor  and while he had potentially gone through these processes and was on the road to recovery and return back to life he had this stroke event.  The patient had crying spells today but they were very appropriate in connection with what he had been going through both prior to the stroke and since the stroke in his worries and fears about functioning going forward.  We will need to keep an eye on it and instructed the patient on what to watch out for to make sure there does not develop a major depressive event but at this point I do not think the patient is dealing with a clinical depression but an appropriate emotional response to significant medical and life events.   Disposition/Plan: Today we worked on coping and adjustment issues with residual effects of his recent left pons CVA.  The patient is making progressive improvements but is having some significant negative emotional response to loss of function although he is making significant gains.  The patient also  recently gone through a significant medical event after being diagnosed with a lung tumor and had 2 laser surgeries followed up by lobectomy.  I have will follow up with the patient first of next week.    Diagnosis:    Left pontine cerebrovascular accident [facility]) - Plan: Ambulatory referral to Neurology         Electronically Signed   _______________________ Ilean Skill, Psy.D.

## 2020-03-01 NOTE — Progress Notes (Signed)
[place] PHYSICAL MEDICINE & REHABILITATION PROGRESS NOTE   Subjective/Complaints:  Was happy about getting a lot of therapy over the weekend  Worried about level of function at discharge   Review of systems: Denies CP, SOB, N/V/D  Objective:   No results found. No results for input(s): WBC, HGB, HCT, PLT in the last 72 hours. Recent Labs    03/01/20 0625  NA 136  K 4.0  CL 104  CO2 24  GLUCOSE 133*  BUN 14  CREATININE 1.12  CALCIUM 9.3    Intake/Output Summary (Last 24 hours) at 03/01/2020 0858 Last data filed at 03/01/2020 0842 Gross per 24 hour  Intake 1074 ml  Output 1240 ml  Net -166 ml        Physical Exam: Vital Signs Blood pressure (!) 146/76, pulse 62, temperature 98.5 F (36.9 C), temperature source Oral, resp. rate 18, SpO2 96 %.  General: No acute distress Mood and affect are appropriate Heart: Regular rate and rhythm no rubs murmurs or extra sounds Lungs: Clear to auscultation, breathing unlabored, no rales or wheezes Abdomen: Positive bowel sounds, soft nontender to palpation, nondistended Extremities: No clubbing, cyanosis, or edema Skin: No evidence of breakdown, no evidence of rash  Neuro: Alert Motor: LUE/LE: 5/5 proximal distal, stable RUE: 2+/5 proximal distal RLE: Hip flexion, knee extension 3/5, ankle dorsiflexion 2/5, improving  Assessment/Plan: 1. 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  Physiatrist is providing close team supervision and 24 hour management of active medical problems listed below.  Physiatrist and rehab team continue to assess barriers to discharge/monitor patient progress toward functional and medical goals  Care Tool:  Bathing  Bathing activity did not occur: Refused (Declined on this date, however agreeable to bathing at next OT session) Body parts bathed by patient: Right arm,  Left arm, Chest, Abdomen, Front perineal area, Buttocks, Right upper leg, Left upper leg, Left lower leg, Right lower leg, Face   Body parts bathed by helper: Left arm, Buttocks Body parts n/a: Left lower leg, Right lower leg, Left upper leg, Right upper leg, Abdomen, Chest, Left arm, Right arm   Bathing assist Assist Level: Minimal Assistance - Patient > 75%     Upper Body Dressing/Undressing Upper body dressing   What is the patient wearing?: Pull over shirt    Upper body assist Assist Level: Set up assist    Lower Body Dressing/Undressing Lower body dressing      What is the patient wearing?: Pants, Underwear/pull up     Lower body assist Assist for lower body dressing: Minimal Assistance - Patient > 75%     Toileting Toileting Toileting Activity did not occur (Clothing management and hygiene only): N/A (no void or bm)  Toileting assist Assist for toileting: Minimal Assistance - Patient > 75% Assistive Device Comment:  (urinal/stedy)   Transfers Chair/bed transfer  Transfers assist     Chair/bed transfer assist level: Supervision/Verbal cueing Chair/bed transfer assistive device: Armrests   Locomotion Ambulation   Ambulation assist      Assist level: Minimal Assistance - Patient > 75% Assistive device: Orthosis (weighted grocery cart) Max distance: 150   Walk 10 feet activity   Assist     Assist level: Minimal Assistance - Patient > 75% Assistive device: Other (comment), Orthosis (weighted grocery cart)   Walk 50 feet activity   Assist Walk 50 feet with 2 turns activity did not occur:  Safety/medical concerns  Assist level: Minimal Assistance - Patient > 75% Assistive device: Other (comment), Orthosis (weighted grocery cart)    Walk 150 feet activity   Assist Walk 150 feet activity did not occur: Safety/medical concerns  Assist level: Minimal Assistance - Patient > 75% Assistive device: Orthosis, Other (comment) (weighted grocery cart)     Walk 10 feet on uneven surface  activity   Assist Walk 10 feet on uneven surfaces activity did not occur: Safety/medical concerns         Wheelchair     Assist Will patient use wheelchair at discharge?: Yes (Per PT long term goals) Type of Wheelchair: Manual    Wheelchair assist level: Minimal Assistance - Patient > 75% Max wheelchair distance: 150    Wheelchair 50 feet with 2 turns activity    Assist        Assist Level: Minimal Assistance - Patient > 75%   Wheelchair 150 feet activity     Assist      Assist Level: Minimal Assistance - Patient > 75%   Blood pressure (!) 146/76, pulse 62, temperature 98.5 F (36.9 C), temperature source Oral, resp. rate 18, SpO2 96 %.  Medical Problem List and Plan: 1.  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  Antithrombotics: -DVT/anticoagulation:               -antiplatelet therapy: Aspirin 81 mg daily and Plavix 75 mg daily x3 weeks followed by Plavix alone 3. Pain Management: Tylenol as needed. Well controlled 4. Mood: Provide emotional support             -antipsychotic agents: N/A 5. Neuropsych: This patient is capable of making decisions on his own behalf. 6. Skin/Wound Care: Routine skin checks. 7. Fluids/Electrolytes/Nutrition: Routine in and outs. 8.  Hypertension. Tenormin 50 mg daily, Cozaar 25 mg daily, increase to 50 Vitals:   03/01/20 0758 03/01/20 0801  BP: (!) 146/76 (!) 146/76  Pulse:  62  Resp:    Temp:    SpO2:     Relatively controlled on 10/25  9.  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  BPH with TURP.    Latest PVRs without retention 11.    Hyperlipidemia: Lipitor 12.  AKI  Resolved 10/25 13.  Hx GERD, used carafate at home may resume if having daily sx  Improved  LOS: 10 days A FACE TO  [place] E Kashlynn Kundert 03/01/2020, 8:58 AM

## 2020-03-01 NOTE — Progress Notes (Signed)
Occupational Therapy Session Note  Patient Details  Name: Hector [name] MRN: [id number] Date of Birth: January 30, 1941  Today's Date: 03/01/2020 OT Individual Time: 4827-0786 OT Individual Time Calculation (min): 47 min    Short Term Goals: Week 2:  OT Short Term Goal 1 (Week 2): Pt will complete LB bathing sit to stand with min guard assist. OT Short Term Goal 2 (Week 2): Pt will complete LB dressing min min guard assist sit to stand. OT Short Term Goal 3 (Week 2): Pt will complete toilet transfers with min guard assist using the RW for support. OT Short Term Goal 4 (Week 2): Pt will use the RUE as an active assist during grooming and bathing tasks with supervision.  Skilled Therapeutic Interventions/Progress Updates:    patient seated in w/c, alert and ready for therapy session.  He notes no pain at this time.  Sit to stand and ambulation with RW to/from w/c and toilet with min A.  toileting mod A for clothing management.  Hand hygiene in stance with CGA.  SPT to/from mat table with min A.  Completed seated and standing NMRE activities with focus on proximal control/stability, seated/standing balance, weight shift, weight bearing, facilitation of proximal musculature and inhibition of flexors.  Provided theraputty and reviewed right hand/digit extension activities.  Good tolerance and active participation.  SPT back to bed at close of session with min A.  Sit to supine with CS, min cues.  Bed alarm set and call bell in hand.    Therapy Documentation Precautions:  Precautions Precautions: Fall Precaution Comments: R-sided weakness Restrictions Weight Bearing Restrictions: No  Therapy/Group: Individual Therapy  Carlos Levering 03/01/2020, 7:38 AM

## 2020-03-01 NOTE — Progress Notes (Signed)
Physical Therapy Session Note  Patient Details  Name: Hector [name] MRN: [id number] Date of Birth: 1940/11/28  Today's Date: 03/01/2020 PT Individual Time: 1132-1205 PT Individual Time Calculation (min): 33 min   Short Term Goals: Week 2:  PT Short Term Goal 1 (Week 2): Pt will perform sit<>stands using LRAD with CGA PT Short Term Goal 2 (Week 2): Pt will perform stand pivot transfers using LRAD with CGA PT Short Term Goal 3 (Week 2): Pt will ambulate at least 163ft using LRAD with min assist PT Short Term Goal 4 (Week 2): Pt will ascend/descend 4 steps using HRs with min assist  Skilled Therapeutic Interventions/Progress Updates:    Patient received sitting up in wc, agreeable to PT. He denies pain. PT propelling patient in wc to therapy gym for time management. He was able to negotiate stairs x4 with B HR use and CGA, ascending in step to pattern leading with R LE. Patient then able to negotiate stairs x4 with L HR use and ascending in step to pattern leading with L LE. He notes no hand rails to front entrance of house. He was able to ascend 1 step x4 with no HR use and MinA leading with L LE. Patient trailing SBQC x34ft and x20ft using step-to pattern and CGA. No LOB and no increase in R knee buckling. Slower gait speed noted however. Patient returning to room in wc, chair alarm on, call light within reach.   Therapy Documentation Precautions:  Precautions Precautions: Fall Precaution Comments: R-sided weakness Restrictions Weight Bearing Restrictions: No    Therapy/Group: Individual Therapy  Karoline Caldwell, PT, DPT, CBIS 03/01/2020, 7:35 AM

## 2020-03-01 NOTE — Progress Notes (Signed)
Physical Therapy Session Note  Patient Details  Name: ALDAN CAMEY MRN: [id number] Date of Birth: February 12, 1941  Today's Date: 03/01/2020 PT Individual Time: 1515-1600 PT Individual Time Calculation (min): 45 min   Short Term Goals: Week 2:  PT Short Term Goal 1 (Week 2): Pt will perform sit<>stands using LRAD with CGA PT Short Term Goal 2 (Week 2): Pt will perform stand pivot transfers using LRAD with CGA PT Short Term Goal 3 (Week 2): Pt will ambulate at least 165ft using LRAD with min assist PT Short Term Goal 4 (Week 2): Pt will ascend/descend 4 steps using HRs with min assist  Skilled Therapeutic Interventions/Progress Updates:    Patient in supine requesting to toilet.  Patient supine to sit with supervision after min A to don shoes and ambulated to bathroom with RW and hand splint min A for toileting and washed hands with close S for balance. Assisted in w/c to ortho gym.  Practice on unlevel surface with ramp for ambulation x 20' x 2 with RW and wrapped L foot for DF assist for clearance, with knee buckling x 1 and min A recovery.  Patient ambulated through obstacle course stepping over around and on items with RW and CGA.  Patient performed standing balance on low foam beam static x 1 minute cues for even weight shift, then dynamic stepping in and out alternating feet then stepping on and off with mirror for feedback with foot positioning.  Patient assisted in w/c to room and left with chair alarm activated and needs in reach.   Therapy Documentation Precautions:  Precautions Precautions: Fall Precaution Comments: R-sided weakness Restrictions Weight Bearing Restrictions: No Pain: Pain Assessment Pain Score: 0-No pain    Therapy/Group: Individual Therapy  Reginia Naas  Magda Kiel, PT 03/01/2020, 3:46 PM

## 2020-03-01 NOTE — Progress Notes (Signed)
Physical Therapy Session Note  Patient Details  Name: Hector [name] MRN: [id number] Date of Birth: [birth year]  Today's Date: 03/01/2020 PT Individual Time: 0800-0857 PT Individual Time Calculation (min): 57 min   Short Term Goals: Week 2:  PT Short Term Goal 1 (Week 2): Pt will perform sit<>stands using LRAD with CGA PT Short Term Goal 2 (Week 2): Pt will perform stand pivot transfers using LRAD with CGA PT Short Term Goal 3 (Week 2): Pt will ambulate at least 115ft using LRAD with min assist PT Short Term Goal 4 (Week 2): Pt will ascend/descend 4 steps using HRs with min assist  Skilled Therapeutic Interventions/Progress Updates:    Patient received supine in bed, agreeable to PT. He denies pain. He voices excitement at daily improvements he has been noticing in R LE/UE. Patient able to don pants supine in bed with MinA for R LE. PT donned TED hose. Supervision for supine > sitting edge of bed. ModA to don shoes in sitting. CGA for stand pivot to wc. He demonstrates greater R knee control during transfer. PT propelling patient in wc to therapy gym for time management and energy conservation. In sitting, patient able to achieve R heel raise, which is an improvement from Friday, indicating greater return of R gastroc/soleus. Patient ambulating that following distances on treadmill with LiteGait for fall precautions (no bodyweight support): 4' at 1.14mph for 367ft, 6'30" at 1.57mph for 567ft. Patient with greater ability to achieve R toe clearance during swing phase and improved R knee stability in stance. Patient ambulating back to room (~138ft) with RW, modified hand grip and CGA-MinA with further distances. Patient requires verbal cuing for exaggerating R toe clearance and decreased speed to ensure accurate R foot placement. Patient returning to wc in room, chair alarm on, call light within reach.   Therapy Documentation Precautions:  Precautions Precautions: Fall Precaution Comments: R-sided  weakness Restrictions Weight Bearing Restrictions: No   Therapy/Group: Individual Therapy  Karoline Caldwell, PT, DPT, CBIS 03/01/2020, 7:39 AM

## 2020-03-02 ENCOUNTER — Inpatient Hospital Stay (HOSPITAL_COMMUNITY): Payer: Medicare PPO | Admitting: Occupational Therapy

## 2020-03-02 ENCOUNTER — Inpatient Hospital Stay (HOSPITAL_COMMUNITY): Payer: Medicare PPO | Admitting: Physical Therapy

## 2020-03-02 NOTE — Progress Notes (Signed)
Physical Therapy Session Note  Patient Details  Name: Hector [name] MRN: [id number] Date of Birth: 06/14/1940  Today's Date: 03/02/2020 PT Individual Time: 0800-0900 PT Individual Time Calculation (min): 60 min   Short Term Goals: Week 1:  PT Short Term Goal 1 (Week 1): Pt will transfer to and from [facility] with min assist PT Short Term Goal 1 - Progress (Week 1): Met PT Short Term Goal 2 (Week 1): Pt will ambulate 61ft with mod assist and LRAD PT Short Term Goal 2 - Progress (Week 1): Met PT Short Term Goal 3 (Week 1): Pt will Propell WC with supervision assist x 163ft PT Short Term Goal 3 - Progress (Week 1): Discontinued (comment) (have not focused on w/c mobility due to pt progress with ambulation) PT Short Term Goal 4 (Week 1): Pt will consistenly perform bed mobility with CGA PT Short Term Goal 4 - Progress (Week 1): Met Week 2:  PT Short Term Goal 1 (Week 2): Pt will perform sit<>stands using LRAD with CGA PT Short Term Goal 2 (Week 2): Pt will perform stand pivot transfers using LRAD with CGA PT Short Term Goal 3 (Week 2): Pt will ambulate at least 11ft using LRAD with min assist PT Short Term Goal 4 (Week 2): Pt will ascend/descend 4 steps using HRs with min assist  Skilled Therapeutic Interventions/Progress Updates:    pt received in bed and agreeable to therapy. Pt requested to get dressed prior to therapy and directed in supine>sit CGA; sat EOB static SBA and CGA for dynamic shirt change; pt directed in x2 Sit to stand from EOB no AD, CGA for donning/doffing shorts and pants. Pt directed in Stand pivot transfer with Rolling Cottier at [place]. Pt taken to gym in [facility] for time and energy conservation. Pt directed in step ups on 4" step up with Rolling Patient for stability at [facility] for RLE to step onto platform and min A for LLE to step onto platform, 2x10 each for improved coordination and stability. Pt directed in gait training with Rolling Kattner for 150' varied from min A-mod A for stability  and stepping pattern on RLE with x3 LOB total with mod A to correct 2/2 R knee buckling. Pt also benefited from [facility] for stopping gait, correcting balance and posture in standing at [place] prior to starting walking again and instead of continuing to walk with poor foot position, trunk alignment, and R knee position. Pt directed in 2x20 underhand bag toss to target ~6' away with direction to retrieve bean bag with R hand within limits of stability and hand to L hand for throw, min A-CGA overall; increased knee flexion on RLE with fatigue and required increased VC for corrections. Pt directed in gait with quad cane with greatly improved gait pattern for 50' min A-CGA with much fewer instances of LOB, R knee buckling and improved trunk extension instead of Rolling Polansky use; however pt ambulated shorter distance and more step to pattern instead of step throughout, decreased cadence. Pt returned to [facility], Pt taken to room, dependent for time management and energy conservation. Pt left in WC, alarm set, All needs in reach and in good condition. Call light in hand.    Therapy Documentation Precautions:  Precautions Precautions: Fall Precaution Comments: R-sided weakness Restrictions Weight Bearing Restrictions: No General:   Vital Signs: Therapy Vitals Temp: 98.1 F (36.7 C) Pulse Rate: 63 Resp: 16 BP: (!) 149/79 Patient Position (if appropriate): Lying Oxygen Therapy SpO2: 99 % O2 Device: Room Air  Pain: Pain Assessment Pain Scale: 0-10 Pain Score: 0-No pain    Therapy/Group: Individual Therapy  Junie Panning 03/02/2020, 9:17 AM

## 2020-03-02 NOTE — Progress Notes (Signed)
[place] PHYSICAL MEDICINE & REHABILITATION PROGRESS NOTE   Subjective/Complaints:    Review of systems: Denies CP, SOB, N/V/D  Objective:   No results found. No results for input(s): WBC, HGB, HCT, PLT in the last 72 hours. Recent Labs    03/01/20 0625  NA 136  K 4.0  CL 104  CO2 24  GLUCOSE 133*  BUN 14  CREATININE 1.12  CALCIUM 9.3    Intake/Output Summary (Last 24 hours) at 03/02/2020 0844 Last data filed at 03/02/2020 0531 Gross per 24 hour  Intake 354 ml  Output 725 ml  Net -371 ml        Physical Exam: Vital Signs Blood pressure (!) 149/79, pulse 63, temperature 98.1 F (36.7 C), resp. rate 16, SpO2 99 %.  General: No acute distress Mood and affect are appropriate Heart: Regular rate and rhythm no rubs murmurs or extra sounds Lungs: Clear to auscultation, breathing unlabored, no rales or wheezes Abdomen: Positive bowel sounds, soft nontender to palpation, nondistended Extremities: No clubbing, cyanosis, or edema Skin: No evidence of breakdown, no evidence of rash  Neuro: Alert Motor: LUE/LE: 5/5 proximal distal, stable RUE: 2+/5 proximal distal RLE: Hip flexion, knee extension 3/5, ankle dorsiflexion 2/5, improving  Assessment/Plan: 1. 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  Physiatrist is providing close team supervision and 24 hour management of active medical problems listed below.  Physiatrist and rehab team continue to assess barriers to discharge/monitor patient progress toward functional and medical goals  Care Tool:  Bathing  Bathing activity did not occur: Refused (Declined on this date, however agreeable to bathing at next OT session) Body parts bathed by patient: Right arm, Left arm, Chest, Abdomen, Front perineal area, Buttocks, Right upper leg, Left upper leg, Left lower leg, Right lower leg, Face    Body parts bathed by helper: Left arm, Buttocks Body parts n/a: Left lower leg, Right lower leg, Left upper leg, Right upper leg, Abdomen, Chest, Left arm, Right arm   Bathing assist Assist Level: Minimal Assistance - Patient > 75%     Upper Body Dressing/Undressing Upper body dressing   What is the patient wearing?: Pull over shirt    Upper body assist Assist Level: Set up assist    Lower Body Dressing/Undressing Lower body dressing      What is the patient wearing?: Pants, Underwear/pull up     Lower body assist Assist for lower body dressing: Minimal Assistance - Patient > 75%     Toileting Toileting Toileting Activity did not occur (Clothing management and hygiene only): N/A (no void or bm)  Toileting assist Assist for toileting: Minimal Assistance - Patient > 75% Assistive Device Comment:  (urinal/stedy)   Transfers Chair/bed transfer  Transfers assist     Chair/bed transfer assist level: Contact Guard/Touching assist Chair/bed transfer assistive device: Armrests   Locomotion Ambulation   Ambulation assist      Assist level: Contact Guard/Touching assist Assistive device: Salemi-rolling Max distance: 75'   Walk 10 feet activity   Assist     Assist level: Contact Guard/Touching assist Assistive device: Maiers-rolling   Walk 50 feet activity   Assist Walk 50 feet with 2 turns activity did not occur: Safety/medical concerns  Assist level: Contact Guard/Touching assist Assistive device: Lueth-rolling    Walk 150 feet activity   Assist Walk 150 feet activity did not occur: Safety/medical concerns  Assist level: Minimal Assistance -  Patient > 75% Assistive device: [place]-rolling    Walk 10 feet on uneven surface  activity   Assist Walk 10 feet on uneven surfaces activity did not occur: Safety/medical concerns         Wheelchair     Assist Will patient use wheelchair at discharge?: Yes (Per PT long term goals) Type of  Wheelchair: Manual    Wheelchair assist level: Minimal Assistance - Patient > 75% Max wheelchair distance: 150    Wheelchair 50 feet with 2 turns activity    Assist        Assist Level: Minimal Assistance - Patient > 75%   Wheelchair 150 feet activity     Assist      Assist Level: Independent   Blood pressure (!) 149/79, pulse 63, temperature 98.1 F (36.7 C), resp. rate 16, SpO2 99 %.  Medical Problem List and Plan: 1.  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  Antithrombotics: -DVT/anticoagulation:               -antiplatelet therapy: Aspirin 81 mg daily and Plavix 75 mg daily x3 weeks followed by Plavix alone 3. Pain Management: Tylenol as needed. Well controlled 4. Mood: Provide emotional support             -antipsychotic agents: N/A 5. Neuropsych: This patient is capable of making decisions on his own behalf. 6. Skin/Wound Care: Routine skin checks. 7. Fluids/Electrolytes/Nutrition: Routine in and outs. 8.  Hypertension. Tenormin 50 mg daily, Cozaar 25 mg daily, increase to 50 Vitals:   03/01/20 2008 03/02/20 0526  BP: 130/73 (!) 149/79  Pulse: 67 63  Resp: 18 16  Temp: 97.9 F (36.6 C) 98.1 F (36.7 C)  SpO2: 97% 99%   Relatively controlled on 10/26  9.  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  BPH with TURP.    Latest PVRs without retention 11.    Hyperlipidemia: Lipitor  12.  Hx GERD, used carafate at home may resume if having daily sx  Improved  LOS: 11 days A FACE TO [place] E Orlyn Odonoghue 03/02/2020, 8:44 AM

## 2020-03-02 NOTE — Progress Notes (Signed)
Occupational Therapy Session Note  Patient Details  Name: Hector [name] MRN: [id number] Date of Birth: 25-Oct-1940  Today's Date: 03/02/2020 OT Individual Time: 1330-1430 OT Individual Time Calculation (min): 60 min    Short Term Goals: Week 2:  OT Short Term Goal 1 (Week 2): Pt will complete LB bathing sit to stand with min guard assist. OT Short Term Goal 2 (Week 2): Pt will complete LB dressing min min guard assist sit to stand. OT Short Term Goal 3 (Week 2): Pt will complete toilet transfers with min guard assist using the RW for support. OT Short Term Goal 4 (Week 2): Pt will use the RUE as an active assist during grooming and bathing tasks with supervision.  Skilled Therapeutic Interventions/Progress Updates:    Patient seated in w/c, ready for therapy session.  He denies pain.  SPT with RW to/from mat table with CGA, min cues.  He completed unsupported sitting posture, trunk mobility, proximal control, reach and FMC/dexterity activities with good tolerance and improved distal function noted today.  Box and blocks:  L = 48, R = 10 Completed dynavision in stance, 1 minute, bottom half of screen: reaction time  L = 1.33 sec, R = 4.0 sec - no LOB (utilizes back of index finger and thumb to hit targets - attempted to use pointer but unable to maintain grasp and have enough force to push buttons) Completed hand dexterity activities - simulated scooping with right hand (red foam provided), hand writing, theraputty program (written exercise provided and reviewed) SPT to toilet at close of session with CGA.  NT aware and to assist upon completion.    Therapy Documentation Precautions:  Precautions Precautions: Fall Precaution Comments: R-sided weakness Restrictions Weight Bearing Restrictions: No  Therapy/Group: Individual Therapy  Carlos Levering 03/02/2020, 7:41 AM

## 2020-03-02 NOTE — Progress Notes (Signed)
Patient ID: Hector [name], male   DOB: 1941-04-17, 79 y.o.   MRN: [id number] Met with the patient to review role of the nurse CM and collaboration with SW to prepare patient for discharge. Reviewed risks for secondary stroke and medications prescribed for this patient. Discussed prediabetes and elevated triglyceride level along with DAPT and HTN mgmt. Patient reported he was not keen on statin drugs but willing to take steps necessary to avoid another stroke. Reported working with PCP on prediabetes and had recent change in medication for HTN. Given handouts on DASH diet, prediabetes eating plan, ASA and your heart. Patient appears to have a good understanding of his current health situation. Continue to follow along to discharge for questions and educational needs. Margarito Liner

## 2020-03-02 NOTE — Progress Notes (Signed)
Physical Therapy Session Note  Patient Details  Name: Hector [name] MRN: [id number] Date of Birth: May 20, 1940  Today's Date: 03/02/2020 PT Individual Time: 8295-6213 PT Individual Time Calculation (min): 71 min   Short Term Goals: Week 2:  PT Short Term Goal 1 (Week 2): Pt will perform sit<>stands using LRAD with CGA PT Short Term Goal 2 (Week 2): Pt will perform stand pivot transfers using LRAD with CGA PT Short Term Goal 3 (Week 2): Pt will ambulate at least 152ft using LRAD with min assist PT Short Term Goal 4 (Week 2): Pt will ascend/descend 4 steps using HRs with min assist  Skilled Therapeutic Interventions/Progress Updates:    Pt received sitting in w/c and agreeable to therapy session. Reports need to use bathroom. Transported in/out bathroom in w/c for time management. Stand pivot to toilet using grab bars as needed with CGA. Standing with UE support on grab bars as needed with close supervision, continent of urine. Seated hand hygiene at sink. Transported to/from gym in w/c for time management and energy conservation. Gait training ~30ft using narrow based QC - pt starting with step-to pattern leading with RLE progressing to reciprocal pattern with cuing -  min/mod assist for balance due to R knee frequently giving way as pt continues to lack full R knee extension during stance phase also has intermittent R toe catching during swing. Repeated same ambulation trial using RW for comparison with pt requiring less assist (min) and decreased knee giving way as pt able to use B UE supuort to recover - also demos improved balance and gait speed with this AD. Will continue to assess for LRAD with continued improvements during gait training.  Stepped on/off treadmill using UE support with min assist for steadying. donned litegait harness. Performed the following locomotor treadmill training trials using B UE support and litegait for safety but no BWS:  Donned R LE ankle DF assist ACE wrap and R  LE swedish knee cage to provide cue for sustained quad activation during stance while preventing hyperextension. - 66min16seconds at 1.2-1.52mph totaling 435ft with cuing for increased quad activation during stance for full knee extension, heel strike on initial contact with knee extended, increased step length, and increased R foot clearance - 57min at 0.8-1.18mph totaling 379ft - decreased speed of ambulation to target improved R LE heel strike with knee extension to initiate quad activation and sustain it through stance phase with therapist providing mod/max manual facilitation for improved gait mechanics Doffed litegait harness and stepped off treadmill as described above.  Discussed trialing toe-up brace with pt planning to ask family to bring in lace up tennis shoes.  Overground gait training ~185ft using RW, still wearing R LE ACE wrap and Swedish knee cage, with min assist for balance - continued cuing as noted on treadmill with pt demoing improved R heel strike and knee extension with sustained quad activation during stance. Transported back to room and pt agreeable to remain seated up in w/c - left with needs in reach and seat belt alarm on.    Therapy Documentation Precautions:  Precautions Precautions: Fall Precaution Comments: R-sided weakness Restrictions Weight Bearing Restrictions: No  Pain: No reports of pain throughout session.   Therapy/Group: Individual Therapy  Tawana Scale , PT, DPT, CSRS  03/02/2020, 8:01 AM

## 2020-03-03 ENCOUNTER — Inpatient Hospital Stay (HOSPITAL_COMMUNITY): Payer: Medicare PPO

## 2020-03-03 ENCOUNTER — Inpatient Hospital Stay (HOSPITAL_COMMUNITY): Payer: Medicare PPO | Admitting: Occupational Therapy

## 2020-03-03 MED ORDER — CLONAZEPAM 0.25 MG PO TBDP: 0.2500 mg | ORAL_TABLET | Freq: Every evening | ORAL | Status: DC | PRN

## 2020-03-03 NOTE — Progress Notes (Signed)
Physical Therapy Session Note  Patient Details  Name: Hector [name] MRN: [id number] Date of Birth: 1940/05/24  Today's Date: 03/03/2020 PT Individual Time: 3790-2409 PT Individual Time Calculation (min): 77 min and 58 mins  Short Term Goals: Week 2:  PT Short Term Goal 1 (Week 2): Pt will perform sit<>stands using LRAD with CGA PT Short Term Goal 2 (Week 2): Pt will perform stand pivot transfers using LRAD with CGA PT Short Term Goal 3 (Week 2): Pt will ambulate at least 122ft using LRAD with min assist PT Short Term Goal 4 (Week 2): Pt will ascend/descend 4 steps using HRs with min assist  Skilled Therapeutic Interventions/Progress Updates:    Session 1 (39mins): Patient received sitting up in recliner, agreeable to PT. He denies pain, but endorses fatigue. Patient ambulating to wc with RW and CGA. No evidence of knee buckling. PT propelling patient in wc to therapy gym for energy conservation. Patient ambulating 156ft x2 with RW and swedish knee cage, CGA. Verbal cuing to engage R quads in stance phase to prevent R knee buckling/ genu recurvatum. Patient then ambulating additional 174ft with RW and no swedish knee cage with good carryover of minimal genu recurvatum. Standing toe taps onto 6" box with B UE support and CGA. 3# ankle weight on R LE. CGA needed for R knee stability in stance. Patient completing toe tapping task to cones without UE support. MinA needed for postural control, especially when standing on R LE. Patient able to ascend/descend 1 step without UE support and CGA x3 B LE. Patient with greater ease ascending with R LE, likely related to poor stance control in R LE when ascending with L LE. Patient returning to room in wc, seatbelt alarm on, call light within reach.   Session 2 (100mins): Patient received supine in bed, agreeable to PT. He denies pain, but endorses fatigue. Patient able to transfer to wc via stand pivot with CGA. PT propelling patient in wc to therapy gym for  time management. He was able to ambulate the following on treadmill with LiteGait for fall precautions (no bodyweight support): 3'30" at 1.33mph for 349ft with Swedish knee cage, 4'30" at 1.24mph for 443ft with Swedish knee cage, 2'30" at 1.33mph for 298ft with dorsiflexion wrap, 3'31" at 1.69mph for 3108ft with dorsiflexion wrap. Patient demonstrating improved R knee control in stance. Genu recurvatum present ~50% of the time, more so with fatigue. Femoral internal rotation present with R knee increased flexion through stance phase. Minimal femoral internal rotation present with appropriate knee control in stance. Patient able to maintain tall kneeling with R UE task. Tall kneeling for improved hip stability. Patient returning to bed, bed alarm on, call light within reach.    Therapy Documentation Precautions:  Precautions Precautions: Fall Precaution Comments: R-sided weakness Restrictions Weight Bearing Restrictions: No    Therapy/Group: Individual Therapy  Karoline Caldwell, PT, DPT, CBIS 03/03/2020, 7:43 AM

## 2020-03-03 NOTE — Progress Notes (Signed)
Occupational Therapy Session Note  Patient Details  Name: Hector [name] MRN: [id number] Date of Birth: [birth year]  Today's Date: 03/03/2020 OT Individual Time: 9509-3267 OT Individual Time Calculation (min): 71 min    Short Term Goals: Week 2:  OT Short Term Goal 1 (Week 2): Pt will complete LB bathing sit to stand with min guard assist. OT Short Term Goal 2 (Week 2): Pt will complete LB dressing min min guard assist sit to stand. OT Short Term Goal 3 (Week 2): Pt will complete toilet transfers with min guard assist using the RW for support. OT Short Term Goal 4 (Week 2): Pt will use the RUE as an active assist during grooming and bathing tasks with supervision.  Skilled Therapeutic Interventions/Progress Updates:    Pt worked on bathing and dressing sit to stand this am with min assist for mobility in the room without use of an assistive device.  Increased LOB with increased knee flexion noted during mobility.  He was able to remove clothing in standing with min assist prior to shower with use of the grab bar for support.  He completed all bathing with min guard assist sit to stand.  Dressing was then completed at the sink at min guard assist as well, except for donning TEDs at max assist from therapist.  He was then able to complete grooming tasks in standing with min guard and mod instructional cueing to maintain right knee extension.  Next, he was taken down to the dayroom where rest of session focused on RUE functional use.  Had him work on picking up 2x3" foam blocks from the mat beside of him and then place in a container in front of him.  He exhibited some difficulty with opening the hand to position around the block as well as for reaching to knee level to place them, incorporating shoulder flexion.  Occasional drops noted.  He was able to return to the wheelchair and then back to the room where he transferred to the recliner with min assist.  Call button and phone in reach with safety  alarm in place.    Therapy Documentation Precautions:  Precautions Precautions: Fall Precaution Comments: R-sided weakness Restrictions Weight Bearing Restrictions: No   Pain: Pain Assessment Pain Scale: Faces Pain Score: 0-No pain ADL: See Care Tool Section for some details of mobility and selfcare  Therapy/Group: Individual Therapy  Diannah Rindfleisch OTR/L 03/03/2020, 10:40 AM

## 2020-03-03 NOTE — Progress Notes (Signed)
Patient ID: Hector [name], male   DOB: 10-Nov-1940, 79 y.o.   MRN: [id number] Team Conference Report to Patient/Family  Team Conference discussion was reviewed with the patient and caregiver, including goals, any changes in plan of care and target discharge date.  Patient and caregiver express understanding and are in agreement.  The patient has a target discharge date of 03/11/20.  Dyanne Iha 03/03/2020, 1:04 PM

## 2020-03-03 NOTE — Progress Notes (Signed)
[facility] PHYSICAL MEDICINE & REHABILITATION PROGRESS NOTE   Subjective/Complaints:  No issues overnight except RIght leg spasm, R foot pulls up Pt takes diazepam at home on prn basis when neuropathy gets bad  Review of systems: Denies CP, SOB, N/V/D  Objective:   No results found. No results for input(s): WBC, HGB, HCT, PLT in the last 72 hours. Recent Labs    03/01/20 0625  NA 136  K 4.0  CL 104  CO2 24  GLUCOSE 133*  BUN 14  CREATININE 1.12  CALCIUM 9.3   No intake or output data in the 24 hours ending 03/03/20 0907      Physical Exam: Vital Signs Blood pressure 128/62, pulse 63, temperature 97.7 F (36.5 C), resp. rate 20, SpO2 97 %.   General: No acute distress Mood and affect are appropriate Heart: Regular rate and rhythm no rubs murmurs or extra sounds Lungs: Clear to auscultation, breathing unlabored, no rales or wheezes Abdomen: Positive bowel sounds, soft nontender to palpation, nondistended Extremities: No clubbing, cyanosis, or edema Skin: No evidence of breakdown, no evidence of rash   Neuro: Alert Motor: LUE/LE: 5/5 proximal distal, stable RUE: 3-/5 proximal distal RLE: Hip flexion, knee extension 4-/5, ankle dorsiflexion 2/5, improving  Assessment/Plan: 1. 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  Physiatrist is providing close team supervision and 24 hour management of active medical problems listed below.  Physiatrist and rehab team continue to assess barriers to discharge/monitor patient progress toward functional and medical goals  Care Tool:  Bathing  Bathing activity did not occur: Refused (Declined on this date, however agreeable to bathing at next OT session) Body parts bathed by patient: Right arm, Left arm, Chest, Abdomen, Front perineal area, Buttocks, Right upper leg, Left upper leg, Left lower leg,  Right lower leg, Face   Body parts bathed by helper: Left arm, Buttocks Body parts n/a: Left lower leg, Right lower leg, Left upper leg, Right upper leg, Abdomen, Chest, Left arm, Right arm   Bathing assist Assist Level: Minimal Assistance - Patient > 75%     Upper Body Dressing/Undressing Upper body dressing   What is the patient wearing?: Pull over shirt    Upper body assist Assist Level: Set up assist    Lower Body Dressing/Undressing Lower body dressing      What is the patient wearing?: Pants, Underwear/pull up     Lower body assist Assist for lower body dressing: Minimal Assistance - Patient > 75%     Toileting Toileting Toileting Activity did not occur (Clothing management and hygiene only): N/A (no void or bm)  Toileting assist Assist for toileting: Minimal Assistance - Patient > 75% Assistive Device Comment:  (urinal/stedy)   Transfers Chair/bed transfer  Transfers assist     Chair/bed transfer assist level: Contact Guard/Touching assist Chair/bed transfer assistive device: Armrests   Locomotion Ambulation   Ambulation assist      Assist level: Minimal Assistance - Patient > 75% Assistive device: Linck-rolling Max distance: 17ft   Walk 10 feet activity   Assist     Assist level: Minimal Assistance - Patient > 75% Assistive device: Segall-rolling   Walk 50 feet activity   Assist Walk 50 feet with 2 turns activity did not occur: Safety/medical concerns  Assist level: Minimal Assistance - Patient > 75% Assistive device: Strack-rolling    Walk 150 feet activity   Assist Walk 150 feet  activity did not occur: Safety/medical concerns  Assist level: Minimal Assistance - Patient > 75% Assistive device: Thinnes-rolling    Walk 10 feet on uneven surface  activity   Assist Walk 10 feet on uneven surfaces activity did not occur: Safety/medical concerns         Wheelchair     Assist Will patient use wheelchair at discharge?: Yes  (Per PT long term goals) Type of Wheelchair: Manual    Wheelchair assist level: Minimal Assistance - Patient > 75% Max wheelchair distance: 150    Wheelchair 50 feet with 2 turns activity    Assist        Assist Level: Minimal Assistance - Patient > 75%   Wheelchair 150 feet activity     Assist      Assist Level: Independent   Blood pressure 128/62, pulse 63, temperature 97.7 F (36.5 C), resp. rate 20, SpO2 97 %.  Medical Problem List and Plan: 1.  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 Formulized individual treatment plan based on medical history, underlying problem and comorbidities.  2.  Antithrombotics: -DVT/anticoagulation:               -antiplatelet therapy: Aspirin 81 mg daily and Plavix 75 mg daily x3 weeks followed by Plavix alone 3. Pain Management: Tylenol as needed. Well controlled 4. Mood: Provide emotional support             -antipsychotic agents: N/A 5. Neuropsych: This patient is capable of making decisions on his own behalf. 6. Skin/Wound Care: Routine skin checks. 7. Fluids/Electrolytes/Nutrition: Routine in and outs. 8.  Hypertension. Tenormin 50 mg daily, Cozaar 25 mg daily, increase to 50 Vitals:   03/02/20 2017 03/03/20 0329  BP: 118/73 128/62  Pulse: 60 63  Resp:    Temp: 98.1 F (36.7 C) 97.7 F (36.5 C)  SpO2: 96% 97%   Relatively controlled on 10/27  9.  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  BPH with TURP.    Latest PVRs without retention 11.    Hyperlipidemia: Lipitor  12.  Hx GERD, used carafate at home may resume if having daily sx  Improved 13.  Spasticity post CVA, flexor withdrawal type add   klonopin qhs prn LOS: 12 days A FACE TO FACE EVALUATION WAS PERFORMED  Charlett Blake 03/03/2020, 9:07 AM

## 2020-03-03 NOTE — Patient Care Conference (Signed)
Inpatient RehabilitationTeam Conference and Plan of Care Update Date: 03/03/2020   Time: 10:43 AM    Patient Name: Hector [name]      Medical Record Number: [id number]  Date of Birth: 1940-05-19 Sex: Male         Room/Bed: 4W05C/4W05C-01 Payor Info: Payor: [employer] MEDICARE / Plan: [employer] MEDICARE CHOICE PPO / Product Type: *No Product type* /    Admit Date/Time:  02/20/2020  3:05 PM  Primary Diagnosis:  Left pontine cerebrovascular accident [facility])  Hospital Problems: Principal Problem:   Left pontine cerebrovascular accident [facility]) Active Problems:   AKI (acute kidney injury) ([place])   Gastroesophageal reflux disease    Expected Discharge Date: Expected Discharge Date: 03/11/20  Team Members Present: Physician leading conference: Dr. Alysia Penna Care Coodinator Present: Dorien Chihuahua, RN, BSN, CRRN;Christina Sampson Goon, [place] Nurse Present: Mohammed Kindle, RN PT Present: Estevan Ryder, PT OT Present: Clyda Greener, OT PPS Coordinator present : Ileana Ladd, PT     Current Status/Progress Goal Weekly Team Focus  Bowel/Bladder   Pt continent B/B  To remain continent of bowel and bladder.  Assess toileting as needed   Swallow/Nutrition/ Hydration             ADL's   Min a bathing, CS UB dressing, Min A LB dressing, functional transfers CGA, using right UE with functional tasks (reach/grasp) as able & has started hand writing and self feeding with built up handle  supervision overall  R NMRE, adl/transfer training, balance, patient/family education   Mobility   supervision bed mobility, CGA sit<>stand and CGA/min assist stand pivot transfers, gait up to 160ft using RW with min assist, 4 steps using B HRs with min assist  supervision overall at ambulatory level  R LE NMR, dynamic standing balance, gait training, transfer training, pt education, activity tolerance   Communication             Safety/Cognition/ Behavioral Observations            Pain   Denies pain  To  remain pain free.  Assess pain q shift   Skin   Rash to upper back  promote healing, prevent futher breakdown  assess skin q shift and prn     Discharge Planning:  Discharging home with daughter and spouse (daughters home). 1 level home, 3 steps to enter   Team Discussion: Medications added per MD for neuropathy, spasms and labile emotions. Neuropsych consulted/following. Progress impaired by knee buckling and limited ability to clear foot when walking. Right hand and arm improvement noted.  Patient on target to meet rehab goals: yes, supervision goals set.  *See Care Plan and progress notes for long and short-term goals.   Revisions to Treatment Plan:  Practice steps with/without handrails  Teaching Needs: Transfers, toileting, medications, etc.   Current Barriers to Discharge: Decreased caregiver support and Home enviroment access/layout  Possible Resolutions to Barriers: Family education     Medical Summary Current Status: leg spasms at night , BPs stabilizing, cont of bowel and bladder  Barriers to Discharge: Medical stability   Possible Resolutions to Barriers/Weekly Focus: medication trial for nocturnal spasms   Continued Need for Acute Rehabilitation Level of Care: The patient requires daily medical management by a physician with specialized training in physical medicine and rehabilitation for the following reasons: Direction of a multidisciplinary physical rehabilitation program to maximize functional independence : Yes Medical management of patient stability for increased activity during participation in an intensive rehabilitation regime.: Yes Analysis of laboratory values  and/or radiology reports with any subsequent need for medication adjustment and/or medical intervention. : Yes   I attest that I was present, lead the team conference, and concur with the assessment and plan of the team.   Dorien Chihuahua B 03/03/2020, 3:49 PM

## 2020-03-04 ENCOUNTER — Inpatient Hospital Stay (HOSPITAL_COMMUNITY): Payer: Medicare PPO

## 2020-03-04 ENCOUNTER — Other Ambulatory Visit: Payer: Self-pay

## 2020-03-04 ENCOUNTER — Inpatient Hospital Stay (HOSPITAL_COMMUNITY): Payer: Medicare PPO | Admitting: Occupational Therapy

## 2020-03-04 MED ORDER — CLONAZEPAM 0.25 MG PO TBDP
0.2500 mg | ORAL_TABLET | Freq: Every evening | ORAL | Status: DC | PRN
  Administered 2020-03-04 – 2020-03-06 (×3): 0.25 mg via ORAL
  Filled 2020-03-04 (×3): qty 1

## 2020-03-04 NOTE — Progress Notes (Signed)
[name] PHYSICAL MEDICINE & REHABILITATION PROGRESS NOTE   Subjective/Complaints:  Did not require klonopin last noc slept well, feels like R leg a little stronger this am   Review of systems: Denies CP, SOB, N/V/D  Objective:   No results found. No results for input(s): WBC, HGB, HCT, PLT in the last 72 hours. No results for input(s): NA, K, CL, CO2, GLUCOSE, BUN, CREATININE, CALCIUM in the last 72 hours.  Intake/Output Summary (Last 24 hours) at 03/04/2020 0833 Last data filed at 03/04/2020 0500 Gross per 24 hour  Intake 240 ml  Output 550 ml  Net -310 ml        Physical Exam: Vital Signs Blood pressure 127/75, pulse 66, temperature 98.2 F (36.8 C), resp. rate 18, SpO2 96 %.   General: No acute distress Mood and affect are appropriate Heart: Regular rate and rhythm no rubs murmurs or extra sounds Lungs: Clear to auscultation, breathing unlabored, no rales or wheezes Abdomen: Positive bowel sounds, soft nontender to palpation, nondistended Extremities: No clubbing, cyanosis, or edema Skin: No evidence of breakdown, no evidence of rash  Neuro: Alert Motor: LUE/LE: 5/5 proximal distal, stable RUE: 3-/5 proximal distal RLE: Hip flexion, knee extension 4-/5, ankle dorsiflexion 2/5, improving  Assessment/Plan: 1. 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  Physiatrist is providing close team supervision and 24 hour management of active medical problems listed below.  Physiatrist and rehab team continue to assess barriers to discharge/monitor patient progress toward functional and medical goals  Care Tool:  Bathing  Bathing activity did not occur: Refused (Declined on this date, however agreeable to bathing at next OT session) Body parts bathed by patient: Right arm, Left arm, Chest, Abdomen, Front perineal area, Buttocks, Right upper leg,  Left upper leg, Left lower leg, Right lower leg, Face   Body parts bathed by helper: Left arm, Buttocks Body parts n/a: Left lower leg, Right lower leg, Left upper leg, Right upper leg, Abdomen, Chest, Left arm, Right arm   Bathing assist Assist Level: Minimal Assistance - Patient > 75%     Upper Body Dressing/Undressing Upper body dressing   What is the patient wearing?: Pull over shirt    Upper body assist Assist Level: Set up assist    Lower Body Dressing/Undressing Lower body dressing      What is the patient wearing?: Pants, Underwear/pull up     Lower body assist Assist for lower body dressing: Minimal Assistance - Patient > 75%     Toileting Toileting Toileting Activity did not occur (Clothing management and hygiene only): N/A (no void or bm)  Toileting assist Assist for toileting: Minimal Assistance - Patient > 75% Assistive Device Comment:  (urinal/stedy)   Transfers Chair/bed transfer  Transfers assist     Chair/bed transfer assist level: Contact Guard/Touching assist Chair/bed transfer assistive device: Armrests   Locomotion Ambulation   Ambulation assist      Assist level: Minimal Assistance - Patient > 75% Assistive device: Lite Gait Max distance: 445   Walk 10 feet activity   Assist     Assist level: Minimal Assistance - Patient > 75% Assistive device: Lite Gait   Walk 50 feet activity   Assist Walk 50 feet with 2 turns activity did not occur: Safety/medical concerns  Assist level: Minimal Assistance - Patient > 75% Assistive device: Lite Gait    Walk 150 feet activity   Assist Walk  150 feet activity did not occur: Safety/medical concerns  Assist level: Minimal Assistance - Patient > 75% Assistive device: Lite Gait    Walk 10 feet on uneven surface  activity   Assist Walk 10 feet on uneven surfaces activity did not occur: Safety/medical concerns         Wheelchair     Assist Will patient use wheelchair at  discharge?: Yes (Per PT long term goals) Type of Wheelchair: Manual    Wheelchair assist level: Minimal Assistance - Patient > 75% Max wheelchair distance: 150    Wheelchair 50 feet with 2 turns activity    Assist        Assist Level: Minimal Assistance - Patient > 75%   Wheelchair 150 feet activity     Assist      Assist Level: Independent   Blood pressure 127/75, pulse 66, temperature 98.2 F (36.8 C), resp. rate 18, SpO2 96 %.  Medical Problem List and Plan: 1.  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  Antithrombotics: -DVT/anticoagulation:               -antiplatelet therapy: Aspirin 81 mg daily and Plavix 75 mg daily x3 weeks followed by Plavix alone 3. Pain Management: Tylenol as needed. Well controlled 4. Mood: Provide emotional support             -antipsychotic agents: N/A 5. Neuropsych: This patient is capable of making decisions on his own behalf. 6. Skin/Wound Care: Routine skin checks. 7. Fluids/Electrolytes/Nutrition: Routine in and outs. 8.  Hypertension. Tenormin 50 mg daily, Cozaar 25 mg daily, increase to 50 Vitals:   03/03/20 2009 03/04/20 0501  BP: 129/65 127/75  Pulse: 70 66  Resp:    Temp: 98.4 F (36.9 C) 98.2 F (36.8 C)  SpO2: 98% 96%   Well controlled 10/28  9.  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  BPH with TURP.    Latest PVRs without retention 11.    Hyperlipidemia: Lipitor  12.  Hx GERD, used carafate at home may resume if having daily sx  Improved 13.  Spasticity post CVA, flexor withdrawal type add  klonopin qhs prn LOS: 13 days A FACE TO FACE EVALUATION WAS PERFORMED  Hector [name] 03/04/2020, 8:33 AM

## 2020-03-04 NOTE — Progress Notes (Signed)
Occupational Therapy Session Note  Patient Details  Name: Hector [name] MRN: [id number] Date of Birth: 1941/02/16  Today's Date: 03/04/2020 OT Individual Time: 1107-1205 OT Individual Time Calculation (min): 58 min    Short Term Goals: Week 2:  OT Short Term Goal 1 (Week 2): Pt will complete LB bathing sit to stand with min guard assist. OT Short Term Goal 2 (Week 2): Pt will complete LB dressing min min guard assist sit to stand. OT Short Term Goal 3 (Week 2): Pt will complete toilet transfers with min guard assist using the RW for support. OT Short Term Goal 4 (Week 2): Pt will use the RUE as an active assist during grooming and bathing tasks with supervision.  Skilled Therapeutic Interventions/Progress Updates:    Pt completed functional mobility to the ortho gym with use of the RW and min assist.  The right foot was ace wrapped to assist with clearing the right foot.  Increased knee flexion noted.  He completed simulated toilet transfer with use of the RW and elevated toilet seat at min guard assist.  Pt reports that his daughter's toilets are around the same height and he feels that with use of the counter on the left side and the RW on the right, that he will be able to stand without need for a 3:1.  Discussed need for a shower seat however, which his daughter can purchase outside of the hospital.  He next transferred over to the UE ergonometer where he worked on Clinical research associate.  He was able to complete sets of 2 mins, 3 mins, and 3 mins using the RUE only.  He completed all sets with resistance on level 1.  The first set was performed for 2 mins without ace wrap on the right hand.  He needed min assist to maintain grip and had to re-grip several times.  The next 2 sets were completed with use of the ace bandage.  The first set was with peddling forward and the second with peddling backwards.  Next, he worked on [employer] up small 1" blocks with the RUE and  working on picking them up in the container as well as stacking them 3 blocks high.  Also worked on placing hand flat on the table and trying to lift his fingers off of the surface without lifting the palm.  Increased difficulty noted when attempting this.  Finished session with ambulation back to the room with min assist and transfer back to the wheelchair.  He was left with the call button and phone in reach with safety alarm in place.    Therapy Documentation Precautions:  Precautions Precautions: Fall Precaution Comments: R-sided weakness Restrictions Weight Bearing Restrictions: No   Pain: Pain Assessment Pain Scale: Faces Pain Score: 0-No pain ADL: See Care Tool Section for some details of mobility  Therapy/Group: Individual Therapy  Desiraye Rolfson OTR/L 03/04/2020, 12:32 PM

## 2020-03-04 NOTE — Progress Notes (Signed)
Physical Therapy Session Note  Patient Details  Name: Hector [name] MRN: [id number] Date of Birth: 05-Mar-1941  Today's Date: 03/04/2020 PT Individual Time: 1330-1441 PT Individual Time Calculation (min): 71 min 58 mins  Short Term Goals: Week 2:  PT Short Term Goal 1 (Week 2): Pt will perform sit<>stands using LRAD with CGA PT Short Term Goal 2 (Week 2): Pt will perform stand pivot transfers using LRAD with CGA PT Short Term Goal 3 (Week 2): Pt will ambulate at least 126ft using LRAD with min assist PT Short Term Goal 4 (Week 2): Pt will ascend/descend 4 steps using HRs with min assist  Skilled Therapeutic Interventions/Progress Updates:    Session 1: Patient received supine in bed, agreeable to PT. He denies pain. Patient able to ambulate without AD and with CGA to bathroom. No evidence of R knee buckling. Patient able to ambulate additional 42ft without AD, with CGA to collect clothing for the day. He was able to engage R UE in this task without prompting or assist. Patient able to don pants, socks and shoes while seated with supervision and extended time. Morning ADLs completed at sink, seated, with set up assist. PT propelling patient in wc to therapy gym for energy conservation. He was able to ambulate 117ft x2 with RW and CGA. 1 bout with dorsiflexion wrap on R LE to assist with R toe clearance. R knee fluctuating between genu recurvatum and buckling ~50% of the time. Patient then ambulating 6x2ft with no AD and CGA. After seated rest break, patient ambulating additional 8x41ft with no AD and CGA. Wither greater fatigue, increased evidence of R knee buckling.   Session 2: Patient received sitting up in wc, agreeable to PT. He denies pain, but endorses fatigue. PT propelling patient in wc to therapy gym for energy conservation. He was able to ambulate 120ft with RW, Swedish knee cage and CGA. Verbal cues needed to engage R quads for full R knee extension with cage preventing hyperextension.  Intermittent R foot drop with increased inversion. Patient demonstrating appropriate stepping response to regain balance. Patient then ambulating additional 168ft with R anterior shell AFO, RW and CGA. Increased R toe clearance noted due to AFO, but significant increase in R knee genu recurvatum in stance. Final 131ft with RW and CGA. Fluctuating between R knee buckling and genu recurvatum 50% of the time with verbal cuing for intentional R foot placement and engagement of R quads + gastroc/soleus for improved R knee control. Patient demonstrating increasing fatigue with repetitions of gait resulting in increase instances of R knee buckling. NMES to R anterior tibs for improved dorsiflexion+ eversion x6 mins. Patient in tall kneeling on mat using R UE to complete cone pass crossing midline 2x8. Half kneeling on R LE completing cone pass x6, half kneeling on L LE completing cone pass x6. Improving R hip stability noted in these developmental positions. Patient transferring back to wc via stand pivot with CGA. Seated UE ex: chest press 3# 2x12, seated shoulder press 3# 2x8, seated rowing with trunk rotation x12. Patient returning to bed via stand pivot with CGA. Bed alarm on, call light within reach.   Therapy Documentation Precautions:  Precautions Precautions: Fall Precaution Comments: R-sided weakness Restrictions Weight Bearing Restrictions: No    Therapy/Group: Individual Therapy  Karoline Caldwell, PT, DPT, CBIS 03/04/2020, 7:36 AM

## 2020-03-05 ENCOUNTER — Inpatient Hospital Stay (HOSPITAL_COMMUNITY): Payer: Medicare PPO | Admitting: Occupational Therapy

## 2020-03-05 ENCOUNTER — Inpatient Hospital Stay (HOSPITAL_COMMUNITY): Payer: Medicare PPO

## 2020-03-05 ENCOUNTER — Inpatient Hospital Stay (HOSPITAL_COMMUNITY): Payer: Medicare PPO | Admitting: Physical Therapy

## 2020-03-05 NOTE — Progress Notes (Signed)
Physical Therapy Weekly Progress Note  Patient Details  Name: Hector [name] MRN: [id number] Date of Birth: 22-Jan-1941  Beginning of progress report period: February 27, 2020 End of progress report period: March 05, 2020    Patient has met 4 of 4 short term goals.  Patient has made excellent progress toward his STG and LTG. He is able to walk further distances prior to demonstrating poor R knee stability, though he is limited in this distance to ~57ft prior to requiring CGA. Patient has sufficient R quad strength to prevent full buckling on R knee, however, R gastoc/soleus lacks sufficient strength to help support R knee stability in stance phase of gait. R knee stability further compromised by unequal timing of R LE muscle activation. Patient able to negotiate 4steps with HR use and CGA. Improving ability to complete these steps without HR use.   Patient continues to demonstrate the following deficits muscle weakness, decreased cardiorespiratoy endurance, impaired timing and sequencing, abnormal tone and unbalanced muscle activation and decreased standing balance, hemiplegia and decreased balance strategies and therefore will continue to benefit from skilled PT intervention to increase functional independence with mobility.  Patient progressing toward long term goals..  Continue plan of care.  PT Short Term Goals Week 2:  PT Short Term Goal 1 (Week 2): Pt will perform sit<>stands using LRAD with CGA PT Short Term Goal 1 - Progress (Week 2): Met PT Short Term Goal 2 (Week 2): Pt will perform stand pivot transfers using LRAD with CGA PT Short Term Goal 2 - Progress (Week 2): Met PT Short Term Goal 3 (Week 2): Pt will ambulate at least 135ft using LRAD with min assist PT Short Term Goal 3 - Progress (Week 2): Met PT Short Term Goal 4 (Week 2): Pt will ascend/descend 4 steps using HRs with min assist PT Short Term Goal 4 - Progress (Week 2): Met Week 3:  PT Short Term Goal 1 (Week 3): STG =  LTG due to ELOS   Therapy Documentation Precautions:  Precautions Precautions: Fall Precaution Comments: R-sided weakness Restrictions Weight Bearing Restrictions: No   Debbora Dus 03/05/2020, 11:50 AM

## 2020-03-05 NOTE — Progress Notes (Signed)
Physical Therapy Session Note  Patient Details  Name: Hector [name] MRN: [id number] Date of Birth: 16-May-1940  Today's Date: 03/05/2020 PT Individual Time: 1707-1750 PT Individual Time Calculation (min): 43 min   Short Term Goals: Week 3:  PT Short Term Goal 1 (Week 3): STG = LTG due to ELOS  Skilled Therapeutic Interventions/Progress Updates:    Pt received supine in bed with his daughter, Cecille Rubin, present and pt agreeable to therapy session. Supine>sitting L EOB, HOB partially elevated, with supervision. Sit<>stands using RW with close supervision/CGA for safety during session. Gait training ~255ft to main therapy gym with min assist due to intermittent R LE toe catching during swing and then when does clear foot has worsening R heel whip with decreased hamstring eccentric control to slow speed of movement - demos only a few instances of R knee flexion during stance but no buckling - demos decreased safety awareness due to not slowing down after minor LOBs when not clearing R foot during swing. Standing>tall kneeling on mat with BUE support on bench and min assist for balance. In tall kneeling performed the following:  - tall kneeling squats without UE support x8 reps with min assist for balance - L LE knee stepping in/out targeting R LE hip abductor and extensor activation in weightbearing; no UE support and min assist for balance - R LE knee stepping in/out working on open chain and L weight shift; no UE support and min assist for balance - L UE reaching with R UE on bench to promote weight bearing with min assist for balance  Tall kneeling>sitting in w/c using UE support with min assist for balance. Gait training ~59ft using RW with min assist and +2 assist providing level 1 theraband resistance on R LE to promote increased ankle DF and hip flexor activation to improve foot clearance during swing. Gait training ~39ft using RW with min assist and +2 assist providing external target for pt to  step over to promote increased foot clearance with pt also demoing increased control of forward movement of tibia. Transported back to room and pt agreeable to remain seated in w/c. Pt's daughter expresses concerns regarding discharge along with questions on plan for family education/training - educated her on plan for family education at least 2 days prior to D/C next week and recommendation for follow-up OPPT along with current DME recs for RW. Pt left seated in w/c with needs in reach and chair alarm on.  Therapy Documentation Precautions:  Precautions Precautions: Fall Precaution Comments: R-sided weakness Restrictions Weight Bearing Restrictions: No  Pain:   Denies pain during session.   Therapy/Group: Individual Therapy  Tawana Scale , PT, DPT, CSRS  03/05/2020, 4:19 PM

## 2020-03-05 NOTE — Progress Notes (Signed)
Physical Therapy Session Note  Patient Details  Name: Hector [name] MRN: [id number] Date of Birth: 08-05-1940  Today's Date: 03/05/2020 PT Individual Time: 7371-0626 PT Individual Time Calculation (min): 61 min   Short Term Goals: Week 2:  PT Short Term Goal 1 (Week 2): Pt will perform sit<>stands using LRAD with CGA PT Short Term Goal 2 (Week 2): Pt will perform stand pivot transfers using LRAD with CGA PT Short Term Goal 3 (Week 2): Pt will ambulate at least 156ft using LRAD with min assist PT Short Term Goal 4 (Week 2): Pt will ascend/descend 4 steps using HRs with min assist  Skilled Therapeutic Interventions/Progress Updates:    Patient received sitting up in wc, agreeable to PT. He denies pain. PT propelling patient in wc to dayroom to energy conservation. He was able to ambulate ~187ft with RW and CGA to participate in various [employer] tasks requiring dynamic standing balance and engagement of R UE. Patient able to maintain dynamic standing balance to complete tossing task with R UE and no UE support + supervision. No evidence of R knee buckling with standing tasks. Patient increasing ambulation distance prior to R knee fatigue. NuStep x10 mins completed for B LE reciprocal stepping and R quad activation prior to continuing gait tx. Patient ambulating additional 35ft with RW and Supervision/ CGA. Supervision required up to 58ft and then, due to increasing fatigue, CGA provided. With R knee buckling, patient apparently able to control depth of bucking. However, R femoral internal rotation noted. Increasing R heel whip with fatigue increasing patients risk for tripping/falling. As well as increasing R foot drop with fatigue. Patient returning to room in wc, chair alarm on, call light within reach.   Therapy Documentation Precautions:  Precautions Precautions: Fall Precaution Comments: R-sided weakness Restrictions Weight Bearing Restrictions: No    Therapy/Group: Individual  Therapy  Debbora Dus 03/05/2020, 7:48 AM

## 2020-03-05 NOTE — Progress Notes (Signed)
[facility] PHYSICAL MEDICINE & REHABILITATION PROGRESS NOTE   Subjective/Complaints:  No issues overnight  Review of systems: Denies CP, SOB, N/V/D  Objective:   No results found. No results for input(s): WBC, HGB, HCT, PLT in the last 72 hours. No results for input(s): NA, K, CL, CO2, GLUCOSE, BUN, CREATININE, CALCIUM in the last 72 hours.  Intake/Output Summary (Last 24 hours) at 03/05/2020 0847 Last data filed at 03/05/2020 0650 Gross per 24 hour  Intake 860 ml  Output 900 ml  Net -40 ml        Physical Exam: Vital Signs Blood pressure 135/84, pulse 66, temperature 97.8 F (36.6 C), temperature source Oral, resp. rate 16, height 5\' 11"  (1.803 m), SpO2 98 %.   General: No acute distress Mood and affect are appropriate Heart: Regular rate and rhythm no rubs murmurs or extra sounds Lungs: Clear to auscultation, breathing unlabored, no rales or wheezes Abdomen: Positive bowel sounds, soft nontender to palpation, nondistended Extremities: No clubbing, cyanosis, or edema Skin: No evidence of breakdown, no evidence of rash  Neuro: Alert Motor: LUE/LE: 5/5 proximal distal, stable RUE: 3-/5 proximal distal RLE: Hip flexion, knee extension 4-/5, ankle dorsiflexion 2/5, improving  Assessment/Plan: 1. 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  Physiatrist is providing close team supervision and 24 hour management of active medical problems listed below.  Physiatrist and rehab team continue to assess barriers to discharge/monitor patient progress toward functional and medical goals  Care Tool:  Bathing  Bathing activity did not occur: Refused (Declined on this date, however agreeable to bathing at next OT session) Body parts bathed by patient: Right arm, Left arm, Chest, Abdomen, Front perineal area, Buttocks, Right upper leg, Left upper leg, Left  lower leg, Right lower leg, Face   Body parts bathed by helper: Left arm, Buttocks Body parts n/a: Left lower leg, Right lower leg, Left upper leg, Right upper leg, Abdomen, Chest, Left arm, Right arm   Bathing assist Assist Level: Minimal Assistance - Patient > 75%     Upper Body Dressing/Undressing Upper body dressing   What is the patient wearing?: Pull over shirt    Upper body assist Assist Level: Set up assist    Lower Body Dressing/Undressing Lower body dressing      What is the patient wearing?: Pants, Underwear/pull up     Lower body assist Assist for lower body dressing: Minimal Assistance - Patient > 75%     Toileting Toileting Toileting Activity did not occur (Clothing management and hygiene only): N/A (no void or bm)  Toileting assist Assist for toileting: Minimal Assistance - Patient > 75% Assistive Device Comment:  (urinal/stedy)   Transfers Chair/bed transfer  Transfers assist     Chair/bed transfer assist level: Contact Guard/Touching assist Chair/bed transfer assistive device: Armrests   Locomotion Ambulation   Ambulation assist      Assist level: Contact Guard/Touching assist Assistive device: Kyte-rolling Max distance: 150   Walk 10 feet activity   Assist     Assist level: Contact Guard/Touching assist Assistive device: [place]-rolling   Walk 50 feet activity   Assist Walk 50 feet with 2 turns activity did not occur: Safety/medical concerns  Assist level: Contact Guard/Touching assist Assistive device: Muzio-rolling    Walk 150 feet activity   Assist Walk 150 feet activity did not occur: Safety/medical concerns  Assist level: Contact Guard/Touching assist Assistive device: Fullbright-rolling  Walk 10 feet on uneven surface  activity   Assist Walk 10 feet on uneven surfaces activity did not occur: Safety/medical concerns         Wheelchair     Assist Will patient use wheelchair at discharge?: Yes (Per PT  long term goals) Type of Wheelchair: Manual    Wheelchair assist level: Minimal Assistance - Patient > 75% Max wheelchair distance: 150    Wheelchair 50 feet with 2 turns activity    Assist        Assist Level: Minimal Assistance - Patient > 75%   Wheelchair 150 feet activity     Assist      Assist Level: Independent   Blood pressure 135/84, pulse 66, temperature 97.8 F (36.6 C), temperature source Oral, resp. rate 16, height 5\' 11"  (1.803 m), SpO2 98 %.  Medical Problem List and Plan: 1.  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  Antithrombotics: -DVT/anticoagulation:               -antiplatelet therapy: Aspirin 81 mg daily and Plavix 75 mg daily x3 weeks followed by Plavix alone, this should be around the time of discharge 3. Pain Management: Tylenol as needed. Well controlled 4. Mood: Provide emotional support             -antipsychotic agents: N/A 5. Neuropsych: This patient is capable of making decisions on his own behalf. 6. Skin/Wound Care: Routine skin checks. 7. Fluids/Electrolytes/Nutrition: Routine in and outs. 8.  Hypertension. Tenormin 50 mg daily, Cozaar 25 mg daily, increase to 50 Vitals:   03/04/20 1927 03/05/20 0510  BP: 122/68 135/84  Pulse: 69 66  Resp: 16 16  Temp: 98.5 F (36.9 C) 97.8 F (36.6 C)  SpO2: 97% 98%   Well controlled 10/29  9.  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 oncology 10.  BPH with TURP.    Latest PVRs without retention 11.    Hyperlipidemia: Lipitor  12.  Hx GERD, used carafate at home may resume if having daily sx  Improved 13.  Spasticity post CVA, flexor withdrawal type improved with klonopin qhs prn LOS: 14 days A FACE TO FACE EVALUATION WAS PERFORMED  Charlett Blake 03/05/2020, 8:47 AM

## 2020-03-05 NOTE — Plan of Care (Signed)
  Problem: RH Ambulation Goal: LTG Patient will ambulate in controlled environment (PT) Description: LTG: Patient will ambulate in a controlled environment, # of feet with assistance (PT). Flowsheets (Taken 03/05/2020 1156) LTG: Pt will ambulate in controlled environ  assist needed:: (downgraded due to patient progress) Contact Guard/Touching assist Note: downgraded due to patient progress Goal: LTG Patient will ambulate in home environment (PT) Description: LTG: Patient will ambulate in home environment, # of feet with assistance (PT). Flowsheets (Taken 03/05/2020 1156) LTG: Pt will ambulate in home environ  assist needed:: (Downgraded due to patient progress) Contact Guard/Touching assist Note: Downgraded due to patient progress   Problem: RH Wheelchair Mobility Goal: LTG Patient will propel w/c in controlled environment (PT) Description: LTG: Patient will propel wheelchair in controlled environment, # of feet with assist (PT) Flowsheets (Taken 03/05/2020 1156) LTG: Pt will propel w/c in controlled environ  assist needed:: (discontinued as patient will be a functional ambulator at dc) Other (Comment) Note: discontinued as patient will be a functional ambulator at dc

## 2020-03-05 NOTE — Progress Notes (Signed)
Occupational Therapy Session Note  Patient Details  Name: Hector [name] MRN: [id number] Date of Birth: 1940-12-05  Today's Date: 03/05/2020 OT Individual Time: 7618-4859 OT Individual Time Calculation (min): 48 min    Short Term Goals: Week 3:  OT Short Term Goal 1 (Week 3): Continue working on established LTGs set at supervision level overall.  Skilled Therapeutic Interventions/Progress Updates:    Treatment session with focus on functional mobility, standing balance, and RUE and RLE NMR.  Pt received upright in w/c agreeable to therapy session.  Pt ambulated 125' with RW with min assist with intermittent Rt toe drag and Rt knee buckling.  Pt required mod assist towards end of ambulation bout due to extreme buckling of Rt knee, requiring physical assistance to regain balance.  Pt transported remainder of distance to therapy gym in w/c.  Engaged in [place] and RLE NMR in standing with focus on increased WB and knee stability when standing to complete table top task, therapist providing intermittent facilitation at knee for increased knee stability. Utilized resistive clothespins (red) to address grasp and pinch while incorporating reaching to place on vertical surface.  Modified task to reaching towards lower target due to difficulty maintaining pinch when reaching.  Utilized pegs and pegboard with focus on object manipulation and coordination with placing and removing pegs. Pt demonstrating decreased wrist flexion to appropriately place pegs in holes.  Therapist challenged pt to attempt with smaller pegs, however only able to grasp from upright in pegboard, unable to manipulate or maintain grasp to place in holes.  Discussed continued activities that he can complete in room with items provided by primary therapist to continue to address motor control and coordination.  Pt returned to room and transferred back to bed min assist.  Pt remained semi-reclined with all needs in reach.  Therapy  Documentation Precautions:  Precautions Precautions: Fall Precaution Comments: R-sided weakness Restrictions Weight Bearing Restrictions: No General:   Vital Signs: Therapy Vitals Temp: 98 F (36.7 C) Temp Source: Oral Pulse Rate: 66 Resp: 18 BP: 121/70 Patient Position (if appropriate): Lying Oxygen Therapy SpO2: 97 % Pain:  Pt with no c/o pain   Therapy/Group: Individual Therapy  Simonne Come 03/05/2020, 3:56 PM

## 2020-03-05 NOTE — Progress Notes (Signed)
Occupational Therapy Weekly Progress Note  Patient Details  Name: Hector [name] MRN: [id number] Date of Birth: 05-Aug-1940  Beginning of progress report period: February 28, 2020 End of progress report period: March 05, 2020  Today's Date: 03/05/2020 OT Individual Time: 0903-1000 OT Individual Time Calculation (min): 57 min    Patient has met 4 of 4 short term goals.  Hector [name] is making great progress with OT at this time.  He has improved with ADL function to a supervision level for UB selfcare and min guard for LB sit to stand.  Tranfers with the RW are at min guard assist as well to the toilet and the shower.  Without assistive device he still needs min assist with increased right knee flexion noted as well as decreased dorsiflexion with heel strike.  He also continues to demonstrate weakness in the RUE, however this is continuing to improve and he currently is a Brunnstrum stage V level in the arm and hand.  He can use the RUE for bathing tasks with supervision and increased time, but needs min to mod assist to integrate into pulling up clothing.  Overall feel he is progressing well to meet established supervision level goals.  Recommend continued CIR level therapy until discharge on 11/4  Patient continues to demonstrate the following deficits: muscle weakness and muscle paralysis, impaired timing and sequencing, unbalanced muscle activation and decreased coordination and decreased standing balance, hemiplegia and decreased balance strategies and therefore will continue to benefit from skilled OT intervention to enhance overall performance with BADL, iADL and Reduce care partner burden.  Patient progressing toward long term goals..  Continue plan of care.  OT Short Term Goals Week 3:  OT Short Term Goal 1 (Week 3): Continue working on established LTGs set at supervision level overall.  Skilled Therapeutic Interventions/Progress Updates:    Pt worked on shower and dressing to begin  session.  He was able to transfer from supine to sit with supervision and then ambulate around the room to gather items for dressing and for transferring to the shower bench with min assist and no device.  He worked in standing on removing his dirty clothing in standing with min assist when lifting his LEs to remove his pants and socks.  He was able to complete all bathing sit to stand in the shower with integrated use of the RUE for washing the left arm and other parts with supervision.  He worked on dressing tasks at the sink as well with min guard sit to stand and increased time for donning his socks using BUEs to donn.  He finished ADL with oral hygiene at the sink with setup in seated position.  Educated pt on FM coordination task of picking up small foam pieces and placing them in a cup with the RUE.  Discussed grading techniques of holding the cup with the right hand to start and progressing to placing the cup on the table and not turning it over when putting the pieces in it.  He was able to return demonstrate.  Finished session with pt in the wheelchair and with the call button and phone in reach.    Therapy Documentation Precautions:  Precautions Precautions: Fall Precaution Comments: R-sided weakness Restrictions Weight Bearing Restrictions: No   Pain: Pain Assessment Pain Scale: Faces Pain Score: 0-No pain ADL: See Care Tool Section for some details of mobility and selfcare  Therapy/Group: Individual Therapy  Abbygale Lapid OTR/L 03/05/2020, 12:44 PM

## 2020-03-06 ENCOUNTER — Inpatient Hospital Stay (HOSPITAL_COMMUNITY): Payer: Medicare PPO

## 2020-03-06 NOTE — Plan of Care (Signed)
  Problem: Consults Goal: RH STROKE PATIENT EDUCATION Description: See Patient Education module for education specifics  Outcome: Progressing   Problem: RH BOWEL ELIMINATION Goal: RH STG MANAGE BOWEL W/MEDICATION W/ASSISTANCE Description: STG Manage Bowel with Medication with mod I Assistance. Outcome: Progressing   Problem: RH BLADDER ELIMINATION Goal: RH STG MANAGE BLADDER WITH EQUIPMENT WITH ASSISTANCE Description: STG Manage Bladder With Equipment With mod I Assistance Outcome: Progressing   Problem: RH SKIN INTEGRITY Goal: RH STG MAINTAIN SKIN INTEGRITY WITH ASSISTANCE Description: STG Maintain Skin Integrity With mod I Assistance. Outcome: Progressing   Problem: RH SAFETY Goal: RH STG ADHERE TO SAFETY PRECAUTIONS W/ASSISTANCE/DEVICE Description: STG Adhere to Safety Precautions With cues and reminders. Outcome: Progressing Goal: RH STG DECREASED RISK OF FALL WITH ASSISTANCE Description: STG Decreased Risk of Fall With cues and reminders Outcome: Progressing   Problem: RH COGNITION-NURSING Goal: RH STG USES MEMORY AIDS/STRATEGIES W/ASSIST TO PROBLEM SOLVE Description: STG Uses Memory Aids/Strategies With reminder cues and reminders Assistance to Problem Solve. Outcome: Progressing Goal: RH STG ANTICIPATES NEEDS/CALLS FOR ASSIST W/ASSIST/CUES Description: STG Anticipates Needs/Calls for Assist With cues and reminders Outcome: Progressing   Problem: RH PAIN MANAGEMENT Goal: RH STG PAIN MANAGED AT OR BELOW PT'S PAIN GOAL Description: Pain level less than 4 on scale of 0-10 Outcome: Progressing   Problem: RH KNOWLEDGE DEFICIT Goal: RH STG INCREASE KNOWLEDGE OF HYPERTENSION Description: Pt will be able to demonstrate understanding of medication regimen, dietary and lifestyle modifications to better control hypertension and prevent stroke with mod I assist using handouts and booklet provided.  Outcome: Progressing Goal: RH STG INCREASE KNOWLEDGE OF STROKE  PROPHYLAXIS Description: Pt will be able to demonstrate understanding of medication regimen, dietary and lifestyle modifications to better control hypertension and prevent stroke with mod I assist using handouts and booklet provided.  Outcome: Progressing

## 2020-03-06 NOTE — Progress Notes (Signed)
Physical Therapy Session Note  Patient Details  Name: Hector [name] MRN: [id number] Date of Birth: [birth year]  Today's Date: 03/06/2020 PT Individual Time: 1000-1100 + 28 min PT Individual Time Calculation (min): 60 min + 28 min  Short Term Goals: Week 2:  PT Short Term Goal 1 (Week 2): Pt will perform sit<>stands using LRAD with CGA PT Short Term Goal 1 - Progress (Week 2): Met PT Short Term Goal 2 (Week 2): Pt will perform stand pivot transfers using LRAD with CGA PT Short Term Goal 2 - Progress (Week 2): Met PT Short Term Goal 3 (Week 2): Pt will ambulate at least 130ft using LRAD with min assist PT Short Term Goal 3 - Progress (Week 2): Met PT Short Term Goal 4 (Week 2): Pt will ascend/descend 4 steps using HRs with min assist PT Short Term Goal 4 - Progress (Week 2): Met Week 3:  PT Short Term Goal 1 (Week 3): STG = LTG due to ELOS  Skilled Therapeutic Interventions/Progress Updates:   1st session:   Pt received sitting in w/c, agreeable to PT session without c/o pain. Pt in process of dressing on arrival. He was able to doff shirt with supervision and donned clean t-shirt with supervision as well, completed seated in w/c. He was also able to remove hospital socks and replace with his own socks, then placing slip-on shoes, completed with supervision via figure-4 technique. Effortful for lower body dressing, brief rest breaks provided during this. Pt expressing frustration regarding increased effort for simple tasks. WC transport for time management from his room to dayroom gym for energy conservation. Performed TUG x3 trials, scoring 31 seconds, 30 seconds, 27 seconds, with RW support and therapist providing CGA. Scores indicative of increased falls risk. Then focused session on RLE NMR and strengthening, performed eccentric step offs from 4 inch block with minA for standing balance and RW for support. Also completed step-ups onto 4inch block with R foot leading ascent, with minA guard  and RW support. Pt reporting increased fatigue after completing this, requiring extended rest break. Note intermittent R knee hyperextension while completing this activities. Focused remainder of session on gait training. Setup on LiteGait over treadmill, able to maintain standing while applying harness. He ambulated a total of 370ft at 1.0 mph without incline over treadmill. Emphasized during gait R heel strike, R foot clearance, and cadence. Pt prefer's to have downward gaze while ambulating and gait deficits worsen when instructed to have forward gaze. Pt reporting moderate fatigue, removed harness as outlined above. WC transport back to his room with totalA for time management, he remained seated in w/c at end of session with chair alarm on, needs in reach.  2nd session: Pt received supine in bed, daughter at bedside, pt agreeable to PT session with no reports of pain. Supine<>sit with supervision with use of bed features, HOB elevated ~30deg. Donned shoes with setupA while seated EOB via figure-4 technique. Stand<>pivot with CGA from EOB to w/c with no AD, cues for safety approach and BUE placement. WC transport for time management from his room to main therapy gym. Performed stair negotiation, up/down x12 steps with minA and B HR support, step-to pattern with cues required for L foot leading ascent and R foot leading descent. After seated rest break, attempted stairs again, this time without HR support. He required modA for negotiating up/down x4 steps with no HR's, increased difficulty with descent > ascent. Focused remainder of session on RLE strengthening. Ambulated 107ft with minA and  no AD to outside of // bars. While facing // bars with BUE support on bars, performed the following there-ex in standing with minA guard: -1x10 RLE hip abduction -1x10 RLE hamstring curls -1x10 RLE mini-squats -1x10 BLE heel raises -1x10 modified wall push-ups  WC transport back to his room where he remained seated  in w/c at end of session, needs in reach, chair alarm on, daughter at bedside.  Therapy Documentation Precautions:  Precautions Precautions: Fall Precaution Comments: R-sided weakness Restrictions Weight Bearing Restrictions: No  Therapy/Group: Individual Therapy  Laquandra Carrillo P Aragon Scarantino PT 03/06/2020, 12:53 PM

## 2020-03-06 NOTE — Progress Notes (Signed)
[place] PHYSICAL MEDICINE & REHABILITATION PROGRESS NOTE   Subjective/Complaints:  Slept well. No pain. No c/o  ROS: Patient denies fever, rash, sore throat, blurred vision, nausea, vomiting, diarrhea, cough, shortness of breath or chest pain, joint or back pain, headache, or mood change.   Objective:   No results found. No results for input(s): WBC, HGB, HCT, PLT in the last 72 hours. No results for input(s): NA, K, CL, CO2, GLUCOSE, BUN, CREATININE, CALCIUM in the last 72 hours.  Intake/Output Summary (Last 24 hours) at 03/06/2020 1032 Last data filed at 03/06/2020 0655 Gross per 24 hour  Intake --  Output 800 ml  Net -800 ml        Physical Exam: Vital Signs Blood pressure 134/73, pulse 66, temperature 97.8 F (36.6 C), resp. rate 18, height 5\' 11"  (1.803 m), SpO2 96 %.   Constitutional: No distress . Vital signs reviewed. HEENT: EOMI, oral membranes moist Neck: supple Cardiovascular: RRR without murmur. No JVD    Respiratory/Chest: CTA Bilaterally without wheezes or rales. Normal effort    GI/Abdomen: BS +, non-tender, non-distended Ext: no clubbing, cyanosis, or edema Psych: pleasant and cooperative Skin: No evidence of breakdown, no evidence of rash  Neuro: Alert Motor: LUE/LE: 5/5 proximal distal, stable RUE: 3-/5 proximal distal RLE: Hip flexion, knee extension 4-/5, ankle dorsiflexion 2/5, improving  Assessment/Plan: 1. 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  Physiatrist is providing close team supervision and 24 hour management of active medical problems listed below.  Physiatrist and rehab team continue to assess barriers to discharge/monitor patient progress toward functional and medical goals  Care Tool:  Bathing  Bathing activity did not occur: Refused (Declined on this date, however agreeable to bathing at next OT  session) Body parts bathed by patient: Right arm, Left arm, Chest, Abdomen, Front perineal area, Buttocks, Right upper leg, Left upper leg, Left lower leg, Right lower leg, Face   Body parts bathed by helper: Left arm, Buttocks Body parts n/a: Left lower leg, Right lower leg, Left upper leg, Right upper leg, Abdomen, Chest, Left arm, Right arm   Bathing assist Assist Level: Contact Guard/Touching assist     Upper Body Dressing/Undressing Upper body dressing   What is the patient wearing?: Pull over shirt    Upper body assist Assist Level: Set up assist    Lower Body Dressing/Undressing Lower body dressing      What is the patient wearing?: Pants, Underwear/pull up     Lower body assist Assist for lower body dressing: Contact Guard/Touching assist     Toileting Toileting Toileting Activity did not occur (Clothing management and hygiene only): N/A (no void or bm)  Toileting assist Assist for toileting: Minimal Assistance - Patient > 75% Assistive Device Comment:  (urinal/stedy)   Transfers Chair/bed transfer  Transfers assist     Chair/bed transfer assist level: Contact Guard/Touching assist Chair/bed transfer assistive device: Programmer, multimedia   Ambulation assist      Assist level: Minimal Assistance - Patient > 75% Assistive device: [place]-rolling Max distance: 241ft   Walk 10 feet activity   Assist     Assist level: Minimal Assistance - Patient > 75% Assistive device: Hildenbrand-rolling   Walk 50 feet activity   Assist Walk 50 feet with 2 turns activity did not occur: Safety/medical concerns  Assist level: Minimal Assistance - Patient > 75% Assistive device: Lina-rolling  Walk 150 feet activity   Assist Walk 150 feet activity did not occur: Safety/medical concerns  Assist level: Minimal Assistance - Patient > 75% Assistive device: [place]-rolling    Walk 10 feet on uneven surface  activity   Assist Walk 10 feet on uneven  surfaces activity did not occur: Safety/medical concerns         Wheelchair     Assist Will patient use wheelchair at discharge?: Yes (Per PT long term goals) Type of Wheelchair: Manual    Wheelchair assist level: Minimal Assistance - Patient > 75% Max wheelchair distance: 150    Wheelchair 50 feet with 2 turns activity    Assist        Assist Level: Minimal Assistance - Patient > 75%   Wheelchair 150 feet activity     Assist      Assist Level: Independent   Blood pressure 134/73, pulse 66, temperature 97.8 F (36.6 C), resp. rate 18, height 5\' 11"  (1.803 m), SpO2 96 %.  Medical Problem List and Plan: 1.  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  Antithrombotics: -DVT/anticoagulation:               -antiplatelet therapy: Aspirin 81 mg daily and Plavix 75 mg daily x3 weeks followed by Plavix alone, this should be around the time of discharge 3. Pain Management: Tylenol as needed. Well controlled 4. Mood: Provide emotional support             -antipsychotic agents: N/A 5. Neuropsych: This patient is capable of making decisions on his own behalf. 6. Skin/Wound Care: Routine skin checks. 7. Fluids/Electrolytes/Nutrition: Routine in and outs. 8.  Hypertension. Tenormin 50 mg daily, Cozaar 25 mg daily, increase to 50 Vitals:   03/05/20 2055 03/06/20 0534  BP: (!) 145/67 134/73  Pulse: 62 66  Resp: 18 18  Temp: 98.1 F (36.7 C) 97.8 F (36.6 C)  SpO2: 96% 96%   Well controlled 10/30  9.  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  BPH with TURP.    Latest PVRs without retention 11.    Hyperlipidemia: Lipitor  12.  Hx GERD, used carafate at home may resume if having daily sx  Improved 13.  Spasticity post CVA, flexor withdrawal type improved with klonopin qhs  prn   LOS: 15 days A FACE TO FACE EVALUATION WAS PERFORMED  Meredith Staggers 03/06/2020, 10:32 AM

## 2020-03-07 ENCOUNTER — Inpatient Hospital Stay (HOSPITAL_COMMUNITY): Payer: Medicare PPO

## 2020-03-07 NOTE — Progress Notes (Signed)
[place] PHYSICAL MEDICINE & REHABILITATION PROGRESS NOTE   Subjective/Complaints:  Still complains of right arm,leg being sore from "working out". Discomfort is mild though. Otherwise doing quite well  ROS: Patient denies fever, rash, sore throat, blurred vision, nausea, vomiting, diarrhea, cough, shortness of breath or chest pain,   headache, or mood change.    Objective:   No results found. No results for input(s): WBC, HGB, HCT, PLT in the last 72 hours. No results for input(s): NA, K, CL, CO2, GLUCOSE, BUN, CREATININE, CALCIUM in the last 72 hours.  Intake/Output Summary (Last 24 hours) at 03/07/2020 0934 Last data filed at 03/07/2020 0730 Gross per 24 hour  Intake 722 ml  Output 775 ml  Net -53 ml        Physical Exam: Vital Signs Blood pressure 139/70, pulse 68, temperature 97.8 F (36.6 C), resp. rate 16, height 5\' 11"  (1.803 m), SpO2 96 %.   Constitutional: No distress . Vital signs reviewed. HEENT: EOMI, oral membranes moist Neck: supple Cardiovascular: RRR without murmur. No JVD    Respiratory/Chest: CTA Bilaterally without wheezes or rales. Normal effort    GI/Abdomen: BS +, non-tender, non-distended Ext: no clubbing, cyanosis, or edema Psych: pleasant and cooperative Skin: No evidence of breakdown, no evidence of rash  Neuro: Alert Motor: LUE/LE: 5/5 proximal distal, stable RUE: 3- to 3/5 proximal distal RLE: Hip flexion, knee extension 4-/5, ankle dorsiflexion 2/5--stable exam  Assessment/Plan: 1. 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  Physiatrist is providing close team supervision and 24 hour management of active medical problems listed below.  Physiatrist and rehab team continue to assess barriers to discharge/monitor patient progress toward functional and medical goals  Care Tool:  Bathing  Bathing activity did  not occur: Refused (Declined on this date, however agreeable to bathing at next OT session) Body parts bathed by patient: Right arm, Left arm, Chest, Abdomen, Front perineal area, Buttocks, Right upper leg, Left upper leg, Left lower leg, Right lower leg, Face   Body parts bathed by helper: Left arm, Buttocks Body parts n/a: Left lower leg, Right lower leg, Left upper leg, Right upper leg, Abdomen, Chest, Left arm, Right arm   Bathing assist Assist Level: Contact Guard/Touching assist     Upper Body Dressing/Undressing Upper body dressing   What is the patient wearing?: Pull over shirt    Upper body assist Assist Level: Set up assist    Lower Body Dressing/Undressing Lower body dressing      What is the patient wearing?: Pants, Underwear/pull up     Lower body assist Assist for lower body dressing: Contact Guard/Touching assist     Toileting Toileting Toileting Activity did not occur (Clothing management and hygiene only): N/A (no void or bm)  Toileting assist Assist for toileting: Minimal Assistance - Patient > 75% Assistive Device Comment:  (urinal/stedy)   Transfers Chair/bed transfer  Transfers assist     Chair/bed transfer assist level: Contact Guard/Touching assist Chair/bed transfer assistive device: Programmer, multimedia   Ambulation assist      Assist level: Minimal Assistance - Patient > 75% Assistive device: [place]-rolling Max distance: 28ft   Walk 10 feet activity   Assist     Assist level: Minimal Assistance - Patient > 75% Assistive device: [place]-rolling   Walk 50 feet activity   Assist Walk 50 feet with 2 turns activity did not occur: Safety/medical concerns  Assist level: Minimal Assistance - Patient > 75% Assistive device: [place]-rolling    Walk 150 feet activity   Assist Walk 150 feet activity did not occur: Safety/medical concerns  Assist level: Minimal Assistance - Patient > 75% Assistive device:  [place]-rolling    Walk 10 feet on uneven surface  activity   Assist Walk 10 feet on uneven surfaces activity did not occur: Safety/medical concerns         Wheelchair     Assist Will patient use wheelchair at discharge?: Yes (Per PT long term goals) Type of Wheelchair: Manual    Wheelchair assist level: Minimal Assistance - Patient > 75% Max wheelchair distance: 150    Wheelchair 50 feet with 2 turns activity    Assist        Assist Level: Minimal Assistance - Patient > 75%   Wheelchair 150 feet activity     Assist      Assist Level: Independent   Blood pressure 139/70, pulse 68, temperature 97.8 F (36.6 C), resp. rate 16, height 5\' 11"  (1.803 m), SpO2 96 %.  Medical Problem List and Plan: 1.  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  Antithrombotics: -DVT/anticoagulation:               -antiplatelet therapy: Aspirin 81 mg daily and Plavix 75 mg daily x3 weeks followed by Plavix alone, this should be around the time of discharge 3. Pain Management: Tylenol as needed. Well controlled  -prn heat for muscle soreness 4. Mood: Provide emotional support             -antipsychotic agents: N/A 5. Neuropsych: This patient is capable of making decisions on his own behalf. 6. Skin/Wound Care: Routine skin checks. 7. Fluids/Electrolytes/Nutrition: Routine in and outs. 8.  Hypertension. Tenormin 50 mg daily, Cozaar 25 mg daily, increase to 50 Vitals:   03/07/20 0516 03/07/20 0723  BP: (!) 142/66 139/70  Pulse: 62 68  Resp: 16   Temp: 97.8 F (36.6 C)   SpO2: 96% 96%   Well controlled 10/31  9.  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  BPH with TURP.    Latest PVRs without retention 11.    Hyperlipidemia: Lipitor  12.  Hx GERD, used carafate at home may resume if  having daily sx  Improved 13.  Spasticity post CVA, flexor withdrawal type improved with klonopin qhs prn   LOS: 16 days A FACE TO FACE EVALUATION WAS PERFORMED  Meredith Staggers 03/07/2020, 9:34 AM

## 2020-03-07 NOTE — Progress Notes (Signed)
Physical Therapy Session Note  Patient Details  Name: Hector [name] MRN: [id number] Date of Birth: 02/21/1941  Today's Date: 03/07/2020 PT Individual Time: 1400-1500 PT Individual Time Calculation (min): 60 min   Short Term Goals:  Week 3:  PT Short Term Goal 1 (Week 3): STG = LTG due to ELOS      Skilled Therapeutic Interventions/Progress Updates:  Pt dozing in w/c; he awakened easily.  He denied pain.  neuromuscular re-education via demo, multimodal cues for 10 x 1 seated: bil hip adductor squeezes, R/L hip flexion, chin tucks, 20 x 1 bil scapular retraction.  Gait training iwht RW, level tile, wearing Walk On R AFO, x 150' with CGA.  As he fatigued, pt's R toes did not clear floor, but no LOB.  Gait with RW up/down 2 steps (platform plus box step) no rails, min assist, x 2.  Up/down 4 steps no rails or AD, with CGA to ascend, mod assist for balance x 2 when descending.  Floor transfer to floor mat, x 2; wearing RAFO: CGA> supervision, min cues> 0 cues.  Discussed fall safety , use of 911, and fall recovery.  Upon d/c, his wife will be with him, dtr will be at work, and son in law sometimes works from home. He does not feel that he will be home alone at his dtr's.    Gait to return to room, RW x 180' with CGA> mod assist (for LOB x 2) as his R foot did not clear floor.  Pt was unconcerned about his LOB, stating that once he gets to his dtr's, his endurance will build up as he won't spend so much time in bed or in w/c.   At end of session, pt resting in wc with seat pad alarm set and needs at hand.        Therapy Documentation Precautions:  Precautions Precautions: Fall Precaution Comments: R-sided weakness Restrictions Weight Bearing Restrictions: No    Therapy/Group: Individual Therapy  Yoltzin Barg 03/07/2020, 5:01 PM

## 2020-03-08 ENCOUNTER — Inpatient Hospital Stay (HOSPITAL_COMMUNITY): Payer: Medicare PPO | Admitting: Occupational Therapy

## 2020-03-08 ENCOUNTER — Inpatient Hospital Stay (HOSPITAL_COMMUNITY): Payer: Medicare PPO

## 2020-03-08 ENCOUNTER — Inpatient Hospital Stay (HOSPITAL_COMMUNITY): Payer: Medicare PPO | Admitting: Physical Therapy

## 2020-03-08 NOTE — Plan of Care (Signed)
  Problem: Consults Goal: RH STROKE PATIENT EDUCATION Description: See Patient Education module for education specifics  Outcome: Progressing   Problem: RH BOWEL ELIMINATION Goal: RH STG MANAGE BOWEL W/MEDICATION W/ASSISTANCE Description: STG Manage Bowel with Medication with mod I Assistance. Outcome: Progressing   Problem: RH BLADDER ELIMINATION Goal: RH STG MANAGE BLADDER WITH EQUIPMENT WITH ASSISTANCE Description: STG Manage Bladder With Equipment With mod I Assistance Outcome: Progressing   Problem: RH SKIN INTEGRITY Goal: RH STG MAINTAIN SKIN INTEGRITY WITH ASSISTANCE Description: STG Maintain Skin Integrity With mod I Assistance. Outcome: Progressing   Problem: RH SAFETY Goal: RH STG ADHERE TO SAFETY PRECAUTIONS W/ASSISTANCE/DEVICE Description: STG Adhere to Safety Precautions With cues and reminders. Outcome: Progressing Goal: RH STG DECREASED RISK OF FALL WITH ASSISTANCE Description: STG Decreased Risk of Fall With cues and reminders Outcome: Progressing   Problem: RH COGNITION-NURSING Goal: RH STG USES MEMORY AIDS/STRATEGIES W/ASSIST TO PROBLEM SOLVE Description: STG Uses Memory Aids/Strategies With reminder cues and reminders Assistance to Problem Solve. Outcome: Progressing Goal: RH STG ANTICIPATES NEEDS/CALLS FOR ASSIST W/ASSIST/CUES Description: STG Anticipates Needs/Calls for Assist With cues and reminders Outcome: Progressing   Problem: RH PAIN MANAGEMENT Goal: RH STG PAIN MANAGED AT OR BELOW PT'S PAIN GOAL Description: Pain level less than 4 on scale of 0-10 Outcome: Progressing   Problem: RH KNOWLEDGE DEFICIT Goal: RH STG INCREASE KNOWLEDGE OF HYPERTENSION Description: Pt will be able to demonstrate understanding of medication regimen, dietary and lifestyle modifications to better control hypertension and prevent stroke with mod I assist using handouts and booklet provided.  Outcome: Progressing Goal: RH STG INCREASE KNOWLEDGE OF STROKE  PROPHYLAXIS Description: Pt will be able to demonstrate understanding of medication regimen, dietary and lifestyle modifications to better control hypertension and prevent stroke with mod I assist using handouts and booklet provided.  Outcome: Progressing

## 2020-03-08 NOTE — Progress Notes (Signed)
[place] PHYSICAL MEDICINE & REHABILITATION PROGRESS NOTE   Subjective/Complaints:  Seen in PT doing steps with close supervision/CGA without handrail or AD  ROS: Patient denies fever, rash, sore throat, blurred vision, nausea, vomiting, diarrhea, cough, shortness of breath or chest pain,   headache, or mood change.    Objective:   No results found. No results for input(s): WBC, HGB, HCT, PLT in the last 72 hours. No results for input(s): NA, K, CL, CO2, GLUCOSE, BUN, CREATININE, CALCIUM in the last 72 hours.  Intake/Output Summary (Last 24 hours) at 03/08/2020 0846 Last data filed at 03/08/2020 0700 Gross per 24 hour  Intake 540 ml  Output 1475 ml  Net -935 ml        Physical Exam: Vital Signs Blood pressure 122/77, pulse (!) 57, temperature 98.1 F (36.7 C), resp. rate 16, height 5\' 11"  (1.803 m), SpO2 97 %.    General: No acute distress Mood and affect are appropriate Heart: Regular rate and rhythm no rubs murmurs or extra sounds Lungs: Clear to auscultation, breathing unlabored, no rales or wheezes Abdomen: Positive bowel sounds, soft nontender to palpation, nondistended Extremities: No clubbing, cyanosis, or edema Skin: No evidence of breakdown, no evidence of rash  Neuro: Alert Motor: LUE/LE: 5/5 proximal distal, stable RUE: 3- to 3/5 proximal distal RLE: Hip flexion, knee extension 4-/5, ankle dorsiflexion 2/5--stable exam  Assessment/Plan: 1. 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  Physiatrist is providing close team supervision and 24 hour management of active medical problems listed below.  Physiatrist and rehab team continue to assess barriers to discharge/monitor patient progress toward functional and medical goals  Care Tool:  Bathing  Bathing activity did not occur: Refused (Declined on this date, however agreeable to  bathing at next OT session) Body parts bathed by patient: Right arm, Left arm, Chest, Abdomen, Front perineal area, Buttocks, Right upper leg, Left upper leg, Left lower leg, Right lower leg, Face   Body parts bathed by helper: Left arm, Buttocks Body parts n/a: Left lower leg, Right lower leg, Left upper leg, Right upper leg, Abdomen, Chest, Left arm, Right arm   Bathing assist Assist Level: Contact Guard/Touching assist     Upper Body Dressing/Undressing Upper body dressing   What is the patient wearing?: Pull over shirt    Upper body assist Assist Level: Set up assist    Lower Body Dressing/Undressing Lower body dressing      What is the patient wearing?: Pants, Underwear/pull up     Lower body assist Assist for lower body dressing: Contact Guard/Touching assist     Toileting Toileting Toileting Activity did not occur (Clothing management and hygiene only): N/A (no void or bm)  Toileting assist Assist for toileting: Minimal Assistance - Patient > 75% Assistive Device Comment:  (urinal/stedy)   Transfers Chair/bed transfer  Transfers assist     Chair/bed transfer assist level: Contact Guard/Touching assist Chair/bed transfer assistive device: Programmer, multimedia   Ambulation assist      Assist level: Minimal Assistance - Patient > 75% Assistive device: [place]-rolling Max distance: 224ft   Walk 10 feet activity   Assist     Assist level: Minimal Assistance - Patient > 75% Assistive device: [place]-rolling   Walk 50 feet activity   Assist Walk 50 feet with 2 turns activity did not occur: Safety/medical concerns  Assist level: Minimal Assistance - Patient > 75%  Assistive device: [place]-rolling    Walk 150 feet activity   Assist Walk 150 feet activity did not occur: Safety/medical concerns  Assist level: Minimal Assistance - Patient > 75% Assistive device: [place]-rolling    Walk 10 feet on uneven surface  activity   Assist  Walk 10 feet on uneven surfaces activity did not occur: Safety/medical concerns         Wheelchair     Assist Will patient use wheelchair at discharge?: Yes (Per PT long term goals) Type of Wheelchair: Manual    Wheelchair assist level: Minimal Assistance - Patient > 75% Max wheelchair distance: 150    Wheelchair 50 feet with 2 turns activity    Assist        Assist Level: Minimal Assistance - Patient > 75%   Wheelchair 150 feet activity     Assist      Assist Level: Independent   Blood pressure 122/77, pulse (!) 57, temperature 98.1 F (36.7 C), resp. rate 16, height 5\' 11"  (1.803 m), SpO2 97 %.  Medical Problem List and Plan: 1.  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  Antithrombotics: -DVT/anticoagulation:               -antiplatelet therapy: Aspirin 81 mg daily and Plavix 75 mg daily x3 weeks followed by Plavix alone, this should be around the time of discharge 3. Pain Management: Tylenol as needed. Well controlled  -prn heat for muscle soreness 4. Mood: Provide emotional support             -antipsychotic agents: N/A 5. Neuropsych: This patient is capable of making decisions on his own behalf. 6. Skin/Wound Care: Routine skin checks. 7. Fluids/Electrolytes/Nutrition: Routine in and outs. 8.  Hypertension. Tenormin 50 mg daily, Cozaar 25 mg daily, increase to 50 Vitals:   03/07/20 1926 03/08/20 0456  BP: 125/66 122/77  Pulse: 66 (!) 57  Resp: 18 16  Temp: 98.3 F (36.8 C) 98.1 F (36.7 C)  SpO2: 96% 97%   Well controlled 11/1  9.  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  BPH with TURP.    Latest PVRs without retention 11.    Hyperlipidemia: Lipitor  12.  Hx GERD, used carafate at home may resume if having daily sx  Improved 13.  Spasticity post  CVA, flexor withdrawal type improved with klonopin qhs prn   LOS: 17 days A FACE TO FACE EVALUATION WAS PERFORMED  Charlett Blake 03/08/2020, 8:46 AM

## 2020-03-08 NOTE — Progress Notes (Signed)
Occupational Therapy Session Note  Patient Details  Name: Hector [name] MRN: [id number] Date of Birth: [birth year]  Today's Date: 03/08/2020 OT Individual Time: 1001-1100 OT Individual Time Calculation (min): 59 min    Short Term Goals: Week 3:  OT Short Term Goal 1 (Week 3): Continue working on established LTGs set at supervision level overall.  Skilled Therapeutic Interventions/Progress Updates:    [name] started session with transfer to the shower for bathing.  Min assist was needed for functional mobility in the room without an assistive device.  He completed undressing in standing with use of the grab bar for support and min assist.  He was then able to complete all bathing sit to stand with min guard and use of the RUE as an active assist with increased time for washing his head and washing some parts of his body.  He was able to perform dressing sit to stand at the sink with min guard as well.  He ambulated with the RW and close supervision down to the dayroom where he engaged in [facility] coordination task of putting 2' pegs in the foam board using the RUE, to match a given pattern.  Some difficulty noted with manipulation of pegs in hand to place them.  At times he had to bring the peg up against his body to adjust it.  Finished session with ambulation back to the room and pt staying up in his wheelchair with the call button and phone in reach.    Therapy Documentation Precautions:  Precautions Precautions: Fall Precaution Comments: R-sided weakness Restrictions Weight Bearing Restrictions: No  Pain: Pain Assessment Pain Scale: Faces Pain Score: 0-No pain ADL: See Care Tool Section for some details of mobility and selfcare  Therapy/Group: Individual Therapy  Eban Weick OTR/L 03/08/2020, 12:38 PM

## 2020-03-08 NOTE — Progress Notes (Signed)
Physical Therapy Session Note  Patient Details  Name: Hector [name] MRN: [id number] Date of Birth: 1941-04-25  Today's Date: 03/08/2020 PT Individual Time: 0800-0858 PT Individual Time Calculation (min): 58 min   Short Term Goals: Week 3:  PT Short Term Goal 1 (Week 3): STG = LTG due to ELOS  Skilled Therapeutic Interventions/Progress Updates:    Patient received supine in bed, agreeable to PT. He denies pain. Patient able to come to sitting edge of bed independently and don pants with supervision. Patient ambulating around hospital room with supervision and no AD to collect remainder of clothing. Supervision to don tshirt in standing. Patient ambulating ~215ft with RW and supervision. No evidence of R knee buckling. Poor foot control noted in placement, but no toe drag evident. Patient negotiating 4 steps with B HR use and supervision. Then no HR use with CGA x2. Patient completing BBS for balance assessment scoring a 39/56 indicating increased risk for falling. Discussed findings with patient and implications including, needing supervision (at least) for stair negotiation, picking items off the floor, reaching forward for items, turning in tight circles or tasks that require NBOS. PT emphasizing recommendation for further follow up therapy to further improve dynamic and static standing balance. Patient stating he'd" think about it." Patient completing NuStep x8 mins for further conditioning of B LE. He returned to room in wc, chair alarm on, call light within reach.   Therapy Documentation Precautions:  Precautions Precautions: Fall Precaution Comments: R-sided weakness Restrictions Weight Bearing Restrictions: No    Therapy/Group: Individual Therapy  Karoline Caldwell, PT, DPT, CBIS 03/08/2020, 7:29 AM

## 2020-03-08 NOTE — Progress Notes (Signed)
Physical Therapy Session Note  Patient Details  Name: Hector [name] MRN: [id number] Date of Birth: 18-Nov-1940  Today's Date: 03/08/2020 PT Individual Time: 1345-1510 PT Individual Time Calculation (min): 85 min   Short Term Goals: Week 3:  PT Short Term Goal 1 (Week 3): STG = LTG due to ELOS  Skilled Therapeutic Interventions/Progress Updates:    Pt received seated in w/c in room, agreeable to PT session. No complaints of pain. Dependent transport via w/c to/from therapy gym for time and energy conservation. Pt performs sit to stand with Supervision to RW throughout session. Ambulation up to 200 ft with RW at [facility] level, occasional catching of R foot during gait that increases with onset of fatigue. Pt exhibits decreased R lower extremity heel strike as well during gait. Sit to/from supine on real bed in therapy apartment independently to simulate home environment. Sit to stand from various height surfaces of furniture in therapy apartment with RW and Supervision. Standing mini-squats 2 x 15 reps with no UE support and CGA for balance for BLE strengthening. Sidesteps L/R with no AD and CGA to min A for balance, progressing to sidesteps with cone kicks with min A needed for balance. Static standing cone taps alt L/R with no UE support and min A for balance, 2 x 10 reps each bilaterally. Ambulation outdoors up/down incline and across uneven ground with RW, 3 x 150 ft with RW and min A for balance. Pt requires increased cues for safety when navigating outdoors and exhibits decreased control of R lower extremity at times. Pt requests to return to bed at end of session. Stand pivot transfer w/c to bed with CGA and on AD. Pt is independent for bed mobility. Pt left semi-reclined in bed with needs in reach, bed alarm in place at end of session.  Therapy Documentation Precautions:  Precautions Precautions: Fall Precaution Comments: R-sided weakness Restrictions Weight Bearing Restrictions:  No    Therapy/Group: Individual Therapy   Excell Seltzer, PT, DPT  03/08/2020, 3:19 PM

## 2020-03-09 ENCOUNTER — Inpatient Hospital Stay (HOSPITAL_COMMUNITY): Payer: Medicare PPO | Admitting: Physical Therapy

## 2020-03-09 ENCOUNTER — Inpatient Hospital Stay (HOSPITAL_COMMUNITY): Payer: Medicare PPO | Admitting: Occupational Therapy

## 2020-03-09 ENCOUNTER — Inpatient Hospital Stay (HOSPITAL_COMMUNITY): Payer: Medicare PPO

## 2020-03-09 MED ORDER — GABAPENTIN 100 MG PO CAPS
100.0000 mg | ORAL_CAPSULE | Freq: Every day | ORAL | Status: DC
  Administered 2020-03-09 – 2020-03-10 (×2): 100 mg via ORAL
  Filled 2020-03-09 (×2): qty 1

## 2020-03-09 NOTE — Progress Notes (Signed)
Patient ID: Hector [name], male   DOB: 1940-09-08, 79 y.o.   MRN: [id number]   SW followed up with patient PT. Will like to schedule family education for tomorrow.   SW followed up with pt dtr, no answer. Unable to leave voicemail, will follow up.

## 2020-03-09 NOTE — Progress Notes (Signed)
[facility] PHYSICAL MEDICINE & REHABILITATION PROGRESS NOTE   Subjective/Complaints:  Bilateral toe pain at noc, no injury yesterday , hx neuropathy, had problems at home at well   ROS: Patient denies CP, SOB N/V/D  Objective:   No results found. No results for input(s): WBC, HGB, HCT, PLT in the last 72 hours. No results for input(s): NA, K, CL, CO2, GLUCOSE, BUN, CREATININE, CALCIUM in the last 72 hours.  Intake/Output Summary (Last 24 hours) at 03/09/2020 0826 Last data filed at 03/09/2020 0501 Gross per 24 hour  Intake 354 ml  Output 775 ml  Net -421 ml        Physical Exam: Vital Signs Blood pressure 122/72, pulse 74, temperature (!) 97.5 F (36.4 C), temperature source Oral, resp. rate 17, height 5\' 11"  (1.803 m), SpO2 97 %.   General: No acute distress Mood and affect are appropriate Heart: Regular rate and rhythm no rubs murmurs or extra sounds Lungs: Clear to auscultation, breathing unlabored, no rales or wheezes Abdomen: Positive bowel sounds, soft nontender to palpation, nondistended Extremities: No clubbing, cyanosis, or edema Skin: No evidence of breakdown, no evidence of rash  Neuro: Alert Motor: LUE/LE: 5/5 proximal distal, stable RUE: 3- to 3/5 proximal distal RLE: Hip flexion, knee extension 4-/5, ankle dorsiflexion 2/5--stable exam  Assessment/Plan: 1. 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  Physiatrist is providing close team supervision and 24 hour management of active medical problems listed below.  Physiatrist and rehab team continue to assess barriers to discharge/monitor patient progress toward functional and medical goals  Care Tool:  Bathing  Bathing activity did not occur: Refused (Declined on this date, however agreeable to bathing at next OT session) Body parts bathed by patient: Right arm, Left arm, Chest,  Abdomen, Front perineal area, Buttocks, Right upper leg, Left upper leg, Left lower leg, Right lower leg, Face   Body parts bathed by helper: Left arm, Buttocks Body parts n/a: Left lower leg, Right lower leg, Left upper leg, Right upper leg, Abdomen, Chest, Left arm, Right arm   Bathing assist Assist Level: Contact Guard/Touching assist     Upper Body Dressing/Undressing Upper body dressing   What is the patient wearing?: Pull over shirt    Upper body assist Assist Level: Set up assist    Lower Body Dressing/Undressing Lower body dressing      What is the patient wearing?: Pants, Underwear/pull up     Lower body assist Assist for lower body dressing: Contact Guard/Touching assist     Toileting Toileting Toileting Activity did not occur (Clothing management and hygiene only): N/A (no void or bm)  Toileting assist Assist for toileting: Minimal Assistance - Patient > 75% Assistive Device Comment:  (urinal/stedy)   Transfers Chair/bed transfer  Transfers assist     Chair/bed transfer assist level: Contact Guard/Touching assist Chair/bed transfer assistive device: Museum/gallery exhibitions officer assist      Assist level: Contact Guard/Touching assist Assistive device: Wechter-rolling Max distance: 270ft   Walk 10 feet activity   Assist     Assist level: Contact Guard/Touching assist Assistive device: Stockley-rolling   Walk 50 feet activity   Assist Walk 50 feet with 2 turns activity did not occur: Safety/medical concerns  Assist level: Contact Guard/Touching assist Assistive device: Lehr-rolling    Walk 150 feet activity   Assist Walk 150 feet activity did not occur: Safety/medical concerns  Assist level: Contact Guard/Touching assist Assistive device: Sigg-rolling    Walk 10 feet on uneven surface  activity   Assist Walk 10 feet on uneven surfaces activity did not occur: Safety/medical concerns   Assist level: Minimal  Assistance - Patient > 75% Assistive device: Aeronautical engineer Will patient use wheelchair at discharge?: No Type of Wheelchair: Manual    Wheelchair assist level: Minimal Assistance - Patient > 75% Max wheelchair distance: 150    Wheelchair 50 feet with 2 turns activity    Assist        Assist Level: Minimal Assistance - Patient > 75%   Wheelchair 150 feet activity     Assist      Assist Level: Independent   Blood pressure 122/72, pulse 74, temperature (!) 97.5 F (36.4 C), temperature source Oral, resp. rate 17, height 5\' 11"  (1.803 m), SpO2 97 %.  Medical Problem List and Plan: 1.  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  Antithrombotics: -DVT/anticoagulation:               -antiplatelet therapy: Aspirin 81 mg daily and Plavix 75 mg daily x3 weeks followed by Plavix alone, this should be around the time of discharge 3. Pain Management: Tylenol as needed. Well controlled  -prn heat for muscle soreness Neuropathy pain add gabapentn dc klonopin 4. Mood: Provide emotional support             -antipsychotic agents: N/A 5. Neuropsych: This patient is capable of making decisions on his own behalf. 6. Skin/Wound Care: Routine skin checks. 7. Fluids/Electrolytes/Nutrition: Routine in and outs. 8.  Hypertension. Tenormin 50 mg daily, Cozaar 25 mg daily, increase to 50 Vitals:   03/09/20 0500 03/09/20 0742  BP: 137/82 122/72  Pulse: 63 74  Resp: 17   Temp: (!) 97.5 F (36.4 C)   SpO2: 97% 97%   Well controlled 11/2  9.  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  BPH with TURP.    Latest PVRs without retention 11.    Hyperlipidemia: Lipitor  12.  Hx GERD, used carafate at home may resume if having daily sx  Improved 13.   Spasticity post CVA, flexor withdrawal type improved with klonopin qhs prn   LOS: 18 days A FACE TO FACE EVALUATION WAS PERFORMED  Charlett Blake 03/09/2020, 8:26 AM

## 2020-03-09 NOTE — Progress Notes (Signed)
Patient ID: Hector [name], male   DOB: Jan 12, 1941, 79 y.o.   MRN: [id number]  Family education scheduled for Wednesday, Nov. 3 1-3PM

## 2020-03-09 NOTE — Progress Notes (Signed)
Occupational Therapy Session Note  Patient Details  Name: Hector [name] MRN: [id number] Date of Birth: July 08, 1940  Today's Date: 03/09/2020 OT Individual Time: 2671-2458 OT Individual Time Calculation (min): 60 min    Short Term Goals: Week 3:  OT Short Term Goal 1 (Week 3): Continue working on established LTGs set at supervision level overall.  Skilled Therapeutic Interventions/Progress Updates:    Session 1: [phone]) Pt ambulated down to the therapy gym with min guard assist using the RW for support.  Occasional decreased clearing of the RLE noted with increased knee flexion as well in stance phase.  Once in the gym had him work on RUE weightbearing in quadriped while washing the mat with the LUE and supporting himself with the right.  Had him work on holding the RUE in elbow extension while picking up and placing clothespins with the LUE as well.  He transitioned to supine for work on bilateral shoulder flexion while holding therapy ball as well as maintaining the RUE on elbow extension while holding the arm up and completing air alphabet.  He was only able to complete half of the letters before needing a break secondary to fatigue.  Three sets of these exercises were performed.  Next, had him transition to sitting where he worked on trying to pick up small round 2" pegs and place in a peg board.  Increased difficulty noted with trying to manipulate him in his hand to position them with 50% drops noted.  Returned to the room at the end of the session with use of the [place]rad.  Call button and phone in reach with safety alarm pad in place.    Session 2: ([id number]) Pt completed functional mobility down to the dayroom with supervision using the RW.  Had him work on standing balance and RUE functional use with [employer] activity.  He was able to stand and toss the bean bags with the RUE while having to reach down to pick them up from the stool as well as the floor and the board after tossing them.   Min assist needed for mobility without an assistive device to gather them up after tossing them. He needed rest breaks of 3-4 mins after each interval secondary to fatigue.   Finished session with transfer back to the room with use of the Dawn and close supervision.  He was left with the call button in reach as well as the phone, sitting up in the chair.    Therapy Documentation Precautions:  Precautions Precautions: Fall Precaution Comments: R-sided weakness Restrictions Weight Bearing Restrictions: No  Pain: Pain Assessment Pain Scale: Faces Pain Score: 0-No pain ADL: See Care Tool Section for some details of mobility  Therapy/Group: Individual Therapy  Tikia Skilton OTR/L 03/09/2020, 12:29 PM

## 2020-03-09 NOTE — Progress Notes (Signed)
Patient ID: Hector [name], male   DOB: 10-12-1940, 79 y.o.   MRN: [id number]  Rolling Pileggi ordered through [facility]

## 2020-03-09 NOTE — Discharge Summary (Signed)
Physician Discharge Summary  Patient ID: Hector [name] MRN: [id number] DOB/AGE: February 11, 1941 79 y.o.  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 Left facial numbness and dizziness. EXAM: CT ANGIOGRAPHY HEAD AND NECK TECHNIQUE: Multidetector CT imaging of the head and neck was performed using the standard protocol during bolus administration of intravenous contrast. Multiplanar CT image reconstructions and MIPs were obtained to evaluate the vascular anatomy. Carotid stenosis measurements (when applicable) are obtained utilizing NASCET criteria, using the distal internal carotid diameter as the denominator. CONTRAST:  18mL OMNIPAQUE IOHEXOL 350 MG/ML SOLN COMPARISON:  None. FINDINGS: CT HEAD FINDINGS Brain: There is no mass, hemorrhage or extra-axial collection. There is generalized atrophy without lobar predilection. There is hypoattenuation of the periventricular white matter, most commonly indicating chronic ischemic microangiopathy. Skull: The visualized skull base, calvarium and extracranial soft tissues are normal. Sinuses/Orbits: No fluid levels or advanced mucosal thickening of the visualized paranasal sinuses. No mastoid or middle ear effusion. The orbits are normal. CTA NECK FINDINGS SKELETON: There is no bony spinal canal stenosis. No lytic or blastic lesion. OTHER NECK: Normal pharynx, larynx and major salivary glands. No cervical lymphadenopathy. Unremarkable thyroid gland. UPPER CHEST: No pneumothorax or pleural effusion. No nodules or masses. AORTIC ARCH: There is calcific  atherosclerosis of the aortic arch. There is no aneurysm, dissection or hemodynamically significant stenosis of the visualized portion of the aorta. Conventional 3 vessel aortic branching pattern. The visualized proximal subclavian arteries are widely patent. RIGHT CAROTID SYSTEM: Normal without aneurysm, dissection or stenosis. LEFT CAROTID SYSTEM: Normal without aneurysm, dissection or stenosis. VERTEBRAL ARTERIES: Left dominant configuration. Both origins are clearly patent. There is no dissection, occlusion or flow-limiting stenosis to the skull base (V1-V3 segments). CTA HEAD FINDINGS POSTERIOR CIRCULATION: --Vertebral arteries: Normal V4 segments. --Inferior cerebellar arteries: Normal. --Basilar artery: Normal. --Superior cerebellar arteries: Normal. --Posterior cerebral arteries (PCA): Normal. ANTERIOR CIRCULATION: --Intracranial internal carotid arteries: Atherosclerotic calcification of the internal carotid arteries at the skull base without hemodynamically significant stenosis. --Anterior cerebral arteries (ACA): Normal. Both A1 segments are present. Patent anterior communicating artery (a-comm). --Middle cerebral arteries (MCA): Normal. VENOUS SINUSES: As permitted by contrast timing, patent. ANATOMIC VARIANTS: Fetal origin of the right posterior cerebral artery. Review of the MIP images confirms the above findings. IMPRESSION: 1. No emergent large vessel occlusion or hemodynamically significant stenosis of the head or neck. 2. Chronic ischemic microangiopathy and generalized atrophy. Aortic Atherosclerosis (ICD10-I70.0). Electronically Signed   By: Ulyses Jarred M.D.   On: 02/17/2020 03:48   CT Head Wo Contrast  Result Date: 02/16/2020 CLINICAL DATA:  Left facial burning and gait instability, slurred speech noted at 1200 hours EXAM: CT HEAD WITHOUT CONTRAST TECHNIQUE: Contiguous axial images were obtained from the base of the skull through the vertex without intravenous contrast. COMPARISON:  None.  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 No large CT evident area large vascular territory or cortically based infarction. No evidence of hemorrhage, hydrocephalus, extra-axial collection, visible mass lesion or mass effect. Symmetric prominence of the ventricles, cisterns and sulci compatible  with parenchymal volume loss. Patchy areas of white matter hypoattenuation are most compatible with chronic microvascular angiopathy. Vascular: Atherosclerotic calcification of the carotid siphons and intradural vertebral arteries. No hyperdense vessel. Skull: No calvarial fracture or suspicious osseous lesion. No scalp swelling or hematoma. Sinuses/Orbits: Mild thickening in the paranasal sinuses, left greater than right. No air-fluid levels are pneumatized secretions. Mastoid air cells are predominantly clear with pneumatization of the petrous apices. Included orbital structures are unremarkable. Other: None. IMPRESSION: 1. 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 2. No large CT evident area of large vascular territory or cortically based infarction or other acute intracranial abnormality. If there is persisting concern for acute infarction, MRI is more sensitive and specific for early features of ischemia. 3. Parenchymal volume loss and chronic microvascular angiopathy. 4. Intracranial atherosclerosis. Electronically Signed   By: Lovena Le M.D.   On: 02/16/2020 15:33   CT Angio Neck W and/or Wo Contrast  Result Date: 02/17/2020 CLINICAL DATA:  Stroke-like symptoms. Left facial numbness and dizziness. EXAM: CT ANGIOGRAPHY HEAD AND NECK TECHNIQUE: Multidetector CT imaging of the head and neck was performed using the standard  protocol during bolus administration of intravenous contrast. Multiplanar CT image reconstructions and MIPs were obtained to evaluate the vascular anatomy. Carotid stenosis measurements (when applicable) are obtained utilizing NASCET criteria, using the distal internal carotid diameter as the denominator. CONTRAST:  188mL OMNIPAQUE IOHEXOL 350 MG/ML SOLN COMPARISON:  None. FINDINGS: CT HEAD FINDINGS Brain: There is no mass, hemorrhage or extra-axial collection. There is generalized atrophy without lobar predilection. There is hypoattenuation of the periventricular white matter, most commonly indicating chronic ischemic microangiopathy. Skull: The visualized skull base, calvarium and extracranial soft tissues are normal. Sinuses/Orbits: No fluid levels or advanced mucosal thickening of the visualized paranasal sinuses. No mastoid or middle ear effusion. The orbits are normal. CTA NECK FINDINGS SKELETON: There is no bony spinal canal stenosis. No lytic or blastic lesion. OTHER NECK: Normal pharynx, larynx and major salivary glands. No cervical lymphadenopathy. Unremarkable thyroid gland. UPPER CHEST: No pneumothorax or pleural effusion. No nodules or masses. AORTIC ARCH: There is calcific atherosclerosis of the aortic arch. There is no aneurysm, dissection or hemodynamically significant stenosis of the visualized portion of the aorta. Conventional 3 vessel aortic branching pattern. The visualized proximal subclavian arteries are widely patent. RIGHT CAROTID SYSTEM: Normal without aneurysm, dissection or stenosis. LEFT CAROTID SYSTEM: Normal without aneurysm, dissection or stenosis. VERTEBRAL ARTERIES: Left dominant configuration. Both origins are clearly patent. There is no dissection, occlusion or flow-limiting stenosis to the skull base (V1-V3 segments). CTA HEAD FINDINGS POSTERIOR CIRCULATION: --Vertebral arteries: Normal V4 segments. --Inferior cerebellar arteries: Normal. --Basilar artery: Normal. --Superior  cerebellar arteries: Normal. --Posterior cerebral arteries (PCA): Normal. ANTERIOR CIRCULATION: --Intracranial internal carotid arteries: Atherosclerotic calcification of the internal carotid arteries at the skull base without hemodynamically significant stenosis. --Anterior cerebral arteries (ACA): Normal. Both A1 segments are present. Patent anterior communicating artery (a-comm). --Middle cerebral arteries (MCA): Normal. VENOUS SINUSES: As permitted by contrast timing, patent. ANATOMIC VARIANTS: Fetal origin of the right posterior cerebral artery. Review of the MIP images confirms the above findings. IMPRESSION: 1. No emergent large vessel occlusion or hemodynamically significant stenosis of the head or neck. 2. Chronic ischemic microangiopathy and generalized atrophy. Aortic Atherosclerosis (ICD10-I70.0). Electronically Signed   By: Ulyses Jarred M.D.   On: 02/17/2020 03:48   MR BRAIN WO CONTRAST  Result  Date: 02/17/2020 CLINICAL DATA:  Right-sided weakness EXAM: MRI HEAD WITHOUT CONTRAST TECHNIQUE: Multiplanar, multiecho pulse sequences of the brain and surrounding structures were obtained without intravenous contrast. COMPARISON:  None. 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 No acute hemorrhage. Multifocal hyperintense T2-weighted signal within the white matter. There is generalized atrophy without lobar predilection. No chronic microhemorrhage. Normal midline structures. Vascular: Normal flow voids. Skull and upper cervical spine: Normal marrow signal. Sinuses/Orbits: Negative. Other: None. 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 No hemorrhage or mass effect. Electronically Signed   By: Ulyses Jarred M.D.   On: 02/17/2020 19:08   DG Chest Portable 1 View  Result Date: 02/16/2020 CLINICAL DATA:  Dizziness EXAM: PORTABLE CHEST 1 VIEW COMPARISON:  None. FINDINGS: The heart size and mediastinal contours are within normal limits. Both lungs are  clear. The visualized skeletal structures are unremarkable. IMPRESSION: No active disease. Electronically Signed   By: Inez Catalina M.D.   On: 02/16/2020 15:45   DG Swallowing Func-Speech Pathology  Result Date: 02/18/2020 Objective Swallowing Evaluation: Type of Study: MBS-Modified Barium Swallow Study  Patient Details Name: OLAMIDE CARATTINI MRN: [id number] Date of Birth: [birth year] Today's Date: 02/18/2020 Time: SLP Start Time (ACUTE ONLY): 1106 -SLP Stop Time (ACUTE ONLY): 1113 SLP Time Calculation (min) (ACUTE ONLY): 7 min Past Medical History: Past Medical History: Diagnosis Date . Diverticulitis 04/2012  admission [facility] . Former smoker   Smoked for 20 yrs;  Quit  25 yrs ago. Marland Kitchen GERD (gastroesophageal reflux disease)  . Hypertension  . Hypokalemia  . Lung cancer ([place])  . Personal history of colonic adenoma 04/2009 . Polycythemia  . Prostatitis   S/P  TURP Past Surgical History: Past Surgical History: Procedure Laterality Date . APPENDECTOMY  02/25/11 . COLONOSCOPY   . LOBECTOMY   . TRANSURETHRAL RESECTION OF PROSTATE  2004 HPI: 79 yo male adm to [facility] with right sided weakness, also has polycythemia, lung cancer hx s/p VATS and lobectomy, colonic adenoma, insomnia, HTN.  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  Speech eval ordered.  Subjective: Pt awake, alert, pleasant, participative.  Daughter present for evaluation Assessment / Plan / Recommendation CHL IP CLINICAL IMPRESSIONS 02/18/2020 Clinical Impression Pt presents with grossly normal swallow function.  There was no penetration or aspiration with any consistencies trialed.  Oral phase was efficient and well controlled.  Swallow initiation was timely.  Recommend regular texture diet with thin liquids. No further ST needs for dysphagia.  SLP will sign off for swallowing. SLP Visit Diagnosis Dysphagia, unspecified (R13.10) Attention and concentration deficit following -- Frontal lobe and  executive function deficit following -- Impact on safety and function No limitations   CHL IP TREATMENT RECOMMENDATION 02/18/2020 Treatment Recommendations No treatment recommended at this time   No flowsheet data found. CHL IP DIET RECOMMENDATION 02/18/2020 SLP Diet Recommendations Regular solids;Thin liquid Liquid Administration via Cup;Straw Medication Administration Whole meds with liquid Compensations Slow rate;Small sips/bites Postural Changes Seated upright at 90 degrees   CHL IP OTHER RECOMMENDATIONS 02/18/2020 Recommended Consults -- Oral Care Recommendations Oral care BID Other Recommendations --   CHL IP FOLLOW UP RECOMMENDATIONS 02/18/2020 Follow up Recommendations None   CHL IP FREQUENCY AND DURATION 02/18/2020 Speech Therapy Frequency (ACUTE ONLY) (No Data) Treatment Duration --      CHL IP ORAL PHASE 02/18/2020 Oral Phase WFL Oral - Pudding Teaspoon -- Oral - Pudding Cup --  Oral - Honey Teaspoon -- Oral - Honey Cup -- Oral - Nectar Teaspoon -- Oral - Nectar Cup -- Oral - Nectar Straw -- Oral - Thin Teaspoon -- Oral - Thin Cup WFL Oral - Thin Straw WFL Oral - Puree WFL Oral - Mech Soft -- Oral - Regular WFL Oral - Multi-Consistency -- Oral - Pill WFL Oral Phase - Comment --  CHL IP PHARYNGEAL PHASE 02/18/2020 Pharyngeal Phase WFL Pharyngeal- Pudding Teaspoon -- Pharyngeal -- Pharyngeal- Pudding Cup -- Pharyngeal -- Pharyngeal- Honey Teaspoon -- Pharyngeal -- Pharyngeal- Honey Cup -- Pharyngeal -- Pharyngeal- Nectar Teaspoon -- Pharyngeal -- Pharyngeal- Nectar Cup -- Pharyngeal -- Pharyngeal- Nectar Straw -- Pharyngeal -- Pharyngeal- Thin Teaspoon -- Pharyngeal -- Pharyngeal- Thin Cup [facility] Pharyngeal Material does not enter airway Pharyngeal- Thin Straw WFL Pharyngeal Material does not enter airway Pharyngeal- Puree WFL Pharyngeal Material does not enter airway Pharyngeal- Mechanical Soft -- Pharyngeal -- Pharyngeal- Regular WFL Pharyngeal Material does not enter airway Pharyngeal- Multi-consistency --  Pharyngeal -- Pharyngeal- Pill WFL Pharyngeal Material does not enter airway Pharyngeal Comment --  CHL IP CERVICAL ESOPHAGEAL PHASE 02/18/2020 Cervical Esophageal Phase WFL Pudding Teaspoon -- Pudding Cup -- Honey Teaspoon -- Honey Cup -- Nectar Teaspoon -- Nectar Cup -- Nectar Straw -- Thin Teaspoon -- Thin Cup -- Thin Straw -- Puree -- Mechanical Soft -- Regular -- Multi-consistency -- Pill -- Cervical Esophageal Comment -- Celedonio Savage, MA, CCC-SLP Acute Rehabilitation Services Office: [phone] 02/18/2020, 12:09 PM              ECHOCARDIOGRAM COMPLETE  Result Date: 02/17/2020    ECHOCARDIOGRAM REPORT   Patient Name:   SHAVON ZENZ Date of Exam: 02/17/2020 Medical Rec #:  [id number]      Height:       71.0 in Accession #:    [id number]     Weight:       200.0 lb Date of Birth:  March 16, 1941      BSA:          2.109 m Patient Age:    64 years       BP:           137/89 mmHg Patient Gender: M              HR:           68 bpm. Exam Location:  Inpatient Procedure: 2D Echo Indications:    right sided weakness  History:        Patient has no prior history of Echocardiogram examinations.                 Risk Factors:Former Smoker. Lung cancer.  Sonographer:    Jannett Celestine RDCS (AE) Referring Phys: [place]  Sonographer Comments: limited mobility due to right sided weakness. no true parasternal window IMPRESSIONS  1. Left ventricular ejection fraction, by estimation, is 60 to 65%. The left ventricle has normal function. The left ventricle has no regional wall motion abnormalities. Left ventricular diastolic parameters were normal.  2. Right ventricular systolic function is normal. The right ventricular size is normal.  3. The mitral valve is normal in structure. No evidence of mitral valve regurgitation. No evidence of mitral stenosis.  4. The aortic valve is normal in structure. Aortic valve regurgitation is not visualized. No aortic stenosis is present.  5. The inferior vena cava is normal in size  with greater than 50% respiratory variability, suggesting right atrial pressure of 3 mmHg. FINDINGS  Left Ventricle: Left ventricular ejection fraction, by estimation, is 60 to 65%. The left ventricle has normal function. The left ventricle has no regional wall motion abnormalities. The left ventricular internal cavity size was normal in size. There is  no left ventricular hypertrophy. Left ventricular diastolic parameters were normal. Normal left ventricular filling pressure. Right Ventricle: The right ventricular size is normal. No increase in right ventricular wall thickness. Right ventricular systolic function is normal. Left Atrium: Left atrial size was normal in size. Right Atrium: Right atrial size was normal in size. Pericardium: There is no evidence of pericardial effusion. Mitral Valve: The mitral valve is normal in structure. No evidence of mitral valve regurgitation. No evidence of mitral valve stenosis. Tricuspid Valve: The tricuspid valve is normal in structure. Tricuspid valve regurgitation is not demonstrated. No evidence of tricuspid stenosis. Aortic Valve: The aortic valve is normal in structure. Aortic valve regurgitation is not visualized. No aortic stenosis is present. Pulmonic Valve: The pulmonic valve was not well visualized. Pulmonic valve regurgitation is not visualized. No evidence of pulmonic stenosis. Aorta: The aortic root is normal in size and structure. Venous: The inferior vena cava is normal in size with greater than 50% respiratory variability, suggesting right atrial pressure of 3 mmHg. IAS/Shunts: No atrial level shunt detected by color flow Doppler.  LEFT VENTRICLE PLAX 2D LVIDd:         3.50 cm  Diastology LVIDs:         2.20 cm  LV e' medial:    6.64 cm/s LV PW:         0.80 cm  LV E/e' medial:  8.6 LV IVS:        0.90 cm  LV e' lateral:   9.46 cm/s LVOT diam:     2.40 cm  LV E/e' lateral: 6.1 LV SV:         76 LV SV Index:   36 LVOT Area:     4.52 cm  RIGHT VENTRICLE RV S  prime:     10.60 cm/s TAPSE (M-mode): 1.6 cm LEFT ATRIUM         Index LA diam:    3.90 cm 1.85 cm/m  AORTIC VALVE LVOT Vmax:   82.90 cm/s LVOT Vmean:  63.100 cm/s LVOT VTI:    0.168 m  AORTA Ao Root diam: 3.10 cm MITRAL VALVE MV Area (PHT): 2.97 cm    SHUNTS MV Decel Time: 255 msec    Systemic VTI:  0.17 m MV E velocity: 57.40 cm/s  Systemic Diam: 2.40 cm MV A velocity: 58.30 cm/s MV E/A ratio:  0.98 Mihai Croitoru MD Electronically signed by Sanda Klein MD Signature Date/Time: 02/17/2020/2:45:59 PM    Final     Labs:  Basic Metabolic Panel: No results for input(s): NA, K, CL, CO2, GLUCOSE, BUN, CREATININE, CALCIUM, MG, PHOS in the last 168 hours.  CBC: No results for input(s): WBC, NEUTROABS, HGB, HCT, MCV, PLT in the last 168 hours.  CBG: No results for input(s): GLUCAP in the last 168 hours.  Family history.  Mother with CVA Father with CAD paternal uncle with carcinoma of unknown origin.  Negative colon cancer esophageal cancer rectal cancer  Brief HPI:   Hector [name] is a 79 y.o. 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  Lives with spouse in [place] had been staying in the area with daughter after wife recuperating from hysterectomy.  Independent prior to admission 1 level home 2-3  steps to entry.  Presented 02/16/2020 with acute right-sided weakness and dysarthria.  Cranial CT scan showed few scattered hypoattenuating foci in the basal ganglia and left external capsule.  CT angiogram of head and neck no emergent large vessel occlusion.  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  No hemorrhage or mass-effect.  Echocardiogram with ejection fraction of 60 to 65% no wall motion abnormalities.  Admission chemistries hemoglobin 17.5 glucose 152 urinalysis negative nitrite hemoglobin A1c 6.2 TSH 1.44.  Maintained on Plavix and aspirin x3 weeks followed by Plavix alone.    Tolerating a regular diet.  Therapy  evaluations completed and patient was admitted for a comprehensive rehab program.   Hospital Course: HAEDEN HUDOCK was admitted to rehab 02/20/2020 for inpatient therapies to consist of PT, ST and OT at least three hours five days a week. Past admission physiatrist, therapy team and rehab RN have worked together to provide customized collaborative inpatient rehab.  Pertaining to patient's small left paramedian pontine infarct remaining stable maintained on aspirin and Plavix x3 weeks followed by Plavix alone patient would follow neurology services.  He did have some spasticity related to CVA improved with Klonopin bedtime as needed.  Blood pressure controlled on Tenormin 50 mg daily and Cozaar 50 mg daily.  Patient would need follow-up with primary MD.  Lipitor ongoing for hyperlipidemia.  Noted history of BPH with TURP patient voiding without difficulty.   Blood pressures were monitored on TID basis and controlled      Rehab course: During patient's stay in rehab weekly team conferences were held to monitor patient's progress, set goals and discuss barriers to discharge. At admission, patient required max assist sit to supine total assist sit to stand.  Mod max assist ADLs  Physical exam.  Blood pressure 128/70 pulse 80 temperature 98 respirations 18 oxygen saturations 92% room air Constitutional.  No acute distress HEENT Head.  Normocephalic and atraumatic Neck.  Supple nontender no JVD without thyromegaly Cardiac regular rate rhythm without extra sounds or murmur heard Abdomen.  Soft nontender positive bowel sounds without rebound Respiratory effort normal no respiratory distress without wheeze Extremities.  No clubbing cyanosis or edema Neurologic alert cognitively appropriate fair insight and awareness of deficits.  Cranial nerves II through XII intact.  Left upper extremity 2/5 EE, EF, 3/5W EE, 0/5 handgrip Right lower extremity 4 -/5 throughout with 3/5 dorsi plantar flexion  He/   has had improvement in activity tolerance, balance, postural control as well as ability to compensate for deficits. He/ has had improvement in functional use RUE/LUE  and RLE/LLE as well as improvement in awareness.  Perform sit to stand with supervision rolling Menge ambulates 200 feet rolling Parmer contact-guard assist.  Sit to from supine on real bed in therapy apartment independent to simulate home environment.  Ambulates outdoors up-and-down incline across uneven ground rolling Mcclean minimal assist.  Minimal assist was needed for functional mobility in his room without assistive device for ADLs.  He was able to complete all bathing sit to stand with minimal guard and use of right upper extremity as well as active assist with increased time for washing his head washing some parts of his body.  Full family teaching was completed and discharged to home       Disposition: Discharge to home    Diet: Regular  Special Instructions: No driving smoking or alcohol  Medications at discharge 1.  Tylenol as needed 2.  Tenormin 50 mg p.o. daily 3.  Lipitor 20 mg p.o. daily 4.  Klonopin 0.25 mg nightly as needed spasms 5.  Plavix 75 mg p.o. daily 6.  Cozaar 50 mg p.o. daily 7.  Carafate 1 g every morning and evening 8.  Vitamin B12 1000 mcg p.o. daily  30-35 minutes were spent completing discharge summary and discharge planning  Discharge Instructions    Ambulatory referral to Neurology   Complete by: As directed    An appointment is requested in approximately 4 weeks left pontine infarction   Ambulatory referral to Physical Medicine Rehab   Complete by: As directed    Moderate complexity follow-up 1 to 2 weeks left pontine infarction       Follow-up Information    Kirsteins, Luanna Salk, MD Follow up.   Specialty: Physical Medicine and Rehabilitation Why: Office to call for appointment Contact information: [address] [place] [id number] [phone]                Signed: Cathlyn Parsons 03/11/2020, 5:21 AM

## 2020-03-09 NOTE — Progress Notes (Signed)
Patient ID: Hector [name], male   DOB: Mar 21, 1941, 79 y.o.   MRN: [id number]  PCP established at [facility] with Dr. Otilio Miu. New patient appointment scheduled for December 13th at 2:00PM.

## 2020-03-09 NOTE — Progress Notes (Signed)
Physical Therapy Session Note  Patient Details  Name: LEANTHONY RHETT MRN: [id number] Date of Birth: [birth year]  Today's Date: 03/09/2020 PT Individual Time: 0800-0858 PT Individual Time Calculation (min): 58 min   Short Term Goals: Week 3:  PT Short Term Goal 1 (Week 3): STG = LTG due to ELOS  Skilled Therapeutic Interventions/Progress Updates:    Patient received supine in bed, agreeable to PT. He reports mild pain in R distal digits, but relates this to neuropathy and a crease in his sock. PT assisting patient with adjusting socks to minimize pain. He was able to ambulate around his room to collect clothing to change into. Supervision for transfer to toilet and lower body dressing including shoes and socks. Patient propelling himself in wc using B LE to therapy gym with supervision. Trialed Foot-up brace to assist with R toe clearance during gait. Moderate improvement noted with toe clearance, however, he demonstrated worsened heel whip with brace. R knee stability continues to improve. Patient negotiating 4steps with B HR use and supervision. 4 steps with no HR use and CGA. Patient ambulating back to room with RW and supervision, returning to chair in room. Chair alarm on, call light within reach.   Therapy Documentation Precautions:  Precautions Precautions: Fall Precaution Comments: R-sided weakness Restrictions Weight Bearing Restrictions: No    Therapy/Group: Individual Therapy  Karoline Caldwell, PT, DPT, CBIS 03/09/2020, 7:37 AM

## 2020-03-09 NOTE — Progress Notes (Signed)
Physical Therapy Session Note  Patient Details  Name: Hector [name] MRN: [id number] Date of Birth: 11-26-1940  Today's Date: 03/09/2020 PT Individual Time: 2482-5003 PT Individual Time Calculation (min): 47 min   Short Term Goals: Week 3:  PT Short Term Goal 1 (Week 3): STG = LTG due to ELOS  Skilled Therapeutic Interventions/Progress Updates:    Pt received sitting in w/c and agreeable to therapy session. Pt denies any questions/concerns regarding upcoming D/C and reports that he feels prepared to perform functional mobility tasks in the home. Sit<>stands using RW with supervision during session. Gait ~171ft to/from gym using RW with supervision and intermittent CGA - occasional R toe-catching during swing causing slight instability; significant improvement of knee extension during stance since last time this therapist saw patient; continues to have R heel whip with poor eccentric contraction of hamstrings to slow forward movement of the lower leg during swing - pt reports he doesn't feel the need for R hand orthotic on RW, trialed without it during session and pt able to sustain grasp (has Coban on grip) but discussed possible need for longer distance or on more uneven terrain when requiring more control over RW. Therapist provided pt with RW bag and educated on purpose and importance of use to maintain B hand grasp on AD at all times during ambulation.  Performed the following exercises and provided pt with printout for HEP:   - Sit to Stand with Arms Crossed - 1 x daily - 7 x weekly - 2 sets - 10 reps - Heel rises with counter support - 1 x daily - 7 x weekly - 2 sets - 10 reps - Seated Ankle Plantar Flexion with Resistance Loop - 1 x daily - 7 x weekly - 2 sets - 12 reps - Supine Single Leg Ankle Pumps - 1 x daily - 7 x weekly - 2 sets - 10 reps - Side Stepping with Counter Support - 1 x daily - 7 x weekly - 3 sets - 10 reps - Backward Walking with Counter Support - 1 x daily - 7 x weekly -  3 sets - 10 reps  Cuing throughout for proper form/technique and education (with written instructions provided) on having family provide proper assistance to maintain safety during the exercises. Pt continues to lack significant ankle DF strength. Gait ~173ft back to room using RW with same assistance as above and same gait deviations. Pt left seated in w/c with needs in reach and chair alarm on.   Therapy Documentation Precautions:  Precautions Precautions: Fall Precaution Comments: R-sided weakness Restrictions Weight Bearing Restrictions: No  Pain:   No reports of pain throughout session.   Therapy/Group: Individual Therapy  Tawana Scale , PT, DPT, CSRS  03/09/2020, 4:14 PM

## 2020-03-10 ENCOUNTER — Inpatient Hospital Stay (HOSPITAL_COMMUNITY): Payer: Medicare PPO

## 2020-03-10 ENCOUNTER — Ambulatory Visit (HOSPITAL_COMMUNITY): Payer: Medicare PPO

## 2020-03-10 ENCOUNTER — Inpatient Hospital Stay (HOSPITAL_COMMUNITY): Payer: Medicare PPO | Admitting: Physical Therapy

## 2020-03-10 ENCOUNTER — Encounter (HOSPITAL_COMMUNITY): Payer: Medicare PPO | Admitting: Occupational Therapy

## 2020-03-10 MED ORDER — SUCRALFATE 1 G PO TABS: 1.0000 g | ORAL_TABLET | Freq: Two times a day (BID) | ORAL | 0 refills | Status: AC

## 2020-03-10 MED ORDER — CLOPIDOGREL BISULFATE 75 MG PO TABS
75.0000 mg | ORAL_TABLET | Freq: Every day | ORAL | 0 refills | Status: AC
Start: 2020-03-10 — End: 2020-04-09

## 2020-03-10 MED ORDER — LOSARTAN POTASSIUM 50 MG PO TABS: 50.0000 mg | ORAL_TABLET | Freq: Every day | ORAL | 0 refills | Status: AC

## 2020-03-10 MED ORDER — VITAMIN B-12 1000 MCG PO TABS: 1000.0000 ug | ORAL_TABLET | Freq: Every day | ORAL | 0 refills | Status: AC

## 2020-03-10 MED ORDER — ATORVASTATIN CALCIUM 20 MG PO TABS
20.0000 mg | ORAL_TABLET | Freq: Every day | ORAL | 0 refills | Status: AC
Start: 2020-03-10 — End: 2020-04-09

## 2020-03-10 MED ORDER — ATENOLOL 50 MG PO TABS: ORAL_TABLET | ORAL | 1 refills | Status: AC

## 2020-03-10 MED ORDER — GABAPENTIN 100 MG PO CAPS: 100.0000 mg | ORAL_CAPSULE | Freq: Every day | ORAL | 0 refills | Status: DC

## 2020-03-10 NOTE — Patient Care Conference (Signed)
Inpatient RehabilitationTeam Conference and Plan of Care Update Date: 03/10/2020   Time: 10:52 AM    Patient Name: Hector [name]      Medical Record Number: [id number]  Date of Birth: 08-11-1940 Sex: Male         Room/Bed: 4W05C/4W05C-01 Payor Info: Payor: [employer] MEDICARE / Plan: [employer] MEDICARE CHOICE PPO / Product Type: *No Product type* /    Admit Date/Time:  02/20/2020  3:05 PM  Primary Diagnosis:  Left pontine cerebrovascular accident [facility])  Hospital Problems: Principal Problem:   Left pontine cerebrovascular accident [facility]) Active Problems:   AKI (acute kidney injury) ([place])   Gastroesophageal reflux disease    Expected Discharge Date: Expected Discharge Date: 03/11/20  Team Members Present: Physician leading conference: Dr. Alysia Penna Care Coodinator Present: Dorien Chihuahua, RN, BSN, CRRN;Christina Sampson Goon, [place] Nurse Present: Other (comment) Tommie Sams, RN) PT Present: Estevan Ryder, PT OT Present: Clyda Greener, OT PPS Coordinator present : Ileana Ladd, PT     Current Status/Progress Goal Weekly Team Focus  Bowel/Bladder   Pt is continent of bowel and bladder. LBM-11/01  To remain continent of bowel and bladder.  Assess toileting as needed.   Swallow/Nutrition/ Hydration             ADL's   Supervision for all bathing and dressing, as well as toilet transfers.  He uses the RUE at a diminshed level for selfcare tasks with supervision.  supervision overall  neuromuscular re-education, balance retraining, transfer training, pt/family education, DME education   Mobility   Ind bed mobility, SPV STS, SPV transfers, gait >235ft using RW and SPV, CGA 4 steps without HR or SPV with HR  supervision overall at ambulatory level  R LE NMR, dynamic standing balance, stair negotiation, pt education, family ed   Communication             Safety/Cognition/ Behavioral Observations            Pain   no complaints of pain.  To remain pain free.  Assess pain q  shift or prn.   Skin   Ecchymosis to left arm, skin is intact.  To prevent breakdown from occurring.  Assess skin q shift or prn.     Discharge Planning:  Discharging home with DTR and spouse. RW ordered, OP set   Team Discussion: MD noted gabapentin at night has helped with neuropathic pain. BP controlled. Right side weakness however currently supervision for transfers.  Patient on target to meet rehab goals: Yes; on target for discharge  *See Care Plan and progress notes for long and short-term goals.   Revisions to Treatment Plan:   Teaching Needs:   Current Barriers to Discharge: None  Possible Resolutions to Barriers:     Medical Summary Current Status: Spasticity improved, does have some neuropathic type pain improved on gabapentin.  No longer on Klonopin.  Blood pressures improved  Barriers to Discharge: Medical stability   Possible Resolutions to [employer] Focus: Discharge training and education regarding stroke plan discharge in a.m.   Continued Need for Acute Rehabilitation Level of Care: The patient requires daily medical management by a physician with specialized training in physical medicine and rehabilitation for the following reasons: Direction of a multidisciplinary physical rehabilitation program to maximize functional independence : Yes Medical management of patient stability for increased activity during participation in an intensive rehabilitation regime.: Yes Analysis of laboratory values and/or radiology reports with any subsequent need for medication adjustment and/or medical intervention. : Yes   I  attest that I was present, lead the team conference, and concur with the assessment and plan of the team.   Dorien Chihuahua B 03/10/2020, 1:39 PM

## 2020-03-10 NOTE — Progress Notes (Signed)
Physical Therapy Session Note  Patient Details  Name: Hector [name] MRN: [id number] Date of Birth: Nov 07, 1940  Today's Date: 03/10/2020 PT Individual Time: 0803-0903 PT Individual Time Calculation (min): 60 min   Short Term Goals: Week 3:  PT Short Term Goal 1 (Week 3): STG = LTG due to ELOS   Skilled Therapeutic Interventions/Progress Updates:   Pt received supine in bed and agreeable to PT. Supine>sit transfer without assist or cues from PT. Pt requesting to use restroom and performed facial hygiene. Pt performed ambulatory transfer to toilet with RW and supervision assist from PT with noted foot drop on the RLE. Pt performed toilet transfer and hygiene without cues or assist and UE supported on rail as needed. Upper and lower body dressing EOB with set up assist only. Throughout session pt performed all sit<>stand transfers with supervision assist with and without AD.   Gait training  Pt performed ambulation through hall with CGA-supervision assist 2 x 158f  and RW. One near LOB due to foot drag on the RLE, but able to correct with UE support on RW without increased assist.   PT instructed pt in dynamic Neuromotor re-education: Reciprocal gait training with RW to step over 1 inch obstacles in the floor 4 x 4 with CGA leading with the LLE x 2 and the RLE x 1 to force improved activation of hip and knee flexors. Pt then performed without AD x 4 with min assist to improve pelvic rotation with noted improvement in step length/heel contact, and hip abductor activation.   Pt then performed step up/down leading with the RLE in ascent and LLE in descent to force activation of hip abductors and improve eccentric control of the RLE. Performed forward x 8 and laterally x 8.   [facility] and all needs met.         Therapy Documentation Precautions:  Precautions Precautions: Fall Precaution Comments: R-sided  weakness Restrictions Weight Bearing Restrictions: No   Pain: Pain Assessment Pain Scale: 0-10 Pain Score: 0-No pain    Therapy/Group: Individual Therapy  ALorie Phenix11/07/2019, 9:09 AM

## 2020-03-10 NOTE — Progress Notes (Signed)
Physical Therapy Discharge Summary  Patient Details  Name: Hector [name] MRN: [id number] Date of Birth: 10-03-1940   Patient has met 8 of 8 long term goals due to improved activity tolerance, improved balance, improved postural control, increased strength, increased range of motion, ability to compensate for deficits, functional use of  right upper extremity and right lower extremity, improved attention, improved awareness and improved coordination.  Patient to discharge at an ambulatory level Supervision.   Patient's care partner is independent to provide the necessary physical assistance at discharge.   Recommendation:  Patient will benefit from ongoing skilled PT services in outpatient setting to continue to advance safe functional mobility, address ongoing impairments in coordination, dynamic balance, gait, endurance, stair negotiation, and minimize fall risk.  Equipment: RW  Reasons for discharge: treatment goals met and discharge from hospital  Patient/family agrees with progress made and goals achieved: Yes  PT Discharge Precautions/Restrictions Precautions Precautions: Fall Precaution Comments: R-sided weakness Restrictions Weight Bearing Restrictions: No Vision/Perception  Perception Perception: Within Functional Limits Praxis Praxis: Intact  Cognition Overall Cognitive Status: Within Functional Limits for tasks assessed Arousal/Alertness: Awake/alert Memory: Appears intact Awareness: Appears intact Problem Solving: Appears intact Safety/Judgment: Appears intact Sensation Sensation Light Touch: Impaired by gross assessment (distal B LE related to neuropathy) Proprioception: Impaired by gross assessment (related to neuropathy in B LE) Stereognosis: Appears Intact Additional Comments: mild baseline neuropathy with mild parathesia Coordination Gross Motor Movements are Fluid and Coordinated: No Fine Motor Movements are Fluid and Coordinated: No Coordination and  Movement Description: R LE impaired d/t weakness 9 Hole Peg Test: limited ROM in R LE related to weakness Motor  Motor Motor: Hemiplegia Motor - Skilled Clinical Observations: RUE/RLE Motor - Discharge Observations: Still with mild hemiparesis in the RUE and RLE  Mobility Bed Mobility Bed Mobility: Rolling Right;Rolling Left;Sit to Supine;Supine to Sit Rolling Right: Independent Rolling Left: Independent Supine to Sit: Independent Sit to Supine: Independent Transfers Transfers: Sit to Stand;Stand to Sit;Stand Pivot Transfers Sit to Stand: Supervision/Verbal cueing Stand to Sit: Supervision/Verbal cueing Stand Pivot Transfers: Supervision/Verbal cueing Stand Pivot Transfer Details: Verbal cues for precautions/safety Transfer (Assistive device): Rolling Armenti Locomotion  Gait Ambulation: Yes Gait Assistance: Supervision/Verbal cueing Gait Distance (Feet): 200 Feet Assistive device: Rolling Nylund Gait Assistance Details: Verbal cues for precautions/safety Gait Gait: Yes Gait Pattern: Impaired Gait Pattern: Lateral trunk lean to left;Decreased dorsiflexion - right;Right genu recurvatum;Narrow base of support;Poor foot clearance - right Gait velocity: decreased High Level Ambulation High Level Ambulation: Side stepping;Backwards walking;Direction changes;Sudden stops Stairs / Additional Locomotion Stairs: Yes Stairs Assistance: Contact Guard/Touching assist Stair Management Technique: No rails Number of Stairs: 4 Height of Stairs: 6 Ramp: Supervision/Verbal cueing Curb: Supervision/Verbal cueing Wheelchair Mobility Wheelchair Mobility: No  Trunk/Postural Assessment  Cervical Assessment Cervical Assessment: Exceptions to [facility] Thoracic Assessment Thoracic Assessment: Exceptions to [facility] Lumbar Assessment Lumbar Assessment: Exceptions to [facility] Postural Control Postural Control: Deficits on evaluation Righting Reactions: delayed and inadequate Protective Responses: delayed  and inadequate  Balance Balance Balance Assessed: Yes Standardized Balance Assessment Standardized Balance Assessment: Berg Balance Test Berg Balance Test Sit to Stand: Able to stand without using hands and stabilize independently Standing Unsupported: Able to stand safely 2 minutes Sitting with Back Unsupported but Feet Supported on Floor or Stool: Able to sit safely and securely 2 minutes Stand to Sit: Sits safely with minimal use of hands Transfers: Able to transfer safely, minor use of hands Standing Unsupported with Eyes Closed: Able to stand 10 seconds with supervision Standing Ubsupported  with Feet Together: Able to place feet together independently and stand for 1 minute with supervision From Standing, Reach Forward with Outstretched Arm: Reaches forward but needs supervision From Standing Position, Pick up Object from Floor: Able to pick up shoe, needs supervision From Standing Position, Turn to Look Behind Over each Shoulder: Needs supervision when turning Turn 360 Degrees: Needs close supervision or verbal cueing Standing Unsupported, Alternately Place Feet on Step/Stool: Able to complete >2 steps/needs minimal assist Standing Unsupported, One Foot in Front: Able to plae foot ahead of the other independently and hold 30 seconds Standing on One Leg: Able to lift leg independently and hold 5-10 seconds Total Score: 39 Static Sitting Balance Static Sitting - Balance Support: Feet supported Static Sitting - Level of Assistance: 7: Independent Dynamic Sitting Balance Dynamic Sitting - Balance Support: During functional activity Dynamic Sitting - Level of Assistance: 6: Modified independent (Device/Increase time) Static Standing Balance Static Standing - Balance Support: During functional activity Static Standing - Level of Assistance: 5: Stand by assistance Dynamic Standing Balance Dynamic Standing - Balance Support: During functional activity Dynamic Standing - Level of  Assistance: 5: Stand by assistance Extremity Assessment      RLE Assessment RLE Assessment: Exceptions to [facility] RLE Strength Right Hip Flexion: 3+/5 Right Hip ABduction: 4-/5 Right Hip ADduction: 4-/5 Right Knee Flexion: 4-/5 Right Knee Extension: 3+/5 Right Ankle Dorsiflexion: 3-/5 Right Ankle Plantar Flexion: 3-/5 LLE Assessment LLE Assessment: Within Functional Limits  Family present for education and demonstrates safety and competency with assisting patient at time of dc. He has made significant progress toward his goals, initially requiring up to MaxA for functional mobility and is now grossly supervision. He should continue with OP PT to ensure consistent progress and improved dynamic and ambulatory balance to minimize risk for falls.   Hector [name] 03/10/2020, 7:49 AM

## 2020-03-10 NOTE — Progress Notes (Signed)
Physical Therapy Session Note  Patient Details  Name: Hector [name] MRN: [id number] Date of Birth: [birth year]  Today's Date: 03/10/2020 PT Individual Time: 1358-1441 PT Individual Time Calculation (min): 43 min   Short Term Goals: Week 3:  PT Short Term Goal 1 (Week 3): STG = LTG due to ELOS  Skilled Therapeutic Interventions/Progress Updates:    Patient received sitting up in wc, family present, agreeable to family Ed. He denies pain. Discussed with family progress made through therapy thus far and limitations remaining including R knee stability and instances of R toe drag in walking. Patient able to ambulate 264ft with RW and supervision to therapy gym. Discussed level of supervision patient needs: within arms reach when ambulating. He was able to negotiate 4 steps without HR use and CGA. Discussed balance recovery techniques with family for negotiating stairs and proper placement for guarding safely. Family declining to practice guarding patient at this time. He ambulated additional 59ft using RW and supervision. He was able to transfer into high car with supervision. Discussed pro/cons of high car vs low car with family as they have access to both. Patient able to demonstrate safe transfer into both types of cars. Discussed blocking car door if patient is going to use that as leverage to stand up from lower car. Patient and family verbalizing understanding. PT educating family on fall recovery and patients ability to complete floor transfers, given there are no injuries sustained. Patient ambulating back to room ~377ft with RW and supervision. LOB x1 with R toes catching. Patient demonstrating appropriate stepping strategy to regain balance with CGA. Patient returning to wc, chair alarm on, call light within reach. Family and patient without further questions at this time. He is safe to dc home with 24/7 family support and OP PT.   Therapy Documentation Precautions:  Precautions Precautions:  Fall Precaution Comments: R-sided weakness Restrictions Weight Bearing Restrictions: No    Therapy/Group: Individual Therapy  Karoline Caldwell, PT, DPT, CBIS 03/10/2020, 7:48 AM

## 2020-03-10 NOTE — Progress Notes (Addendum)
[place] PHYSICAL MEDICINE & REHABILITATION PROGRESS NOTE   Subjective/Complaints: No issues overnight, slept okay no complaints of neuropathy.  ROS: Patient denies CP, SOB N/V/D  Objective:   No results found. No results for input(s): WBC, HGB, HCT, PLT in the last 72 hours. No results for input(s): NA, K, CL, CO2, GLUCOSE, BUN, CREATININE, CALCIUM in the last 72 hours.  Intake/Output Summary (Last 24 hours) at 03/10/2020 0946 Last data filed at 03/10/2020 0850 Gross per 24 hour  Intake 1306 ml  Output 800 ml  Net 506 ml        Physical Exam: Vital Signs Blood pressure (!) 146/76, pulse (!) 58, temperature 97.6 F (36.4 C), temperature source Oral, resp. rate 16, height $RemoveBe'5\' 11"'AanamYKkv$  (1.803 m), weight 86.1 kg, SpO2 96 %.    General: No acute distress Mood and affect are appropriate Heart: Regular rate and rhythm no rubs murmurs or extra sounds Lungs: Clear to auscultation, breathing unlabored, no rales or wheezes Abdomen: Positive bowel sounds, soft nontender to palpation, nondistended Extremities: No clubbing, cyanosis, or edema Skin: No evidence of breakdown, no evidence of rash    Neuro: Alert Motor: LUE/LE: 5/5 proximal distal, stable RUE: 3- to 3/5 proximal distal, fine motor now able to touch middle finger to thumb RLE: Hip flexion, knee extension 4-/5, ankle dorsiflexion 2/5--stable exam  Assessment/Plan: 1. 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  Physiatrist is providing close team supervision and 24 hour management of active medical problems listed below.  Physiatrist and rehab team continue to assess barriers to discharge/monitor patient progress toward functional and medical goals  Care Tool:  Bathing  Bathing activity did not occur: Refused (Declined on this date, however agreeable to bathing at next OT session) Body parts  bathed by patient: Right arm, Left arm, Chest, Abdomen, Front perineal area, Buttocks, Right upper leg, Left upper leg, Left lower leg, Right lower leg, Face   Body parts bathed by helper: Left arm, Buttocks Body parts n/a: Left lower leg, Right lower leg, Left upper leg, Right upper leg, Abdomen, Chest, Left arm, Right arm   Bathing assist Assist Level: Contact Guard/Touching assist     Upper Body Dressing/Undressing Upper body dressing   What is the patient wearing?: Pull over shirt    Upper body assist Assist Level: Set up assist    Lower Body Dressing/Undressing Lower body dressing      What is the patient wearing?: Pants, Underwear/pull up     Lower body assist Assist for lower body dressing: Contact Guard/Touching assist     Toileting Toileting Toileting Activity did not occur (Clothing management and hygiene only): N/A (no void or bm)  Toileting assist Assist for toileting: Minimal Assistance - Patient > 75% Assistive Device Comment:  (urinal/stedy)   Transfers Chair/bed transfer  Transfers assist     Chair/bed transfer assist level: Supervision/Verbal cueing Chair/bed transfer assistive device: Museum/gallery exhibitions officer assist      Assist level: Contact Guard/Touching assist Assistive device: Moist-rolling Max distance: 116ft   Walk 10 feet activity   Assist     Assist level: Contact Guard/Touching assist Assistive device: Ramesh-rolling   Walk 50 feet activity   Assist Walk 50 feet with 2 turns activity did not occur: Safety/medical concerns  Assist level: Contact Guard/Touching assist Assistive device: Joaquin-rolling    Walk 150 feet activity   Assist Walk 150 feet  activity did not occur: Safety/medical concerns  Assist level: Contact Guard/Touching assist Assistive device: Brix-rolling    Walk 10 feet on uneven surface  activity   Assist Walk 10 feet on uneven surfaces activity did not occur:  Safety/medical concerns   Assist level: Minimal Assistance - Patient > 75% Assistive device: Aeronautical engineer Will patient use wheelchair at discharge?: No Type of Wheelchair: Manual    Wheelchair assist level: Minimal Assistance - Patient > 75% Max wheelchair distance: 150    Wheelchair 50 feet with 2 turns activity    Assist        Assist Level: Minimal Assistance - Patient > 75%   Wheelchair 150 feet activity     Assist      Assist Level: Independent   Blood pressure (!) 146/76, pulse (!) 58, temperature 97.6 F (36.4 C), temperature source Oral, resp. rate 16, height $RemoveBe'5\' 11"'gTGRMPBIv$  (1.803 m), weight 86.1 kg, SpO2 96 %.  Medical Problem List and Plan: 1.  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  WHO/PRAFO nightly Team conference today please see physician documentation under team conference tab, met with team  to discuss problems,progress, and goals. Formulized individual treatment plan based on medical history, underlying problem and comorbidities. 2.  Antithrombotics: -DVT/anticoagulation:               -antiplatelet therapy: Aspirin 81 mg daily and Plavix 75 mg daily x3 weeks followed by Plavix alone, this should be around the time of discharge 3. Pain Management: Tylenol as needed. Well controlled  -prn heat for muscle soreness Neuropathy pain add gabapentn dc klonopin 4. Mood: Provide emotional support             -antipsychotic agents: N/A 5. Neuropsych: This patient is capable of making decisions on his own behalf. 6. Skin/Wound Care: Routine skin checks. 7. Fluids/Electrolytes/Nutrition: Routine in and outs. 8.  Hypertension. Tenormin 50 mg daily, Cozaar 25 mg daily, increase to 50 Vitals:   03/09/20 1943 03/10/20 0404  BP: 129/72 (!) 146/76  Pulse: 65 (!) 58  Resp: 15 16  Temp: 98.4 F (36.9  C) 97.6 F (36.4 C)  SpO2: 97% 96%   Well controlled 11/23 9.  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  BPH with TURP.    Latest PVRs without retention 11.    Hyperlipidemia: Lipitor  12.  Hx GERD, used carafate at home may resume if having daily sx  Improved 13.  Spasticity post CVA, improved no current issues   LOS: 19 days A FACE TO FACE EVALUATION WAS PERFORMED  Charlett Blake 03/10/2020, 9:46 AM

## 2020-03-10 NOTE — Progress Notes (Signed)
Patient ID: Hector [name], male   DOB: 12/09/1940, 79 y.o.   MRN: [id number] Team Conference Report to Patient/Family  Team Conference discussion was reviewed with the patient and caregiver, including goals, any changes in plan of care and target discharge date.  Patient and caregiver express understanding and are in agreement.  The patient has a target discharge date of 03/11/20.  Dyanne Iha 03/10/2020, 1:46 PM

## 2020-03-10 NOTE — Progress Notes (Signed)
Occupational Therapy Session Note  Patient Details  Name: Hector [name] MRN: [id number] Date of Birth: 02-May-1941  Today's Date: 03/10/2020 OT Individual Time: 9211-9417 OT Individual Time Calculation (min): 41 min    Short Term Goals: Week 1:  OT Short Term Goal 1 (Week 1): Pt will complete LB dressing with mod assist while using hemi-dressing technique OT Short Term Goal 1 - Progress (Week 1): Met OT Short Term Goal 2 (Week 1): Pt will complete toilet transfer with min assist OT Short Term Goal 2 - Progress (Week 1): Met OT Short Term Goal 3 (Week 1): Pt will complete bathing with mod assist. OT Short Term Goal 3 - Progress (Week 1): Met  Skilled Therapeutic Interventions/Progress Updates:    1:1. Pt received in w/c agreeable to OT and declined bathing/dressing. Pt with no pain. Pt completes standing balance activity while playing checkers on a vertical board with forced use of RUE for moving checkers. Pt standings in regular, staggered, and narrow stance with 1LOB only in narrow stance reaching far R. Pt completes close supervision toileting with S for all tasks in standing. Exited session with pt seated in w/c, exit alarm on and call light in reach   Therapy Documentation Precautions:  Precautions Precautions: Fall Precaution Comments: R-sided weakness Restrictions Weight Bearing Restrictions: No General:   Vital Signs: Therapy Vitals Temp: 97.6 F (36.4 C) Temp Source: Oral Pulse Rate: (!) 58 Resp: 16 BP: (!) 146/76 Patient Position (if appropriate): Lying Oxygen Therapy SpO2: 96 % O2 Device: Room Air Pain:   ADL:   Vision   Perception    Praxis   Exercises:   Other Treatments:     Therapy/Group: Individual Therapy  Tonny Branch 03/10/2020, 7:00 AM

## 2020-03-10 NOTE — Progress Notes (Addendum)
Occupational Therapy Discharge Summary  Patient Details  Name: Hector [name] MRN: [id number] Date of Birth: October 20, 1940  Today's Date: 03/10/2020 OT Individual Time: 4196-2229 OT Individual Time Calculation (min): 47 min   Session Note:  Pt's family in for education during session.  He was able to perform simulated walk-in shower transfer as well as simulated toilet transfer with supervision using the RW for support.  Discussed the need for a small shower seat and pt's daughter can purchase outside of the hospital.  He was able to ambulated to and from the ADL apartment with supervision using the RW for support.  Provided demonstration and handouts on strengthening exercises as well FM coordination exercises to be complete daily at home.    Patient has met 8 of 8 long term goals due to improved activity tolerance, improved balance, postural control, ability to compensate for deficits, functional use of  RIGHT upper and RIGHT lower extremity and improved coordination.  Patient to discharge at overall Supervision level.  Patient's care partner is independent to provide the necessary physical assistance at discharge.    Reasons goals not met: NA  Recommendation:  Patient will benefit from ongoing skilled OT services in outpatient setting to continue to advance functional skills in the area of BADL and Reduce care partner burden.  Hector [name] continues to demonstrate RUE and RLE hemiparesis, however he is progressing well and is currently overall supervision for ADL tasks and transfers.  Recommend continued outpatient OT to further progress ADL function as well as RUE functional use in order to reach modified independent/ independent level.    Equipment: No equipment provided  Reasons for discharge: treatment goals met and discharge from hospital  Patient/family agrees with progress made and goals achieved: Yes  OT Discharge Precautions/Restrictions  Precautions Precautions: Fall Precaution  Comments: R-sided weakness Restrictions Weight Bearing Restrictions: No  Pain  No report of pain ADL ADL Eating: Modified independent Where Assessed-Eating: Wheelchair Grooming: Setup Where Assessed-Grooming: Sitting at sink Upper Body Bathing: Supervision/safety Where Assessed-Upper Body Bathing: Sitting at sink Lower Body Bathing: Supervision/safety Where Assessed-Lower Body Bathing: Shower Upper Body Dressing: Supervision/safety Where Assessed-Upper Body Dressing: Wheelchair Lower Body Dressing: Supervision/safety Where Assessed-Lower Body Dressing: Wheelchair Toileting: Supervision/safety Where Assessed-Toileting: Glass blower/designer: Close supervision Toilet Transfer Method: Counselling psychologist: Raised toilet seat, Energy manager: Close supervision Social research officer, government Method: Heritage manager: Gaffer Baseline Vision/History: Wears glasses Wears Glasses: Reading only Patient Visual Report: No change from baseline Vision Assessment?: No apparent visual deficits Eye Alignment: Within Functional Limits Ocular Range of Motion: Within Functional Limits Tracking/Visual Pursuits: Able to track stimulus in all quads without difficulty Saccades: Within functional limits Visual Fields: No apparent deficits Perception  Perception: Within Functional Limits Praxis Praxis: Intact Cognition Overall Cognitive Status: Within Functional Limits for tasks assessed Arousal/Alertness: Awake/alert Attention: Alternating Memory: Appears intact Awareness: Appears intact Problem Solving: Appears intact Safety/Judgment: Appears intact Sensation Sensation Light Touch: Appears Intact Hot/Cold: Appears Intact Proprioception: Appears Intact Stereognosis: Not tested Additional Comments: Per gross testing, sensation intact in BUEs and hands. Coordination Gross Motor Movements are Fluid and Coordinated:  No Fine Motor Movements are Fluid and Coordinated: No Coordination and Movement Description: Pt uses the RUE at a diminshed level for selfcare tasks.  He is able to tie his shoes with increased time as well as use the RUE for pulling up pants.  Self feeding is still limited however secondary to decreased control when transferring from plate  to mouth. Motor  Motor Motor: Hemiplegia Motor - Skilled Clinical Observations: RUE/RLE Motor - Discharge Observations: Still with mild hemiparesis in the RUE and RLE Mobility  Bed Mobility Bed Mobility: Rolling Right;Rolling Left;Sit to Supine;Supine to Sit Rolling Right: Independent Rolling Left: Independent Supine to Sit: Independent Sit to Supine: Independent Transfers Sit to Stand: Supervision/Verbal cueing Stand to Sit: Supervision/Verbal cueing  Trunk/Postural Assessment  Cervical Assessment Cervical Assessment: Exceptions to [facility] (cervical protraction at rest) Thoracic Assessment Thoracic Assessment: Exceptions to [facility] (thoracic kyphosis) Lumbar Assessment Lumbar Assessment: Exceptions to [facility] (neutral to posterior pelvic tilt in sitting.)  Balance Balance Balance Assessed: Yes Static Sitting Balance Static Sitting - Balance Support: Bilateral upper extremity supported;Feet supported Static Sitting - Level of Assistance: 7: Independent Dynamic Sitting Balance Dynamic Sitting - Balance Support: During functional activity Dynamic Sitting - Level of Assistance: 6: Modified independent (Device/Increase time) Static Standing Balance Static Standing - Balance Support: During functional activity Static Standing - Level of Assistance: 5: Stand by assistance Dynamic Standing Balance Dynamic Standing - Balance Support: During functional activity Dynamic Standing - Level of Assistance: 5: Stand by assistance Extremity/Trunk Assessment RUE Assessment RUE Assessment: Exceptions to [facility] Passive Range of Motion (PROM) Comments: WFL Active Range of  Motion (AROM) Comments: AAROM WFLS for all joints, slight limitations in digit extension secondary to arthritic changes General Strength Comments: 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  He is able to use the UE at a diminshed level for bathing tasks and tying shoes. LUE Assessment LUE Assessment: Within Functional Limits General Strength Comments: Strength 4/5 throughout.  Pt with history of Dupuytren's contraction in the ring finger, but does not affect functional use during selfcare tasks.   Rigby Swamy OTR/L 03/10/2020, 4:07 PM

## 2020-03-11 ENCOUNTER — Inpatient Hospital Stay: Payer: Medicare PPO

## 2020-03-11 DIAGNOSIS — Z23 Encounter for immunization: Secondary | ICD-10-CM

## 2020-03-11 NOTE — Progress Notes (Signed)
Patient resting throughout shift, respiration even and unlabored on RA,coloration adequate, no complaints. Anticipates discharge home today

## 2020-03-11 NOTE — Progress Notes (Signed)
Patient left alert and oriented and without c/o at about 1155.

## 2020-03-11 NOTE — Progress Notes (Signed)
Inpatient Rehabilitation Care Coordinator  Discharge Note  The overall goal for the admission was met for:   Discharge location: Yes, Home  Length of Stay: Yes, 20 Days  Discharge activity level: Yes, Supervision Level  Home/community participation: Yes  Services provided included: MD, RD, PT, OT, SLP, RN, CM, TR, Pharmacy, [place]: Private Insurance: [employer]  Follow-up services arranged: Outpatient: [facility]   Comments (or additional information): PT and OT  Patient/Family verbalized understanding of follow-up arrangements: Yes  Individual responsible for coordination of the follow-up plan: [name] (DTR), [phone]  Confirmed correct DME delivered: Dyanne Iha 03/11/2020    Dyanne Iha

## 2020-03-11 NOTE — Discharge Instructions (Signed)
Inpatient Rehab Discharge Instructions  Hector [name] Discharge date and time: No discharge date for patient encounter.   Activities/Precautions/ Functional Status: Activity: activity as tolerated Diet: regular diet Wound Care: Routine skin checks Functional status:  ___ No restrictions     ___ Walk up steps independently ___ 24/7 supervision/assistance   ___ Walk up steps with assistance ___ Intermittent supervision/assistance  ___ Bathe/dress independently ___ Walk with [place]     _x__ Bathe/dress with assistance ___ Walk Independently    ___ Shower independently ___ Walk with assistance    ___ Shower with assistance ___ No alcohol     ___ Return to work/school ________ COMMUNITY REFERRALS UPON DISCHARGE:     Outpatient: PT     OT                Agency: [facility] ( [address], [place], [place] [address]) Phone: [phone]              Appointment Date/Time: Facility to schedule on Friday 03/12/2020   Special Instructions: PCP established at [facility]. New patient appointment scheduled on April 19, 2020 at 2:00 PM with Dr. Otilio Miu.  No driving smoking or alcohol  Continue aspirin 81 mg daily and Plavix 75 mg daily x3 weeks then Plavix alone STROKE/TIA DISCHARGE INSTRUCTIONS SMOKING Cigarette smoking nearly doubles your risk of having a stroke & is the single most alterable risk factor  If you smoke or have smoked in the last 12 months, you are advised to quit smoking for your health.  Most of the excess cardiovascular risk related to smoking disappears within a year of stopping.  Ask you doctor about anti-smoking medications  [place] Quit Line: 1-800-QUIT NOW  Free Smoking Cessation Classes [phone]  CHOLESTEROL Know your levels; limit fat & cholesterol in your diet  Lipid Panel     Component Value Date/Time   CHOL 148 02/17/2020 1236   CHOL 133 02/01/2016 1204   TRIG 179 (H) 02/17/2020 1236   HDL 35 (L) 02/17/2020  1236   HDL 40 02/01/2016 1204   CHOLHDL 4.2 02/17/2020 1236   VLDL 36 02/17/2020 1236   LDLCALC 77 02/17/2020 1236   [place] 70 02/01/2016 1204      Many patients benefit from treatment even if their cholesterol is at goal.  Goal: Total Cholesterol (CHOL) less than 160  Goal:  Triglycerides (TRIG) less than 150  Goal:  HDL greater than 40  Goal:  LDL (LDLCALC) less than 100   BLOOD PRESSURE American Stroke Association blood pressure target is less that 120/80 mm/Hg  Your discharge blood pressure is:  BP: 133/78  Monitor your blood pressure  Limit your salt and alcohol intake  Many individuals will require more than one medication for high blood pressure  DIABETES (A1c is a blood sugar average for last 3 months) Goal HGBA1c is under 7% (HBGA1c is blood sugar average for last 3 months)  Diabetes: No known diagnosis of diabetes    Lab Results  Component Value Date   HGBA1C 6.2 (H) 02/17/2020     Your HGBA1c can be lowered with medications, healthy diet, and exercise.  Check your blood sugar as directed by your physician  Call your physician if you experience unexplained or low blood sugars.  PHYSICAL ACTIVITY/REHABILITATION Goal is 30 minutes at least 4 days per week  Activity: Increase activity slowly, Therapies: Physical Therapy: Home Health Return to work:   Activity decreases your risk  of heart attack and stroke and makes your heart stronger.  It helps control your weight and blood pressure; helps you relax and can improve your mood.  Participate in a regular exercise program.  Talk with your doctor about the best form of exercise for you (dancing, walking, swimming, cycling).  DIET/WEIGHT Goal is to maintain a healthy weight  Your discharge diet is:  Diet Order            Diet Heart Room service appropriate? Yes; Fluid consistency: Thin  Diet effective now                 liquids Your height is:    Your current weight is:   Your Body Mass Index (BMI) is:      Following the type of diet specifically designed for you will help prevent another stroke.  Your goal weight range is:    Your goal Body Mass Index (BMI) is 19-24.  Healthy food habits can help reduce 3 risk factors for stroke:  High cholesterol, hypertension, and excess weight.  RESOURCES Stroke/Support Group:  Call [phone]   STROKE EDUCATION PROVIDED/REVIEWED AND GIVEN TO PATIENT Stroke warning signs and symptoms How to activate emergency medical system (call 911). Medications prescribed at discharge. Need for follow-up after discharge. Personal risk factors for stroke. Pneumonia vaccine given:  Flu vaccine given:  My questions have been answered, the writing is legible, and I understand these instructions.  I will adhere to these goals & educational materials that have been provided to me after my discharge from the hospital.      My questions have been answered and I understand these instructions. I will adhere to these goals and the provided educational materials after my discharge from the hospital.  Patient/Caregiver Signature _______________________________ Date __________  Clinician Signature _______________________________________ Date __________  Please bring this form and your medication list with you to all your follow-up doctor's appointments.

## 2020-03-11 NOTE — Progress Notes (Signed)
   Covid-19 Vaccination Clinic  Name:  Hector [name]    MRN: [id number] DOB: Aug 15, 1940  03/11/2020  [name] was observed post Covid-19 immunization for 15 minutes without incident. He was provided with Vaccine Information Sheet and instruction to access the V-Safe system.   [name] was instructed to call 911 with any severe reactions post vaccine: Marland Kitchen Difficulty breathing  . Swelling of face and throat  . A fast heartbeat  . A bad rash all over body  . Dizziness and weakness

## 2020-03-11 NOTE — Progress Notes (Signed)
[facility] PHYSICAL MEDICINE & REHABILITATION PROGRESS NOTE   Subjective/Complaints: Feels he has improved a lot here. Ready for discharge. No complaints this morning. Denies pain, constipation, insomnia.   ROS: Patient denies CP, SOB N/V/D  Objective:   No results found. No results for input(s): WBC, HGB, HCT, PLT in the last 72 hours. No results for input(s): NA, K, CL, CO2, GLUCOSE, BUN, CREATININE, CALCIUM in the last 72 hours.  Intake/Output Summary (Last 24 hours) at 03/11/2020 0754 Last data filed at 03/11/2020 0728 Gross per 24 hour  Intake 1432 ml  Output 500 ml  Net 932 ml        Physical Exam: Vital Signs Blood pressure 125/73, pulse 62, temperature 98.4 F (36.9 C), temperature source Oral, resp. rate 16, height 5\' 11"  (1.803 m), weight 86.1 kg, SpO2 99 %.  General: Alert, No apparent distress HEENT: Head is normocephalic, atraumatic, PERRLA, EOMI, sclera anicteric, oral mucosa pink and moist, dentition intact, ext ear canals clear,  Neck: Supple without JVD or lymphadenopathy Heart: Reg rate and rhythm. No murmurs rubs or gallops Chest: CTA bilaterally without wheezes, rales, or rhonchi; no distress Abdomen: Soft, non-tender, non-distended, bowel sounds positive. Extremities: No clubbing, cyanosis, or edema. Pulses are 2+ Skin: Clean and intact without signs of breakdown  Neuro: Alert Motor: LUE/LE: 5/5 proximal distal, stable RUE: 3- to 3/5 proximal distal, fine motor now able to touch middle finger to thumb RLE: Hip flexion, knee extension 4-/5, ankle dorsiflexion 2/5--stable exam  Assessment/Plan: 1. 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  Physiatrist is providing close team supervision and 24 hour management of active medical problems listed below.  Physiatrist and rehab team continue to assess barriers to  discharge/monitor patient progress toward functional and medical goals  Care Tool:  Bathing  Bathing activity did not occur: Refused (Declined on this date, however agreeable to bathing at next OT session) Body parts bathed by patient: Right arm, Left arm, Chest, Abdomen, Front perineal area, Buttocks, Right upper leg, Left upper leg, Left lower leg, Right lower leg, Face   Body parts bathed by helper: Left arm, Buttocks Body parts n/a: Left lower leg, Right lower leg, Left upper leg, Right upper leg, Abdomen, Chest, Left arm, Right arm   Bathing assist Assist Level: Supervision/Verbal cueing     Upper Body Dressing/Undressing Upper body dressing   What is the patient wearing?: Pull over shirt    Upper body assist Assist Level: Set up assist    Lower Body Dressing/Undressing Lower body dressing      What is the patient wearing?: Pants, Underwear/pull up     Lower body assist Assist for lower body dressing: Supervision/Verbal cueing     Toileting Toileting Toileting Activity did not occur (Clothing management and hygiene only): N/A (no void or bm)  Toileting assist Assist for toileting: Supervision/Verbal cueing Assistive Device Comment:  (urinal/stedy)   Transfers Chair/bed transfer  Transfers assist     Chair/bed transfer assist level: Supervision/Verbal cueing Chair/bed transfer assistive device: Museum/gallery exhibitions officer assist      Assist level: Supervision/Verbal cueing Assistive device: Linville-rolling Max distance: 200'   Walk 10 feet activity   Assist     Assist level: Supervision/Verbal cueing Assistive device: Romo-rolling   Walk 50 feet activity   Assist Walk 50 feet with 2 turns activity did not occur: Safety/medical concerns  Assist level: Supervision/Verbal  cueing Assistive device: Terrance-rolling    Walk 150 feet activity   Assist Walk 150 feet activity did not occur: Safety/medical concerns  Assist  level: Supervision/Verbal cueing Assistive device: Housel-rolling    Walk 10 feet on uneven surface  activity   Assist Walk 10 feet on uneven surfaces activity did not occur: Safety/medical concerns   Assist level: Contact Guard/Touching assist Assistive device: Aeronautical engineer Will patient use wheelchair at discharge?: No Type of Wheelchair: Manual    Wheelchair assist level: Minimal Assistance - Patient > 75% Max wheelchair distance: 150    Wheelchair 50 feet with 2 turns activity    Assist        Assist Level: Minimal Assistance - Patient > 75%   Wheelchair 150 feet activity     Assist      Assist Level: Independent   Blood pressure 125/73, pulse 62, temperature 98.4 F (36.9 C), temperature source Oral, resp. rate 16, height 5\' 11"  (1.803 m), weight 86.1 kg, SpO2 99 %.  Medical Problem List and Plan: 1.  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  WHO/PRAFO nightly 2.  Antithrombotics: -DVT/anticoagulation:               -antiplatelet therapy: Aspirin 81 mg daily and Plavix 75 mg daily x3 weeks followed by Plavix alone, this should be around the time of discharge 3. Pain Management: Tylenol as needed. Well controlled  -prn heat for muscle soreness Neuropathy pain add gabapentn dc klonopin 4. Mood: Provide emotional support             -antipsychotic agents: N/A 5. Neuropsych: This patient is capable of making decisions on his own behalf. 6. Skin/Wound Care: Routine skin checks. 7. Fluids/Electrolytes/Nutrition: Routine in and outs. 8.  Hypertension. Tenormin 50 mg daily, Cozaar 25 mg daily, increase to 50 Vitals:   03/10/20 2039 03/11/20 0521  BP: 138/70 125/73  Pulse: (!) 58 62  Resp: 18 16  Temp: (!) 97.5 F (36.4 C) 98.4 F (36.9 C)  SpO2: 98% 99%   Well controlled 11/4 9.   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  BPH with TURP.    Latest PVRs without retention 11.    Hyperlipidemia: Lipitor 12.  Hx GERD, used carafate at home may resume if having daily sx  Improved 13.  Spasticity post CVA, improved no current issues 14. Cramps/spasms at night: Recommended magnesium and potassium rich foods.   >30 minutes spent in discharge of patient including review of medications and follow-up appointments, physical examination, cramps/spasms at night, discussion of BP, discussion of Gabapentin, and in answering all patient's questions   LOS: 20 days A FACE TO FACE EVALUATION WAS PERFORMED  Martha Clan P Brandi Armato 03/11/2020, 7:54 AM

## 2020-03-17 ENCOUNTER — Telehealth: Payer: Self-pay | Admitting: *Deleted

## 2020-03-17 NOTE — Telephone Encounter (Signed)
Transitional Care call completed appointment confirmed Spoke with patients daughter, [name] Confirmed address Connfirmed that patient will attend scheduled appointment    1. Are you/is patient experiencing any problems since coming home? Weaker than usual, less stable with gait, concerned of digressing without therapy, patient increased depression.  Are there any questions regarding any aspect of care?  No  2. Are there any questions regarding medications administration/dosing?  No Are meds being taken as prescribed? Yes Patient should review meds with caller to confirm   3. Have there been any falls?  No  4. Has Home Health been to the house and/or have they contacted you? Straight to outpatient If not, have you tried to contact them? Can we help you contact them?   5. Are bowels and bladder emptying properly? Yes Are there any unexpected incontinence issues? no If applicable, is patient following bowel/bladder programs?   6. Any fevers, problems with breathing, unexpected pain? no  7. Are there any skin problems or new areas of breakdown? Right arm bruising   8. Has the patient/family member arranged specialty MD follow up (ie cardiology/neurology/renal/surgical/etc)? Dr. Letta Pate only recommended follow up  Can we help arrange?   9. Does the patient need any other services or support that we can help arrange? No  10. Are caregivers following through as expected in assisting the patient? Yes  11. Has the patient quit smoking, drinking alcohol, or using drugs as recommended? Patient no smoke, occasional drink, no illicit drug use

## 2020-03-23 ENCOUNTER — Encounter: Payer: Self-pay | Admitting: Registered Nurse

## 2020-03-23 ENCOUNTER — Other Ambulatory Visit: Payer: Self-pay

## 2020-03-23 ENCOUNTER — Encounter: Payer: Medicare PPO | Attending: Registered Nurse | Admitting: Registered Nurse

## 2020-03-23 VITALS — BP 145/83 | HR 70 | Temp 97.8°F | Ht 71.0 in | Wt 194.0 lb

## 2020-03-23 DIAGNOSIS — E7849 Other hyperlipidemia: Secondary | ICD-10-CM | POA: Insufficient documentation

## 2020-03-23 DIAGNOSIS — I639 Cerebral infarction, unspecified: Secondary | ICD-10-CM | POA: Insufficient documentation

## 2020-03-23 DIAGNOSIS — I10 Essential (primary) hypertension: Secondary | ICD-10-CM | POA: Diagnosis not present

## 2020-03-23 NOTE — Patient Instructions (Signed)
[facility]: [phone], should be calling for Hospital Follow Up Appointment.   If you don't hear from them call Zella Ball [phone]

## 2020-03-23 NOTE — Progress Notes (Signed)
Subjective:    Patient ID: Hector [name], male    DOB: 1940-09-06, 79 y.o.   MRN: [id number]  HPI: Hector [name] is a 79 y.o. male who is here for HFU regarding his  Left Pontine Cerebrovascular accident, Essential Hypertension and Hyperlipidemia. He [facility] to [facility] on [facility]/11/[facility]21with right side weakness and dysarthria.Neurology was consulted.  CT Head WO Contrast:  IMPRESSION: 1. 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 2. No large CT evident area of large vascular territory or cortically based infarction or other acute intracranial abnormality. If there is persisting concern for acute infarction, MRI is more sensitive and specific for early features of ischemia. 3. Parenchymal volume loss and chronic microvascular angiopathy. 4. Intracranial atherosclerosis. CT: Angio: Head W or WO Contrast: CT Angio Neck W and WO Contrast IMPRESSION: 1. No emergent large vessel occlusion or hemodynamically significant stenosis of the head or neck. 2. Chronic ischemic microangiopathy and generalized atrophy.  Aortic Atherosclerosis (ICD10-I70.0).  MR Brain WO Contrast:  IMPRESSION: 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 No hemorrhage or mass effect.  Hector [name] admitted to inpatient Rehabilitation on 02/20/2020 and discharged home on 03/11/2020, He is scheduled to receive outpatient therapy at [facility].  He denies any pain. He rates his pain 0. Also reports he has a good appetite.  [facility] was called and message left to schedule HFU appointment. Hector [name] and his daughter was instructed to call this provider if they don't receive a call from [facility], they verbalize understanding.     Pain Inventory Average Pain 0 Pain Right Now 0 My pain is no pain  LOCATION OF PAIN   None   BOWEL Number of stools per week: 7 Oral laxative use No  Type of laxative n/a Enema or suppository use No  History of colostomy No  Incontinent No   BLADDER Normal In and out cath, frequency n/a Able to self cath n/a Bladder incontinence No  Frequent urination No  Leakage with coughing No  Difficulty starting stream No  Incomplete bladder emptying No    Mobility use a Tracz ability to climb steps?  yes do you drive?  no  Function not employed: date last employed . I need assistance with the following:  meal prep  Neuro/Psych weakness numbness tingling trouble walking spasms  Prior Studies tansitions of care  Physicians involved in your care transitions of care   Family History  Problem Relation Age of Onset  . Stroke Mother 11  . Heart disease Father 76  . Cancer Paternal Uncle        Carcinoma of unknown origin.  . Colon cancer Neg Hx    Social History   Socioeconomic History  . Marital status: Married    Spouse name: Not on file  . Number of children: 2  . Years of education: Not on file  . Highest education level: Some college, no degree  Occupational History  . Not on file  Tobacco Use  . Smoking status: Former Smoker    Quit date: 07/25/1988    Years since quitting: 31.6  . Smokeless tobacco: Never Used  Vaping Use  . Vaping Use: Never used  Substance and Sexual Activity  . Alcohol use: Yes    Alcohol/week: 7.0 standard drinks    Types: 7 drink(s) per week  . Drug use: No  .  Sexual activity: Not on file  Other Topics Concern  . Not on file  Social History Narrative  . Not on file   Social Determinants of Health   Financial Resource Strain:   . Difficulty of Paying Living Expenses: Not on file  Food Insecurity:   . Worried About Charity fundraiser in the Last Year: Not on file  . Ran Out of Food in the Last Year: Not on file  Transportation Needs:   . Lack of Transportation (Medical): Not on file  . Lack of  Transportation (Non-Medical): Not on file  Physical Activity:   . Days of Exercise per Week: Not on file  . Minutes of Exercise per Session: Not on file  Stress:   . Feeling of Stress : Not on file  Social Connections:   . Frequency of Communication with Friends and Family: Not on file  . Frequency of Social Gatherings with Friends and Family: Not on file  . Attends Religious Services: Not on file  . Active Member of Clubs or Organizations: Not on file  . Attends Archivist Meetings: Not on file  . Marital Status: Not on file   Past Surgical History:  Procedure Laterality Date  . APPENDECTOMY  02/25/11  . COLONOSCOPY    . LOBECTOMY    . TRANSURETHRAL RESECTION OF PROSTATE  2004   Past Medical History:  Diagnosis Date  . Diverticulitis 04/2012   admission [facility]  . Former smoker    Smoked for 20 yrs;  Quit  25 yrs ago.  Marland Kitchen GERD (gastroesophageal reflux disease)   . Hypertension   . Hypokalemia   . Lung cancer ([place])   . 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 Polycythemia   . Prostatitis    S/P  TURP   BP (!) 145/83   Pulse 70   Temp 97.8 F (36.6 C)   Ht 5\' 11"  (1.803 m)   Wt 194 lb (88 kg)   SpO2 96%   BMI 27.06 kg/m   Opioid Risk Score:   Fall Risk Score:  `1  Depression screen PHQ 2/9  Depression screen [facility] 2/9 02/01/2016 07/27/2015 01/26/2015 08/18/2014 02/05/2014  Decreased Interest 0 0 0 0 0  Down, Depressed, Hopeless 0 0 0 0 0  PHQ - 2 Score 0 0 0 0 0    Review of Systems  Constitutional: Negative.   HENT: Negative.   Eyes: Negative.   Respiratory: Negative.   Cardiovascular: Negative.   Gastrointestinal: Negative.   Endocrine: Negative.   Genitourinary: Negative.   Musculoskeletal: Positive for gait problem.       Spasms   Neurological: Positive for weakness and numbness.       Tingling   All other systems reviewed and are negative.      Objective:   Physical Exam Vitals and nursing note reviewed.   Constitutional:      Appearance: Normal appearance.  Cardiovascular:     Rate and Rhythm: Normal rate and regular rhythm.     Pulses: Normal pulses.     Heart sounds: Normal heart sounds.  Pulmonary:     Effort: Pulmonary effort is normal.     Breath sounds: Normal breath sounds.  Musculoskeletal:     Cervical back: Normal range of motion and neck supple.     Comments: Normal Muscle Bulk and Muscle Testing Reveals:  Upper Extremities: Full ROM and Muscle Strength on Right 4/5 and Left 5/5 Lower Extremities: Full ROM and Muscle  Strength 5/5 Arises from Table slowly using Stcharles for support Narrow Based  Gait   Skin:    General: Skin is warm and dry.  Neurological:     Mental Status: He is alert and oriented to person, place, and time.  Psychiatric:        Mood and Affect: Mood normal.        Behavior: Behavior normal.           Assessment & Plan:  1. Left Pontine Cerebrovascular accident: Hector [name] has a scheduled appointment with Neurology on 04/08/2020. Continue current medication regimen. Continue with [facility]. 2. Essential Hypertension: Continue current medication regimen. PCP Following. Continue to monitor. 3. Hyperlipidemia: Continue current medication regimen. Continue to monitor.   F/U with Dr Letta Pate in 4-6 weeks

## 2020-04-08 ENCOUNTER — Encounter: Payer: Self-pay | Admitting: Adult Health

## 2020-04-08 ENCOUNTER — Ambulatory Visit: Payer: Medicare PPO | Admitting: Adult Health

## 2020-04-08 ENCOUNTER — Other Ambulatory Visit: Payer: Self-pay

## 2020-04-08 VITALS — BP 143/82 | HR 65 | Ht 71.0 in | Wt 196.0 lb

## 2020-04-08 DIAGNOSIS — E785 Hyperlipidemia, unspecified: Secondary | ICD-10-CM | POA: Diagnosis not present

## 2020-04-08 DIAGNOSIS — G609 Hereditary and idiopathic neuropathy, unspecified: Secondary | ICD-10-CM

## 2020-04-08 DIAGNOSIS — I639 Cerebral infarction, unspecified: Secondary | ICD-10-CM

## 2020-04-08 DIAGNOSIS — I10 Essential (primary) hypertension: Secondary | ICD-10-CM | POA: Diagnosis not present

## 2020-04-08 MED ORDER — GABAPENTIN 300 MG PO CAPS: 300.0000 mg | ORAL_CAPSULE | Freq: Two times a day (BID) | ORAL | 5 refills | Status: AC | PRN

## 2020-04-08 NOTE — Progress Notes (Signed)
I agree with the above plan 

## 2020-04-08 NOTE — Progress Notes (Signed)
[facility] [address] [place]. [place] [address] [phone]       HOSPITAL FOLLOW UP NOTE  [name] Date of Birth:  1940-10-15 Medical Record Number:  [id number]   Reason for Referral:  hospital stroke follow up    SUBJECTIVE:   CHIEF COMPLAINT:  Chief Complaint  Patient presents with  . Hospitalization Follow-up    pt has weakness on the right side butits getting better. Pt says he notices low back pain.  Marland Kitchen Cerebrovascular Accident    HPI:   [name] is a 79 y.o. male with history of lung caner, polycythemia, BPH s/p TURP, HTN, GERD, diverticulitis, former smoker who presented on 02/16/2020 with recurrent episodes of R sided weakness and abnormal speech.  Personally reviewed hospitalization pertinent progress notes, lab work and imaging with summary provided.  Evaluated by Dr. 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  Recommended DAPT for 3 weeks and Plavix alone as on aspirin PTA. HTN stable.  LDL 77 and initiated atorvastatin 20 mg daily.  Other stroke risk factors include advanced age, former tobacco use, EtOH use, and family history of stroke but no personal history of stroke.  Other active problems include history of lung cancer, colonic adenoma, polycythemia and prostatitis s/p TURP.  Per therapy evaluation, discharged to CIR on 02/20/2020.  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 No large abnormality. No acute abnormality. Small vessel disease. Atrophy.     CTA head & neck no LVO. Small vessel disease. Atrophy.   MRI  Small L paramedian pontine infarct   2D Echo EF 60-65%. No source of embolus   LDL 77  HgbA1c 6.2  VTE prophylaxis - Lovenox 40 mg sq daily   aspirin 81 mg daily prior to admission, now on aspirin 325 mg daily  and clopidogrel 75 mg daily following plavix load. Continue DAPT x 3 weeks then plavix alone.   Therapy recommendations:  CIR   Disposition:  CIR    Today, 04/08/2020, Hector [name] is being seen for hospital follow-up accompanied by his daughter.  Discharge from CIR on 03/11/2020 returning home with recommended outpatient therapy.  Residual right sided weakness and gait impairment with continued improvement. He has been slowly returning back to prior activities and currently staying with his daughter previously living independently. Working with [facility] PT/OT. Ambulating with RW or cane at all times. Denies new stroke/TIA symptoms.  Completed 3 weeks DAPT remains on Plavix alone without bleeding or bruising.  Remains on atorvastatin 20 mg daily.  Blood pressure today 143/82. Does not currently monitor at home.  Reports 15-year history of BLE numbness/tingling with paresthesias distally initially with progression currently b/l knee down and bilateral fingertips.  He reports history of neuropathy but no prior evaluation.  Paresthesias have been interfering with stroke recovery and overall ambulation.  Prescribed gabapentin 100 mg daily by outside provider (?PCP) but not started.  No further concerns at this time.   ROS:   14 system review of systems performed and negative with exception of those listed in HPI  PMH:  Past Medical History:  Diagnosis Date  . Diverticulitis 04/2012   admission [facility]  . Former smoker    Smoked for 20 yrs;  Quit  25 yrs ago.  Marland Kitchen GERD (gastroesophageal reflux disease)   .  Hypertension   . Hypokalemia   . Lung cancer ([place])   . 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 Polycythemia   . Prostatitis    S/P  TURP    PSH:  Past Surgical History:  Procedure Laterality Date  . APPENDECTOMY  02/25/11  . COLONOSCOPY    . LOBECTOMY    . TRANSURETHRAL RESECTION OF PROSTATE  2004    Social History:  Social History   Socioeconomic History  . Marital  status: Married    Spouse name: Not on file  . Number of children: 2  . Years of education: Not on file  . Highest education level: Some college, no degree  Occupational History  . Not on file  Tobacco Use  . Smoking status: Former Smoker    Quit date: 07/25/1988    Years since quitting: 31.7  . Smokeless tobacco: Never Used  Vaping Use  . Vaping Use: Never used  Substance and Sexual Activity  . Alcohol use: Yes    Alcohol/week: 7.0 standard drinks    Types: 7 drink(s) per week  . Drug use: No  . Sexual activity: Not on file  Other Topics Concern  . Not on file  Social History Narrative  . Not on file   Social Determinants of Health   Financial Resource Strain:   . Difficulty of Paying Living Expenses: Not on file  Food Insecurity:   . Worried About Charity fundraiser in the Last Year: Not on file  . Ran Out of Food in the Last Year: Not on file  Transportation Needs:   . Lack of Transportation (Medical): Not on file  . Lack of Transportation (Non-Medical): Not on file  Physical Activity:   . Days of Exercise per Week: Not on file  . Minutes of Exercise per Session: Not on file  Stress:   . Feeling of Stress : Not on file  Social Connections:   . Frequency of Communication with Friends and Family: Not on file  . Frequency of Social Gatherings with Friends and Family: Not on file  . Attends Religious Services: Not on file  . Active Member of Clubs or Organizations: Not on file  . Attends Archivist Meetings: Not on file  . Marital Status: Not on file  Intimate Partner Violence:   . Fear of Current or Ex-Partner: Not on file  . Emotionally Abused: Not on file  . Physically Abused: Not on file  . Sexually Abused: Not on file    Family History:  Family History  Problem Relation Age of Onset  . Stroke Mother 29  . Heart disease Father 16  . Cancer Paternal Uncle        Carcinoma of unknown origin.  . Colon cancer Neg Hx     Medications:   Current  Outpatient Medications on File Prior to Visit  Medication Sig Dispense Refill  . acetaminophen (TYLENOL) 500 MG tablet Take 500-1,000 mg by mouth every 6 (six) hours as needed for mild pain or headache.    Marland Kitchen atenolol (TENORMIN) 50 MG tablet TAKE ONE (1) TABLET EACH DAY 90 tablet 1  . atorvastatin (LIPITOR) 20 MG tablet Take 1 tablet (20 mg total) by mouth daily. 30 tablet 0  . clopidogrel (PLAVIX) 75 MG tablet Take 1 tablet (75 mg total) by mouth daily. 30 tablet 0  . losartan (COZAAR) 50 MG tablet Take 1 tablet (50 mg total) by mouth daily. 30 tablet 0  . sucralfate (CARAFATE)  1 g tablet Take 1 tablet (1 g total) by mouth in the morning and at bedtime. 30 tablet 0  . vitamin B-12 (CYANOCOBALAMIN) 1000 MCG tablet Take 1 tablet (1,000 mcg total) by mouth daily. 30 tablet 0   No current facility-administered medications on file prior to visit.    Allergies:  No Known Allergies    OBJECTIVE:  Physical Exam  Vitals:   04/08/20 1301  BP: (!) 143/82  Pulse: 65  Weight: 196 lb (88.9 kg)  Height: 5\' 11"  (1.803 m)   Body mass index is 27.34 kg/m. No exam data present  Post stroke PHQ 2/9 Depression screen PHQ 2/9 04/08/2020  Decreased Interest 0  Down, Depressed, Hopeless 0  PHQ - 2 Score 0     General: well developed, well nourished,  very pleasant elderly Caucasian male, seated, in no evident distress Head: head normocephalic and atraumatic.   Neck: supple with no carotid or supraclavicular bruits Cardiovascular: regular rate and rhythm, no murmurs Musculoskeletal: no deformity Skin:  no rash/petichiae Vascular:  Normal pulses all extremities   Neurologic Exam Mental Status: Awake and fully alert.   Fluent speech and language.  Oriented to place and time. Recent and remote memory intact. Attention span, concentration and fund of knowledge appropriate. Mood and affect appropriate.  Cranial Nerves: Fundoscopic exam reveals sharp disc margins. Pupils equal, briskly reactive to  light. Extraocular movements full without nystagmus. Visual fields full to confrontation. Hearing intact. Facial sensation intact. Face, tongue, palate moves normally and symmetrically.  Motor: Normal bulk and tone and strength left upper and lower extremity.  RUE 4/5 with decreased hand grip and tone; RLE: 4/5 hip flexor and ankle dorsiflexion only otherwise 5/5 Sensory.:  Lack of light touch, vibratory and pinprick sensation b/l ankle down with decreased sensation b/l knee to ankle and b/l hands distally Coordination: Rapid alternating movements normal in all extremities except right hand. Finger-to-nose and heel-to-shin performed accurately bilaterally with only mild incoordination of right side. Gait and Station: Arises from chair without difficulty. Stance is normal. Gait demonstrates normal stride length with mild imbalance/unsteadiness greater with making turns and occasional dragging/catching of right foot with use of rolling [place].  Tandem walk and heel toe not attempted Reflexes: 1+ LUE and LLE; brisk RUE and RLE. Toes downgoing.        NIHSS 1 1a.  Level of consciousness 0 1b. LOC questions 0 1c. LOC commands 0 2.  Best gaze 0 3.  Visual 0 4.  Facial palsy 0 5a.  Motor arm-left 0 5b.  Motor arm-right 0 6a.  Motor leg-left 0 6b.  Motor leg-right 0 7.  Limb ataxia 1 8.  Sensory 0 stroke related 9.  Best language 0 10.  Dysarthria 0 11.  Extinction and inattention 0  Modified Rankin  2-3     ASSESSMENT: Hector [name] is a 79 y.o. 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 Vascular risk factors include HTN, HLD, advanced age, former tobacco use and EtOH use.  Reports 15-year history of progressive b/l upper and lower extremity neuropathy with paresthesias     PLAN:  1. L pontine stroke:  a. Residual  deficit: Mild left hemiparesis with incoordination and gait impairment. Continue OP PT/OT and likely ongoing improvement  b. continue clopidogrel 75 mg daily  and atorvastatin 20 mg daily for secondary stroke prevention.  c. Discussed  secondary stroke prevention measures and importance of close PCP follow up for aggressive stroke risk factor management  2. HTN: BP goal <130/90.  Stable on losartan and atenolol per PCP 3. HLD: LDL goal <70. Recent LDL 77.  Initiated atorvastatin 20 mg daily during stroke admission.  Repeat lipid panel today per daughter's request 4. Neuropathy, idiopathic:  a. Unknown etiology not previously evaluated.  Reports family history of neuropathy b. Progressive 15-year history c. Initiated gabapentin 300 mg twice daily as needed for paresthesias interfering with daily activity or sleep.  Discussed potential side effects and patient and daughter wishing to proceed d. Rule out reversible causes with neuropathy labs e. May consider EMG/NCV which may be discussed at follow-up visit    Follow up in 3 months or call earlier if needed   CC:  [place] provider: Dr. Leonie Man PCP: Alycia Rossetti, MD (will fax as not available via epic fax# [phone])   I spent 60 minutes of face-to-face and non-face-to-face time with patient and daughter.  This included previsit chart review including recent hospitalization pertinent progress notes, lab work and imaging, lab review, study review, order entry, electronic health record documentation, patient education regarding recent stroke including etiology, residual deficits, importance of managing stroke risk factors, chronic neuropathy and further evaluation and answered all other questions to patient and daughters satisfaction   Frann Rider, AGNP-BC  [facility] [address] [place],  [address]  Phone [phone] Fax [phone] Note: This document was prepared with digital dictation and  possible smart phrase technology. Any transcriptional errors that result from this process are unintentional.

## 2020-04-08 NOTE — Patient Instructions (Addendum)
Continue working with physical and Occupational Therapy for likely ongoing improvement  Continue clopidogrel 75 mg daily  and atorvastatin for secondary stroke prevention  Continue to follow up with PCP regarding cholesterol and blood pressure management  Maintain strict control of hypertension with blood pressure goal below 130/90 and cholesterol with LDL cholesterol (bad cholesterol) goal below 70 mg/dL.   Recommend starting gabapentin 300 mg twice daily as needed for neuropathy pain We will further look into underlying causes of your neuropathy with lab work and consider EMG/NCV in the future if indicated     Followup in the future with me in 3 months or call earlier if needed      Thank you for coming to see [place] at [facility]. I hope we have been able to provide you high quality care today.  You may receive a patient satisfaction survey over the next few weeks. We would appreciate your feedback and comments so that we may continue to improve ourselves and the health of our patients.    Neuropathic Pain Neuropathic pain is pain caused by damage to the nerves that are responsible for certain sensations in your body (sensory nerves). The pain can be caused by:  Damage to the sensory nerves that send signals to your spinal cord and brain (peripheral nervous system).  Damage to the sensory nerves in your brain or spinal cord (central nervous system). Neuropathic pain can make you more sensitive to pain. Even a minor sensation can feel very painful. This is usually a long-term condition that can be difficult to treat. The type of pain differs from person to person. It may:  Start suddenly (acute), or it may develop slowly and last for a long time (chronic).  Come and go as damaged nerves heal, or it may stay at the same level for years.  Cause emotional distress, loss of sleep, and a lower quality of life. What are the causes? The most common cause of this  condition is diabetes. Many other diseases and conditions can also cause neuropathic pain. Causes of neuropathic pain can be classified as:  Toxic. This is caused by medicines and chemicals. The most common cause of toxic neuropathic pain is damage from cancer treatments (chemotherapy).  Metabolic. This can be caused by: ? Diabetes. This is the most common disease that damages the nerves. ? Lack of vitamin B from long-term alcohol abuse.  Traumatic. Any injury that cuts, crushes, or stretches a nerve can cause damage and pain. A common example is feeling pain after losing an arm or leg (phantom limb pain).  Compression-related. If a sensory nerve gets trapped or compressed for a long period of time, the blood supply to the nerve can be cut off.  Vascular. Many blood vessel diseases can cause neuropathic pain by decreasing blood supply and oxygen to nerves.  Autoimmune. This type of pain results from diseases in which the body's defense system (immune system) mistakenly attacks sensory nerves. Examples of autoimmune diseases that can cause neuropathic pain include lupus and multiple sclerosis.  Infectious. Many types of viral infections can damage sensory nerves and cause pain. Shingles infection is a common cause of this type of pain.  Inherited. Neuropathic pain can be a symptom of many diseases that are passed down through families (genetic). What increases the risk? You are more likely to develop this condition if:  You have diabetes.  You smoke.  You drink too much alcohol.  You are taking certain medicines, including medicines that kill  cancer cells (chemotherapy) or that treat immune system disorders. What are the signs or symptoms? The main symptom is pain. Neuropathic pain is often described as:  Burning.  Shock-like.  Stinging.  Hot or cold.  Itching. How is this diagnosed? No single test can diagnose neuropathic pain. It is diagnosed based on:  Physical exam and  your symptoms. Your health care provider will ask you about your pain. You may be asked to use a pain scale to describe how bad your pain is.  Tests. These may be done to see if you have a high sensitivity to pain and to help find the cause and location of any sensory nerve damage. They include: ? Nerve conduction studies to test how well nerve signals travel through your sensory nerves (electrodiagnostic testing). ? Stimulating your sensory nerves through electrodes on your skin and measuring the response in your spinal cord and brain (somatosensory evoked potential).  Imaging studies, such as: ? X-rays. ? CT scan. ? MRI. How is this treated? Treatment for neuropathic pain may change over time. You may need to try different treatment options or a combination of treatments. Some options include:  Treating the underlying cause of the neuropathy, such as diabetes, kidney disease, or vitamin deficiencies.  Stopping medicines that can cause neuropathy, such as chemotherapy.  Medicine to relieve pain. Medicines may include: ? Prescription or over-the-counter pain medicine. ? Anti-seizure medicine. ? Antidepressant medicines. ? Pain-relieving patches that are applied to painful areas of skin. ? A medicine to numb the area (local anesthetic), which can be injected as a nerve block.  Transcutaneous nerve stimulation. This uses electrical currents to block painful nerve signals. The treatment is painless.  Alternative treatments, such as: ? Acupuncture. ? Meditation. ? Massage. ? Physical therapy. ? Pain management programs. ? Counseling. Follow these instructions at home: Medicines   Take over-the-counter and prescription medicines only as told by your health care provider.  Do not drive or use heavy machinery while taking prescription pain medicine.  If you are taking prescription pain medicine, take actions to prevent or treat constipation. Your health care provider may recommend  that you: ? Drink enough fluid to keep your urine pale yellow. ? Eat foods that are high in fiber, such as fresh fruits and vegetables, whole grains, and beans. ? Limit foods that are high in fat and processed sugars, such as fried or sweet foods. ? Take an over-the-counter or prescription medicine for constipation. Lifestyle   Have a good support system at home.  Consider joining a chronic pain support group.  Do not use any products that contain nicotine or tobacco, such as cigarettes and e-cigarettes. If you need help quitting, ask your health care provider.  Do not drink alcohol. General instructions  Learn as much as you can about your condition.  Work closely with all your health care providers to find the treatment plan that works best for you.  Ask your health care provider what activities are safe for you.  Keep all follow-up visits as told by your health care provider. This is important. Contact a health care provider if:  Your pain treatments are not working.  You are having side effects from your medicines.  You are struggling with tiredness (fatigue), mood changes, depression, or anxiety. Summary  Neuropathic pain is pain caused by damage to the nerves that are responsible for certain sensations in your body (sensory nerves).  Neuropathic pain may come and go as damaged nerves heal, or it may stay  at the same level for years.  Neuropathic pain is usually a long-term condition that can be difficult to treat. Consider joining a chronic pain support group. This information is not intended to replace advice given to you by your health care provider. Make sure you discuss any questions you have with your health care provider. Document Revised: 08/15/2018 Document Reviewed: 05/11/2017 Elsevier Patient Education  [employer].

## 2020-04-08 NOTE — Progress Notes (Signed)
Todays visit notes have been faxed to Dr. Alycia Rossetti @ [phone]. Confirmation received.

## 2020-04-09 LAB — LIPID PANEL
Chol/HDL Ratio: 3 ratio (ref 0.0–5.0)
Cholesterol, Total: 115 mg/dL (ref 100–199)
HDL: 38 mg/dL — ABNORMAL LOW (ref >39)
LDL Chol Calc (NIH): 54 mg/dL (ref 0–99)
Triglycerides: 127 mg/dL (ref 0–149)
VLDL Cholesterol Cal: 23 mg/dL (ref 5–40)

## 2020-04-09 LAB — ANA W/REFLEX: Anti Nuclear Antibody (ANA): NEGATIVE

## 2020-04-09 LAB — HIV ANTIBODY (ROUTINE TESTING W REFLEX): HIV Screen 4th Generation wRfx: NONREACTIVE

## 2020-04-09 LAB — SJOGREN'S SYNDROME ANTIBODS(SSA + SSB)
ENA SSA (RO) Ab: <0.2 AI (ref 0.0–0.9)
ENA SSB (LA) Ab: <0.2 AI (ref 0.0–0.9)

## 2020-04-09 LAB — RHEUMATOID FACTOR: Rheumatoid fact SerPl-aCnc: <10 IU/mL (ref <14.0)

## 2020-04-09 LAB — TSH: TSH: 1.61 u[IU]/mL (ref 0.450–4.500)

## 2020-04-09 LAB — VITAMIN B12: Vitamin B-12: 496 pg/mL (ref 232–1245)

## 2020-04-09 LAB — ANGIOTENSIN CONVERTING ENZYME: Angio Convert Enzyme: 61 U/L (ref 14–82)

## 2020-04-09 LAB — SEDIMENTATION RATE: Sed Rate: 2 mm/hr (ref 0–30)

## 2020-04-12 ENCOUNTER — Telehealth: Payer: Self-pay

## 2020-04-12 NOTE — Telephone Encounter (Signed)
-----   Message from Frann Rider, NP sent at 04/12/2020  8:15 AM EST ----- Please advise patient that his recent lab work largely satisfactory without evidence of reversible causes of neuropathy. Lipid panel showed improvement of LDL currently at 54 with goal less than 70

## 2020-04-12 NOTE — Telephone Encounter (Signed)
Pt was contacted  Through his wifes cell number. Pt was notified of the message below. Pt verbalized understanding

## 2020-04-16 ENCOUNTER — Encounter: Payer: Medicare PPO | Admitting: Physical Medicine & Rehabilitation

## 2020-04-19 ENCOUNTER — Ambulatory Visit: Payer: Self-pay | Admitting: Family Medicine

## 2020-05-13 ENCOUNTER — Encounter: Payer: Medicare PPO | Admitting: Physical Medicine & Rehabilitation

## 2020-06-01 ENCOUNTER — Encounter: Payer: Self-pay | Admitting: Physical Medicine & Rehabilitation

## 2020-06-01 ENCOUNTER — Encounter: Payer: Medicare PPO | Attending: Registered Nurse | Admitting: Physical Medicine & Rehabilitation

## 2020-06-01 ENCOUNTER — Other Ambulatory Visit: Payer: Self-pay

## 2020-06-01 VITALS — BP 168/84 | HR 61 | Temp 97.8°F | Ht 71.0 in | Wt 196.6 lb

## 2020-06-01 DIAGNOSIS — I639 Cerebral infarction, unspecified: Secondary | ICD-10-CM | POA: Insufficient documentation

## 2020-06-01 NOTE — Patient Instructions (Addendum)
EMG/ Nerve conduction study to evaluate neuropathy, ask your primary MD to refer you to a Dr in [facility]  Please increase losartan to 50mg  per day until you see your MD in March  CHeck blood pressure at least once a day if you get dizzy or blood pressure drops below 631 systolic go back to the 25mg  dose of losartan   Advise no driving due to neuropathy

## 2020-06-01 NOTE — Progress Notes (Signed)
Subjective:    Patient ID: Hector [name], male    DOB: 20-Feb-1941, 80 y.o.   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-completed CIR 03/11/2020 HPI Pt now walking with cane , has completed OP PT and OT, now doing home exercise program  Right side still a little weak unable to close R hand completely  Mod I with all activity   Discussed PCP visit , has MD in [facility] cancelled has next appt in March BP up today and at home has been running in 564P, diastolics have run in 32R at home  Has seen Neuro, lab work for neuropathy W/u negative, feet are very numb and this has affected his ability to manage brake and accelerator pedals in his car  Has completed OP PT, OT Pain Inventory Average Pain 0 Pain Right Now 0 My pain is no pain  In the last 24 hours, has pain interfered with the following? General activity 0 Relation with others 0 Enjoyment of life 0 What TIME of day is your pain at its worst? no pain Sleep (in general) Good  Pain is worse with: no pain Pain improves with: no pain Relief from Meds: no pain  Family History  Problem Relation Age of Onset  . Stroke Mother 25  . Heart disease Father 30  . Cancer Paternal Uncle        Carcinoma of unknown origin.  . Colon cancer Neg Hx    Social History   Socioeconomic History  . Marital status: Married    Spouse name: Not on file  . Number of children: 2  . Years of education: Not on file  . Highest education level: Some college, no degree  Occupational History  . Not on file  Tobacco Use  . Smoking status: Former Smoker    Quit date: 07/25/1988    Years since quitting: 31.8  . Smokeless tobacco: Never Used  Vaping Use  . Vaping Use: Never used  Substance and Sexual Activity  . Alcohol use: Yes    Alcohol/week: 7.0 standard drinks    Types: 7 drink(s) per week  . Drug use: No  . Sexual activity: Not on file  Other Topics Concern  . Not on file  Social History Narrative  . Not on file   Social  Determinants of Health   Financial Resource Strain: Not on file  Food Insecurity: Not on file  Transportation Needs: Not on file  Physical Activity: Not on file  Stress: Not on file  Social Connections: Not on file   Past Surgical History:  Procedure Laterality Date  . APPENDECTOMY  02/25/11  . COLONOSCOPY    . LOBECTOMY    . TRANSURETHRAL RESECTION OF PROSTATE  2004   Past Surgical History:  Procedure Laterality Date  . APPENDECTOMY  02/25/11  . COLONOSCOPY    . LOBECTOMY    . TRANSURETHRAL RESECTION OF PROSTATE  2004   Past Medical History:  Diagnosis Date  . Diverticulitis 04/2012   admission [facility]  . Former smoker    Smoked for 20 yrs;  Quit  25 yrs ago.  Marland Kitchen GERD (gastroesophageal reflux disease)   . Hypertension   . Hypokalemia   . Lung cancer ([facility])   . 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 Polycythemia   . Prostatitis    S/P  TURP   BP (!) 168/84   Pulse 61   Temp 97.8 F (36.6 C)   Ht 5\' 11"  (1.803 m)  Wt 196 lb 9.6 oz (89.2 kg)   SpO2 98%   BMI 27.42 kg/m   Opioid Risk Score:   Fall Risk Score:  `1  Depression screen PHQ 2/9  Depression screen [facility] 2/9 04/08/2020 02/01/2016 07/27/2015 01/26/2015 08/18/2014 02/05/2014  Decreased Interest 0 0 0 0 0 0  Down, Depressed, Hopeless 0 0 0 0 0 0  PHQ - 2 Score 0 0 0 0 0 0    Review of Systems  Neurological: Positive for weakness.  All other systems reviewed and are negative.      Objective:   Physical Exam Vitals and nursing note reviewed.  Constitutional:      General: He is not in acute distress.    Appearance: He is normal weight.  HENT:     Head: Normocephalic and atraumatic.  Eyes:     Extraocular Movements: Extraocular movements intact.     Conjunctiva/sclera: Conjunctivae normal.     Pupils: Pupils are equal, round, and reactive to light.  Cardiovascular:     Rate and Rhythm: Normal rate and regular rhythm.     Heart sounds: Normal heart sounds. No murmur  heard.   Pulmonary:     Effort: Pulmonary effort is normal. No respiratory distress.     Breath sounds: Normal breath sounds. No wheezing.  Abdominal:     General: Abdomen is flat. Bowel sounds are normal. There is no distension.     Palpations: Abdomen is soft.     Tenderness: There is no abdominal tenderness.  Musculoskeletal:        General: No tenderness.  Skin:    General: Skin is warm and dry.  Neurological:     Mental Status: He is alert and oriented to person, place, and time.     Cranial Nerves: No dysarthria or facial asymmetry.     Sensory: Sensory deficit present.     Motor: No tremor or abnormal muscle tone.     Coordination: Coordination abnormal.     Gait: Gait normal.     Comments: Absent pinprick sensation in toes with the exception of left fifth digit Sensation absent at bilateral ankles and up to mid tibial area No evidence of atrophy in the foot muscles. Motor strength is 5/5 bilateral deltoid bicep tricep grip hip flexor knee extensor   Left ankle dorsiflexor, right ankle dorsiflexor is 4/5 There is decreased fine motor with finger to thumb opposition on the right side compared to the left side.  Ambulates without assistive device there is slightly increased base support but otherwise no gait abnormalities with standard gait.   Psychiatric:        Mood and Affect: Mood normal.        Behavior: Behavior normal.           Assessment & Plan:  #1.  Left pontine infarct has decreased fine motor on the right side in the upper extremity.  Otherwise has had an excellent recovery.  It is difficult to say whether his balance problems are more related to his peripheral neuropathy rather than to a stroke.  2.  Peripheral neuropathy no history of diabetes, family history on his father's side of neuropathy without diabetes, family history of neuropathy in mother side but she was a diabetic.  Lab work per neurology was negative.  We discussed the purpose of EMG/NCV  and this can be done in [facility] rather than traveling to [place] 3-1/2 hours.  He will discuss this with his primary physician  #3.  Hypertension his blood pressures have been elevated on the losartan 25 mg/day he was to discharge on the 50 mg losartan but for some reason he reduced to 25.  I instructed patient to increase to 50 mg of losartan and follow-up with his primary care physician in March.  If his blood pressures dropped to less than 102 systolic or he gets dizzy with standing he is to go back to 25 mg dose.  Follow-up physical medicine rehabilitation on a as needed basis

## 2020-07-08 ENCOUNTER — Ambulatory Visit: Payer: Medicare PPO | Admitting: Adult Health

## 2022-07-18 IMAGING — DX DG CHEST 1V PORT
1 series · 1 of 1 positions shown · non-contrast
Comparison: None.

CLINICAL DATA: Dizziness

EXAM:
PORTABLE CHEST 1 VIEW

[chest ap]
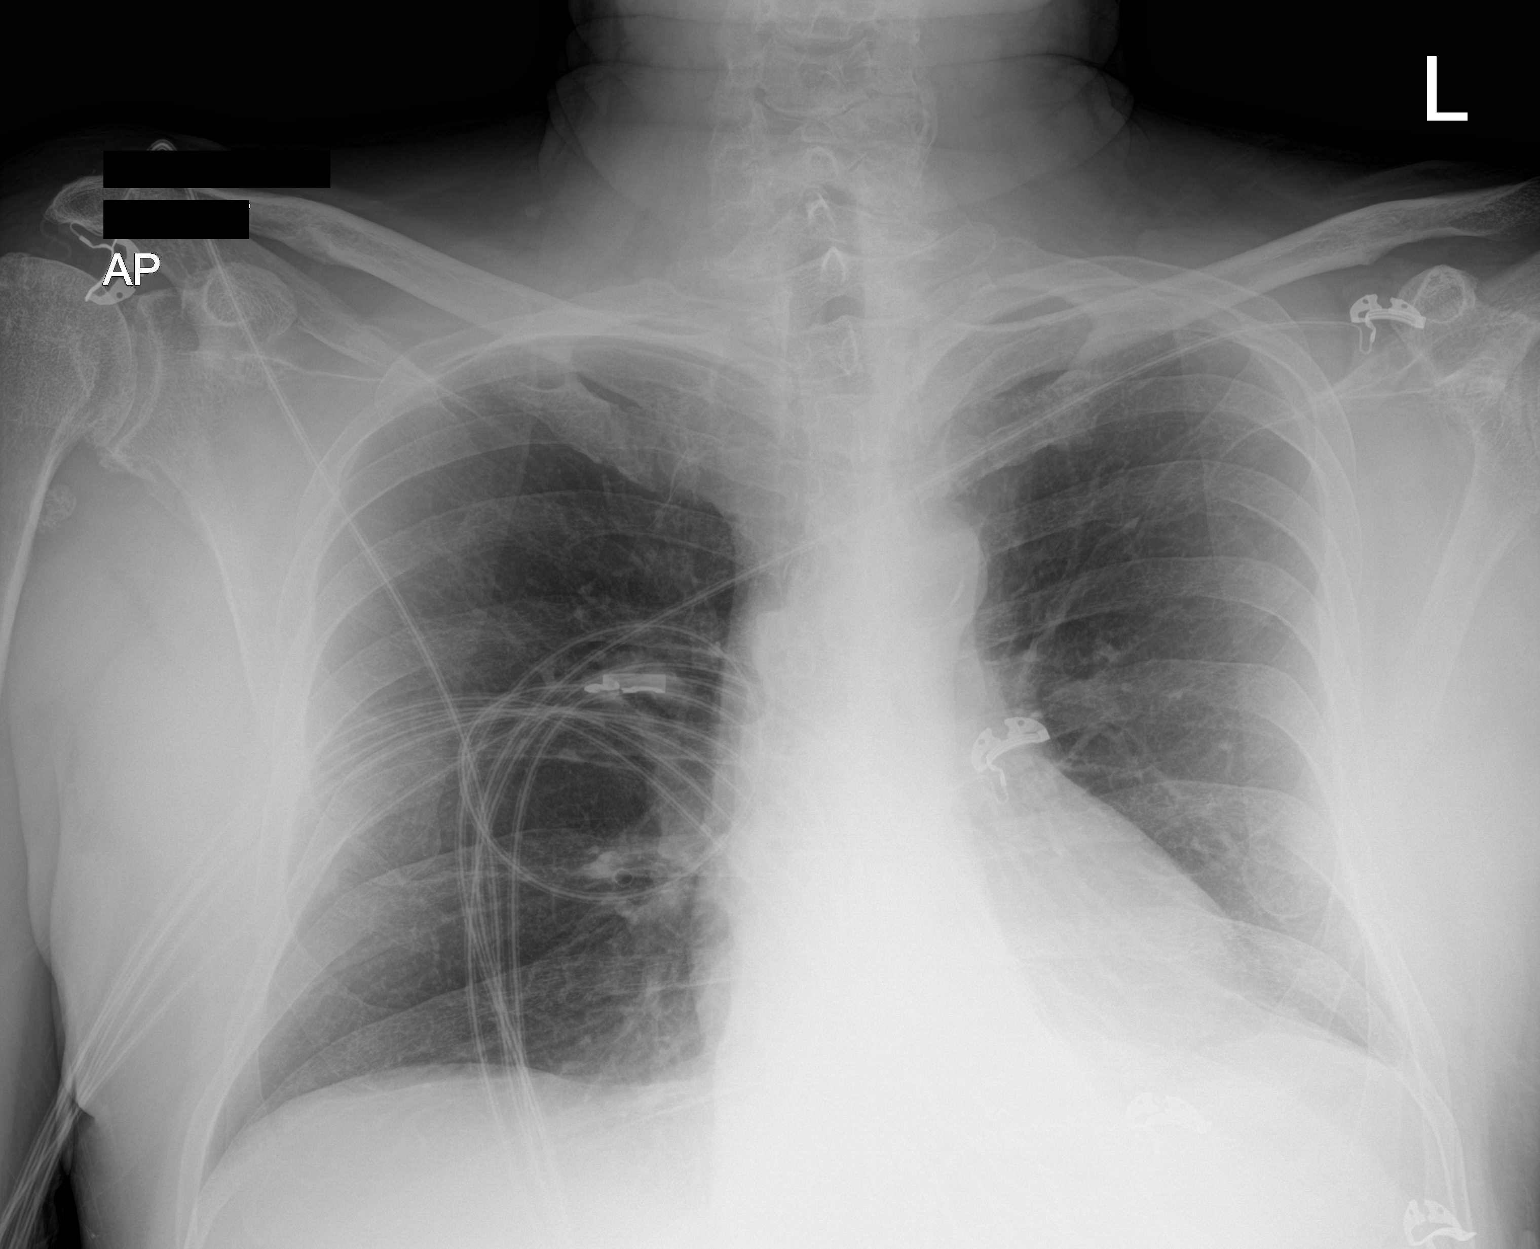

[1 of 1 positions shown; findings below may reference images not displayed]

FINDINGS: The heart size and mediastinal contours are within normal limits.
Both lungs are clear. The visualized skeletal structures are
unremarkable.
IMPRESSION: No active disease.

## 2022-07-19 IMAGING — CT CT ANGIO HEAD
1 of 11 series · 5 of 33 positions shown · IV contrast (omnipaque)
Comparison: None.

CLINICAL DATA: Stroke-like symptoms. Left facial numbness and
dizziness.

EXAM:
CT ANGIOGRAPHY HEAD AND NECK
TECHNIQUE: Multidetector CT imaging of the head and neck was performed using
the standard protocol during bolus administration of intravenous
contrast. Multiplanar CT image reconstructions and MIPs were
obtained to evaluate the vascular anatomy. Carotid stenosis
measurements (when applicable) are obtained utilizing NASCET
criteria, using the distal internal carotid diameter as the
denominator.
CONTRAST:  100mL OMNIPAQUE IOHEXOL 350 MG/ML SOLN

[Series 11: axial thin · axial · 0.45mm/px · z∈[+861,+1080]mm · 5 of 367 slices shown]
[im 62/367  soft-tissue]
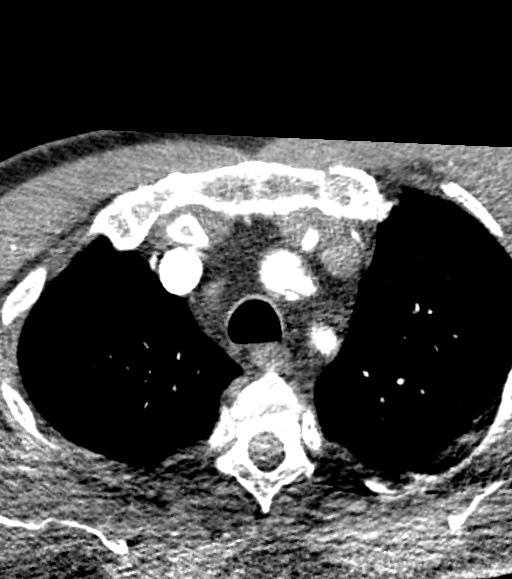
[im 123/367  bone]
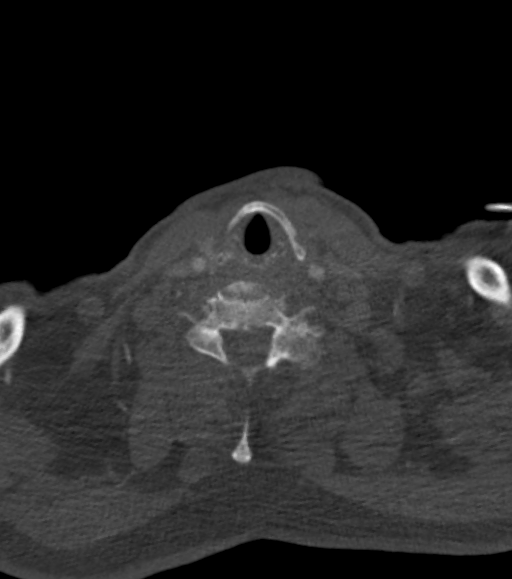
[im 184/367  soft-tissue]
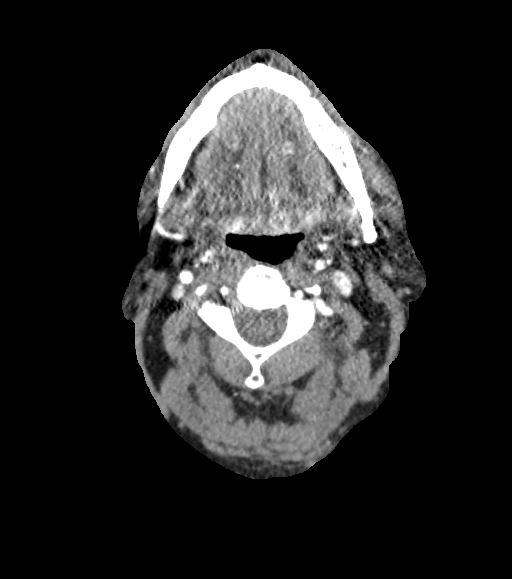
[im 245/367  bone]
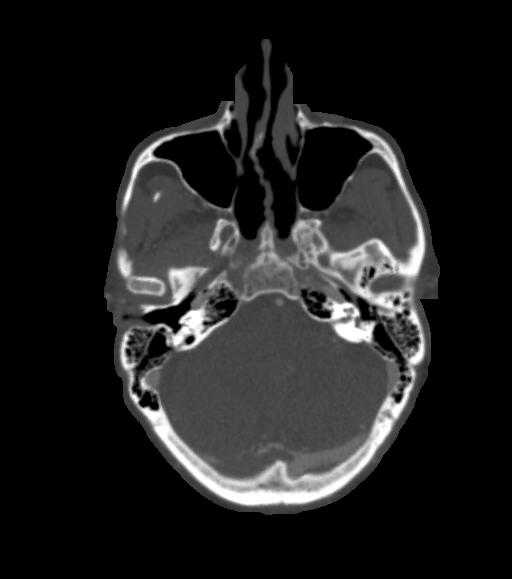
[im 306/367  soft-tissue]
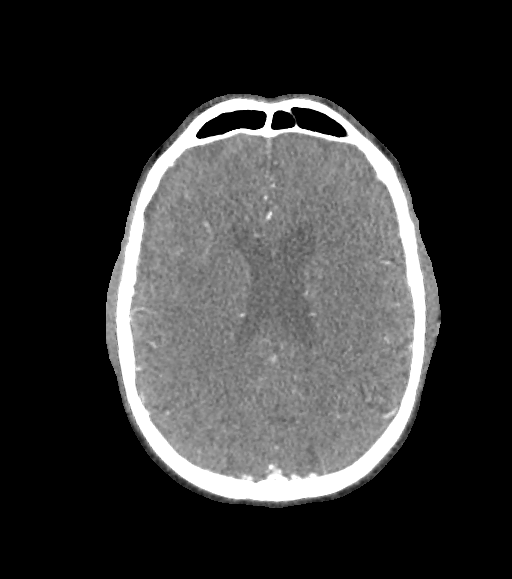

[5 of 33 positions shown; findings below may reference images not displayed]

FINDINGS: CT HEAD FINDINGS

Brain: There is no mass, hemorrhage or extra-axial collection. There
is generalized atrophy without lobar predilection. There is
hypoattenuation of the periventricular white matter, most commonly
indicating chronic ischemic microangiopathy.

Skull: The visualized skull base, calvarium and extracranial soft
tissues are normal.

Sinuses/Orbits: No fluid levels or advanced mucosal thickening of
the visualized paranasal sinuses. No mastoid or middle ear effusion.
The orbits are normal.

CTA NECK FINDINGS

SKELETON: There is no bony spinal canal stenosis. No lytic or
blastic lesion.

OTHER NECK: Normal pharynx, larynx and major salivary glands. No
cervical lymphadenopathy. Unremarkable thyroid gland.

UPPER CHEST: No pneumothorax or pleural effusion. No nodules or
masses.

AORTIC ARCH:

There is calcific atherosclerosis of the aortic arch. There is no
aneurysm, dissection or hemodynamically significant stenosis of the
visualized portion of the aorta. Conventional 3 vessel aortic
branching pattern. The visualized proximal subclavian arteries are
widely patent.

RIGHT CAROTID SYSTEM: Normal without aneurysm, dissection or
stenosis.

LEFT CAROTID SYSTEM: Normal without aneurysm, dissection or
stenosis.

VERTEBRAL ARTERIES: Left dominant configuration. Both origins are
clearly patent. There is no dissection, occlusion or flow-limiting
stenosis to the skull base (V1-V3 segments).

CTA HEAD FINDINGS

POSTERIOR CIRCULATION:

--Vertebral arteries: Normal V4 segments.

--Inferior cerebellar arteries: Normal.

--Basilar artery: Normal.

--Superior cerebellar arteries: Normal.

--Posterior cerebral arteries (PCA): Normal.

ANTERIOR CIRCULATION:

--Intracranial internal carotid arteries: Atherosclerotic
calcification of the internal carotid arteries at the skull base
without hemodynamically significant stenosis.

--Anterior cerebral arteries (ACA): Normal. Both A1 segments are
present. Patent anterior communicating artery (a-comm).

--Middle cerebral arteries (MCA): Normal.

VENOUS SINUSES: As permitted by contrast timing, patent.

ANATOMIC VARIANTS: Fetal origin of the right posterior cerebral
artery.

Review of the MIP images confirms the above findings.
IMPRESSION: 1. No emergent large vessel occlusion or hemodynamically significant
stenosis of the head or neck.
2. Chronic ischemic microangiopathy and generalized atrophy.

Aortic Atherosclerosis ([id number]-MT5.5).
